# Patient Record
Sex: Male | Born: 1950
Health system: Southern US, Community
[De-identification: ages and names within clinical notes are randomized; demographics above are authoritative.]

## PROBLEM LIST (undated history)

## (undated) DIAGNOSIS — J309 Allergic rhinitis, unspecified: Secondary | ICD-10-CM

## (undated) DIAGNOSIS — I872 Venous insufficiency (chronic) (peripheral): Secondary | ICD-10-CM

## (undated) DIAGNOSIS — K760 Fatty (change of) liver, not elsewhere classified: Secondary | ICD-10-CM

## (undated) DIAGNOSIS — T39395A Adverse effect of other nonsteroidal anti-inflammatory drugs [NSAID], initial encounter: Secondary | ICD-10-CM

## (undated) DIAGNOSIS — H9319 Tinnitus, unspecified ear: Secondary | ICD-10-CM

## (undated) DIAGNOSIS — R569 Unspecified convulsions: Secondary | ICD-10-CM

## (undated) DIAGNOSIS — G8929 Other chronic pain: Secondary | ICD-10-CM

## (undated) DIAGNOSIS — G40909 Epilepsy, unspecified, not intractable, without status epilepticus: Secondary | ICD-10-CM

## (undated) DIAGNOSIS — G894 Chronic pain syndrome: Secondary | ICD-10-CM

## (undated) DIAGNOSIS — K859 Acute pancreatitis without necrosis or infection, unspecified: Secondary | ICD-10-CM

## (undated) DIAGNOSIS — I10 Essential (primary) hypertension: Secondary | ICD-10-CM

## (undated) DIAGNOSIS — J189 Pneumonia, unspecified organism: Secondary | ICD-10-CM

## (undated) DIAGNOSIS — E119 Type 2 diabetes mellitus without complications: Secondary | ICD-10-CM

## (undated) DIAGNOSIS — M5136 Other intervertebral disc degeneration, lumbar region: Secondary | ICD-10-CM

## (undated) DIAGNOSIS — N182 Chronic kidney disease, stage 2 (mild): Secondary | ICD-10-CM

## (undated) DIAGNOSIS — E1142 Type 2 diabetes mellitus with diabetic polyneuropathy: Secondary | ICD-10-CM

## (undated) DIAGNOSIS — F329 Major depressive disorder, single episode, unspecified: Secondary | ICD-10-CM

## (undated) DIAGNOSIS — N209 Urinary calculus, unspecified: Secondary | ICD-10-CM

## (undated) DIAGNOSIS — K5790 Diverticulosis of intestine, part unspecified, without perforation or abscess without bleeding: Secondary | ICD-10-CM

## (undated) DIAGNOSIS — E782 Mixed hyperlipidemia: Secondary | ICD-10-CM

## (undated) DIAGNOSIS — D472 Monoclonal gammopathy: Secondary | ICD-10-CM

## (undated) DIAGNOSIS — F419 Anxiety disorder, unspecified: Secondary | ICD-10-CM

## (undated) DIAGNOSIS — M51369 Other intervertebral disc degeneration, lumbar region without mention of lumbar back pain or lower extremity pain: Secondary | ICD-10-CM

## (undated) DIAGNOSIS — Z8669 Personal history of other diseases of the nervous system and sense organs: Secondary | ICD-10-CM

## (undated) DIAGNOSIS — M1990 Unspecified osteoarthritis, unspecified site: Secondary | ICD-10-CM

## (undated) DIAGNOSIS — M25569 Pain in unspecified knee: Secondary | ICD-10-CM

## (undated) DIAGNOSIS — F29 Unspecified psychosis not due to a substance or known physiological condition: Secondary | ICD-10-CM

## (undated) DIAGNOSIS — F32A Depression, unspecified: Secondary | ICD-10-CM

## (undated) DIAGNOSIS — K219 Gastro-esophageal reflux disease without esophagitis: Secondary | ICD-10-CM

## (undated) DIAGNOSIS — M549 Dorsalgia, unspecified: Secondary | ICD-10-CM

## (undated) DIAGNOSIS — M21371 Foot drop, right foot: Secondary | ICD-10-CM

## (undated) DIAGNOSIS — K2960 Other gastritis without bleeding: Secondary | ICD-10-CM

## (undated) HISTORY — DX: Personal history of other diseases of the nervous system and sense organs: Z86.69

## (undated) HISTORY — DX: Epilepsy, unspecified, not intractable, without status epilepticus: G40.909

## (undated) HISTORY — DX: Chronic kidney disease, stage 2 (mild): N18.2

## (undated) HISTORY — DX: Adverse effect of other nonsteroidal anti-inflammatory drugs (NSAID), initial encounter: T39.395A

## (undated) HISTORY — PX: BACK SURGERY: SHX140

## (undated) HISTORY — DX: Chronic pain syndrome: G89.4

## (undated) HISTORY — DX: Acute pancreatitis without necrosis or infection, unspecified: K85.90

## (undated) HISTORY — DX: Monoclonal gammopathy: D47.2

## (undated) HISTORY — DX: Mixed hyperlipidemia: E78.2

## (undated) HISTORY — DX: Foot drop, right foot: M21.371

## (undated) HISTORY — DX: Major depressive disorder, single episode, unspecified: F32.9

## (undated) HISTORY — PX: LUMBAR LAMINECTOMY: SHX95

## (undated) HISTORY — PX: OTHER SURGICAL HISTORY: SHX169

## (undated) HISTORY — DX: Other intervertebral disc degeneration, lumbar region without mention of lumbar back pain or lower extremity pain: M51.369

## (undated) HISTORY — DX: Other gastritis without bleeding: K29.60

## (undated) HISTORY — DX: Dorsalgia, unspecified: M54.9

## (undated) HISTORY — DX: Anxiety disorder, unspecified: F41.9

## (undated) HISTORY — DX: Fatty (change of) liver, not elsewhere classified: K76.0

## (undated) HISTORY — DX: Other intervertebral disc degeneration, lumbar region: M51.36

## (undated) HISTORY — DX: Venous insufficiency (chronic) (peripheral): I87.2

## (undated) HISTORY — DX: Type 2 diabetes mellitus with diabetic polyneuropathy: E11.42

## (undated) HISTORY — DX: Other chronic pain: G89.29

## (undated) HISTORY — DX: Unspecified osteoarthritis, unspecified site: M19.90

## (undated) HISTORY — DX: Allergic rhinitis, unspecified: J30.9

## (undated) HISTORY — DX: Tinnitus, unspecified ear: H93.19

## (undated) HISTORY — DX: Depression, unspecified: F32.A

## (undated) HISTORY — PX: COLONOSCOPY: SHX174

## (undated) HISTORY — DX: Type 2 diabetes mellitus without complications: E11.9

---
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
## 1998-04-16 ENCOUNTER — Inpatient Hospital Stay (HOSPITAL_COMMUNITY): Admission: RE | Admit: 1998-04-16 | Discharge: 1998-04-16 | Payer: Self-pay

## 2000-03-27 DIAGNOSIS — M21371 Foot drop, right foot: Secondary | ICD-10-CM

## 2000-03-27 DIAGNOSIS — R569 Unspecified convulsions: Secondary | ICD-10-CM

## 2000-03-27 DIAGNOSIS — G8929 Other chronic pain: Secondary | ICD-10-CM

## 2000-03-27 HISTORY — DX: Other chronic pain: G89.29

## 2000-03-27 HISTORY — DX: Unspecified convulsions: R56.9

## 2000-03-27 HISTORY — DX: Foot drop, right foot: M21.371

## 2000-08-17 ENCOUNTER — Emergency Department (HOSPITAL_COMMUNITY): Admission: EM | Admit: 2000-08-17 | Discharge: 2000-08-17 | Payer: Self-pay | Admitting: Internal Medicine

## 2000-08-23 ENCOUNTER — Emergency Department (HOSPITAL_COMMUNITY): Admission: EM | Admit: 2000-08-23 | Discharge: 2000-08-24 | Payer: Self-pay | Admitting: *Deleted

## 2000-09-20 ENCOUNTER — Emergency Department (HOSPITAL_COMMUNITY): Admission: EM | Admit: 2000-09-20 | Discharge: 2000-09-20 | Payer: Self-pay | Admitting: *Deleted

## 2000-10-27 ENCOUNTER — Emergency Department (HOSPITAL_COMMUNITY): Admission: EM | Admit: 2000-10-27 | Discharge: 2000-10-27 | Payer: Self-pay | Admitting: *Deleted

## 2000-10-27 ENCOUNTER — Encounter: Payer: Self-pay | Admitting: *Deleted

## 2000-11-13 ENCOUNTER — Ambulatory Visit (HOSPITAL_COMMUNITY): Admission: RE | Admit: 2000-11-13 | Discharge: 2000-11-13 | Payer: Self-pay

## 2000-11-26 ENCOUNTER — Emergency Department (HOSPITAL_COMMUNITY): Admission: EM | Admit: 2000-11-26 | Discharge: 2000-11-26 | Payer: Self-pay | Admitting: Emergency Medicine

## 2000-11-26 ENCOUNTER — Encounter: Payer: Self-pay | Admitting: *Deleted

## 2001-01-01 ENCOUNTER — Encounter (HOSPITAL_COMMUNITY): Admission: RE | Admit: 2001-01-01 | Discharge: 2001-01-31 | Payer: Self-pay | Admitting: Surgery

## 2001-01-18 ENCOUNTER — Ambulatory Visit (HOSPITAL_COMMUNITY): Admission: RE | Admit: 2001-01-18 | Discharge: 2001-01-18 | Payer: Self-pay | Admitting: Surgery

## 2001-04-05 ENCOUNTER — Ambulatory Visit (HOSPITAL_COMMUNITY): Admission: RE | Admit: 2001-04-05 | Discharge: 2001-04-05 | Payer: Self-pay | Admitting: Surgery

## 2001-04-05 ENCOUNTER — Encounter: Payer: Self-pay | Admitting: Surgery

## 2001-08-22 ENCOUNTER — Emergency Department (HOSPITAL_COMMUNITY): Admission: EM | Admit: 2001-08-22 | Discharge: 2001-08-22 | Payer: Self-pay | Admitting: Emergency Medicine

## 2002-05-06 ENCOUNTER — Emergency Department (HOSPITAL_COMMUNITY): Admission: EM | Admit: 2002-05-06 | Discharge: 2002-05-06 | Payer: Self-pay | Admitting: *Deleted

## 2005-01-04 ENCOUNTER — Emergency Department (HOSPITAL_COMMUNITY): Admission: EM | Admit: 2005-01-04 | Discharge: 2005-01-05 | Payer: Self-pay | Admitting: Emergency Medicine

## 2005-01-18 ENCOUNTER — Emergency Department (HOSPITAL_COMMUNITY): Admission: EM | Admit: 2005-01-18 | Discharge: 2005-01-18 | Payer: Self-pay | Admitting: Emergency Medicine

## 2006-10-28 ENCOUNTER — Emergency Department (HOSPITAL_COMMUNITY): Admission: EM | Admit: 2006-10-28 | Discharge: 2006-10-28 | Payer: Self-pay | Admitting: Emergency Medicine

## 2007-04-26 ENCOUNTER — Ambulatory Visit: Payer: Self-pay | Admitting: Internal Medicine

## 2007-04-26 DIAGNOSIS — G8929 Other chronic pain: Secondary | ICD-10-CM | POA: Insufficient documentation

## 2007-04-26 DIAGNOSIS — R569 Unspecified convulsions: Secondary | ICD-10-CM | POA: Insufficient documentation

## 2007-04-26 DIAGNOSIS — M545 Low back pain, unspecified: Secondary | ICD-10-CM | POA: Insufficient documentation

## 2007-04-26 DIAGNOSIS — I10 Essential (primary) hypertension: Secondary | ICD-10-CM | POA: Insufficient documentation

## 2007-04-26 DIAGNOSIS — F329 Major depressive disorder, single episode, unspecified: Secondary | ICD-10-CM | POA: Insufficient documentation

## 2007-04-29 ENCOUNTER — Encounter (INDEPENDENT_AMBULATORY_CARE_PROVIDER_SITE_OTHER): Payer: Self-pay | Admitting: Internal Medicine

## 2007-05-01 ENCOUNTER — Telehealth (INDEPENDENT_AMBULATORY_CARE_PROVIDER_SITE_OTHER): Payer: Self-pay | Admitting: Internal Medicine

## 2007-05-03 ENCOUNTER — Telehealth (INDEPENDENT_AMBULATORY_CARE_PROVIDER_SITE_OTHER): Payer: Self-pay | Admitting: Internal Medicine

## 2007-05-15 ENCOUNTER — Telehealth (INDEPENDENT_AMBULATORY_CARE_PROVIDER_SITE_OTHER): Payer: Self-pay | Admitting: *Deleted

## 2007-05-23 ENCOUNTER — Encounter (INDEPENDENT_AMBULATORY_CARE_PROVIDER_SITE_OTHER): Payer: Self-pay | Admitting: Internal Medicine

## 2007-05-23 ENCOUNTER — Telehealth (INDEPENDENT_AMBULATORY_CARE_PROVIDER_SITE_OTHER): Payer: Self-pay | Admitting: *Deleted

## 2007-05-29 ENCOUNTER — Encounter (INDEPENDENT_AMBULATORY_CARE_PROVIDER_SITE_OTHER): Payer: Self-pay | Admitting: Internal Medicine

## 2007-06-06 ENCOUNTER — Ambulatory Visit: Payer: Self-pay | Admitting: Internal Medicine

## 2007-06-11 ENCOUNTER — Telehealth (INDEPENDENT_AMBULATORY_CARE_PROVIDER_SITE_OTHER): Payer: Self-pay | Admitting: Internal Medicine

## 2007-06-11 ENCOUNTER — Encounter (INDEPENDENT_AMBULATORY_CARE_PROVIDER_SITE_OTHER): Payer: Self-pay | Admitting: Internal Medicine

## 2007-06-11 ENCOUNTER — Ambulatory Visit: Payer: Self-pay | Admitting: Pain Medicine

## 2007-06-17 ENCOUNTER — Ambulatory Visit: Payer: Self-pay | Admitting: Pain Medicine

## 2007-06-17 ENCOUNTER — Telehealth (INDEPENDENT_AMBULATORY_CARE_PROVIDER_SITE_OTHER): Payer: Self-pay | Admitting: *Deleted

## 2007-06-18 ENCOUNTER — Telehealth (INDEPENDENT_AMBULATORY_CARE_PROVIDER_SITE_OTHER): Payer: Self-pay | Admitting: *Deleted

## 2007-07-08 ENCOUNTER — Ambulatory Visit: Payer: Self-pay | Admitting: Pain Medicine

## 2007-08-21 ENCOUNTER — Ambulatory Visit: Payer: Self-pay | Admitting: Pain Medicine

## 2007-09-13 ENCOUNTER — Ambulatory Visit: Payer: Self-pay | Admitting: Internal Medicine

## 2007-10-31 ENCOUNTER — Ambulatory Visit: Payer: Self-pay | Admitting: Internal Medicine

## 2007-10-31 DIAGNOSIS — H6690 Otitis media, unspecified, unspecified ear: Secondary | ICD-10-CM | POA: Insufficient documentation

## 2007-10-31 DIAGNOSIS — J309 Allergic rhinitis, unspecified: Secondary | ICD-10-CM

## 2007-10-31 HISTORY — DX: Allergic rhinitis, unspecified: J30.9

## 2007-11-14 ENCOUNTER — Telehealth (INDEPENDENT_AMBULATORY_CARE_PROVIDER_SITE_OTHER): Payer: Self-pay | Admitting: *Deleted

## 2007-11-18 ENCOUNTER — Ambulatory Visit: Payer: Self-pay | Admitting: Internal Medicine

## 2007-11-18 DIAGNOSIS — H9319 Tinnitus, unspecified ear: Secondary | ICD-10-CM

## 2007-11-18 HISTORY — DX: Tinnitus, unspecified ear: H93.19

## 2008-04-28 ENCOUNTER — Encounter (INDEPENDENT_AMBULATORY_CARE_PROVIDER_SITE_OTHER): Payer: Self-pay | Admitting: Internal Medicine

## 2008-06-29 ENCOUNTER — Ambulatory Visit: Payer: Self-pay | Admitting: Internal Medicine

## 2008-06-30 ENCOUNTER — Encounter (INDEPENDENT_AMBULATORY_CARE_PROVIDER_SITE_OTHER): Payer: Self-pay | Admitting: Internal Medicine

## 2008-06-30 LAB — CONVERTED CEMR LAB
CO2: 26 meq/L (ref 19–32)
Calcium: 9.6 mg/dL (ref 8.4–10.5)
Cholesterol: 283 mg/dL — ABNORMAL HIGH (ref 0–200)
HDL: 46 mg/dL (ref >39)
Sodium: 140 meq/L (ref 135–145)
Total CHOL/HDL Ratio: 6.2

## 2008-09-25 ENCOUNTER — Encounter (INDEPENDENT_AMBULATORY_CARE_PROVIDER_SITE_OTHER): Payer: Self-pay | Admitting: Internal Medicine

## 2009-06-28 ENCOUNTER — Encounter (HOSPITAL_COMMUNITY): Admission: RE | Admit: 2009-06-28 | Discharge: 2009-07-28 | Payer: Self-pay | Admitting: Geriatric Medicine

## 2009-09-17 ENCOUNTER — Emergency Department (HOSPITAL_COMMUNITY): Admission: EM | Admit: 2009-09-17 | Discharge: 2009-09-17 | Payer: Self-pay | Admitting: Emergency Medicine

## 2011-01-09 LAB — CBC
Hemoglobin: 14.4
MCHC: 34.3
MCV: 96.7
RBC: 4.35

## 2011-01-09 LAB — DIFFERENTIAL
Basophils Relative: 0
Monocytes Absolute: 0.6
Monocytes Relative: 9
Neutro Abs: 3.9

## 2011-01-09 LAB — URINE CULTURE

## 2011-01-09 LAB — BASIC METABOLIC PANEL
CO2: 31
Calcium: 8.9
Chloride: 100
GFR calc Af Amer: >60
Potassium: 4
Sodium: 137

## 2011-01-09 LAB — URINALYSIS, ROUTINE W REFLEX MICROSCOPIC
Bilirubin Urine: NEGATIVE
Glucose, UA: NEGATIVE
Protein, ur: NEGATIVE

## 2011-04-28 DIAGNOSIS — T39395A Adverse effect of other nonsteroidal anti-inflammatory drugs [NSAID], initial encounter: Secondary | ICD-10-CM

## 2011-04-28 DIAGNOSIS — K2960 Other gastritis without bleeding: Secondary | ICD-10-CM

## 2011-04-28 HISTORY — DX: Other gastritis without bleeding: T39.395A

## 2011-04-28 HISTORY — DX: Other gastritis without bleeding: K29.60

## 2011-05-17 ENCOUNTER — Encounter (HOSPITAL_COMMUNITY): Payer: Self-pay

## 2011-05-17 ENCOUNTER — Emergency Department (HOSPITAL_COMMUNITY)
Admission: EM | Admit: 2011-05-17 | Discharge: 2011-05-17 | Disposition: A | Discharge status: Home / Self Care | Payer: Medicare Other | Attending: Emergency Medicine | Admitting: Emergency Medicine

## 2011-05-17 DIAGNOSIS — I10 Essential (primary) hypertension: Secondary | ICD-10-CM | POA: Insufficient documentation

## 2011-05-17 DIAGNOSIS — K2960 Other gastritis without bleeding: Secondary | ICD-10-CM | POA: Insufficient documentation

## 2011-05-17 HISTORY — DX: Essential (primary) hypertension: I10

## 2011-05-17 LAB — CBC
HCT: 44.9 % (ref 39.0–52.0)
Hemoglobin: 16.3 g/dL (ref 13.0–17.0)
MCH: 33.1 pg (ref 26.0–34.0)
MCHC: 36.3 g/dL — ABNORMAL HIGH (ref 30.0–36.0)
MCV: 91.1 fL (ref 78.0–100.0)
RBC: 4.93 MIL/uL (ref 4.22–5.81)

## 2011-05-17 LAB — DIFFERENTIAL
Basophils Relative: 0 % (ref 0–1)
Eosinophils Absolute: 0 10*3/uL (ref 0.0–0.7)
Eosinophils Relative: 0 % (ref 0–5)
Lymphs Abs: 0.8 10*3/uL (ref 0.7–4.0)
Monocytes Absolute: 0.3 10*3/uL (ref 0.1–1.0)
Monocytes Relative: 4 % (ref 3–12)

## 2011-05-17 LAB — COMPREHENSIVE METABOLIC PANEL
Albumin: 4.5 g/dL (ref 3.5–5.2)
Alkaline Phosphatase: 72 U/L (ref 39–117)
BUN: 21 mg/dL (ref 6–23)
Creatinine, Ser: 1.11 mg/dL (ref 0.50–1.35)
GFR calc Af Amer: 83 mL/min — ABNORMAL LOW (ref >90)
Glucose, Bld: 178 mg/dL — ABNORMAL HIGH (ref 70–99)
Total Bilirubin: 0.6 mg/dL (ref 0.3–1.2)
Total Protein: 8.7 g/dL — ABNORMAL HIGH (ref 6.0–8.3)

## 2011-05-17 MED ORDER — ONDANSETRON 4 MG PO TBDP: 4.0000 mg | ORAL_TABLET | Freq: Three times a day (TID) | ORAL | Status: AC | PRN

## 2011-05-17 MED ORDER — ONDANSETRON HCL 4 MG/2ML IJ SOLN
INTRAMUSCULAR | Status: AC
  Administered 2011-05-17: 4 mg via INTRAVENOUS
  Filled 2011-05-17: qty 2

## 2011-05-17 MED ORDER — FENTANYL CITRATE 0.05 MG/ML IJ SOLN
100.0000 ug | Freq: Once | INTRAMUSCULAR | Status: AC
  Administered 2011-05-17: 100 ug via INTRAVENOUS
  Filled 2011-05-17: qty 2

## 2011-05-17 MED ORDER — ONDANSETRON HCL 4 MG/2ML IJ SOLN
4.0000 mg | Freq: Once | INTRAMUSCULAR | Status: AC
  Administered 2011-05-17: 4 mg via INTRAVENOUS

## 2011-05-17 MED ORDER — PANTOPRAZOLE SODIUM 40 MG IV SOLR
40.0000 mg | Freq: Once | INTRAVENOUS | Status: AC
  Administered 2011-05-17: 40 mg via INTRAVENOUS
  Filled 2011-05-17: qty 40

## 2011-05-17 MED ORDER — ONDANSETRON HCL 4 MG/2ML IJ SOLN
4.0000 mg | Freq: Once | INTRAMUSCULAR | Status: AC
  Administered 2011-05-17: 4 mg via INTRAVENOUS
  Filled 2011-05-17: qty 2

## 2011-05-17 MED ORDER — SODIUM CHLORIDE 0.9 % IV BOLUS (SEPSIS)
1000.0000 mL | Freq: Once | INTRAVENOUS | Status: AC
  Administered 2011-05-17: 1000 mL via INTRAVENOUS

## 2011-05-17 MED ORDER — SUCRALFATE 1 G PO TABS: 1.0000 g | ORAL_TABLET | Freq: Three times a day (TID) | ORAL | Status: DC

## 2011-05-17 MED ORDER — SUCRALFATE 1 G PO TABS
1.0000 g | ORAL_TABLET | Freq: Three times a day (TID) | ORAL | Status: DC
  Filled 2011-05-17 (×8): qty 1

## 2011-05-17 MED ORDER — PANTOPRAZOLE SODIUM 20 MG PO TBEC: 40.0000 mg | DELAYED_RELEASE_TABLET | Freq: Every day | ORAL | Status: DC

## 2011-05-17 NOTE — Discharge Instructions (Signed)
Gastritis °Gastritis is an inflammation (the body's way of reacting to injury and/or infection) of the stomach. It is often caused by viral or bacterial (germ) infections. It can also be caused by chemicals (including alcohol) and medications. This illness may be associated with generalized malaise (feeling tired, not well), cramps, and fever. The illness may last 2 to 7 days. If symptoms of gastritis continue, gastroscopy (looking into the stomach with a telescope-like instrument), biopsy (taking tissue samples), and/or blood tests may be necessary to determine the cause. Antibiotics will not affect the illness unless there is a bacterial infection present. One common bacterial cause of gastritis is an organism known as H. Pylori. This can be treated with antibiotics. Other forms of gastritis are caused by too much acid in the stomach. They can be treated with medications such as H2 blockers and antacids. Home treatment is usually all that is needed. Young children will quickly become dehydrated (loss of body fluids) if vomiting and diarrhea are both present. Medications may be given to control nausea. Medications are usually not given for diarrhea unless especially bothersome. Some medications slow the removal of the virus from the gastrointestinal tract. This slows down the healing process. °HOME CARE INSTRUCTIONS °Home care instructions for nausea and vomiting: °· For adults: drink small amounts of fluids often. Drink at least 2 quarts a day. Take sips frequently. Do not drink large amounts of fluid at one time. This may worsen the nausea.  °· Only take over-the-counter or prescription medicines for pain, discomfort, or fever as directed by your caregiver.  °· Drink clear liquids only. Those are anything you can see through such as water, broth, or soft drinks.  °· Once you are keeping clear liquids down, you may start full liquids, soups, juices, and ice cream or sherbet. Slowly add bland (plain, not spicy)  foods to your diet.  °Home care instructions for diarrhea: °· Diarrhea can be caused by bacterial infections or a virus. Your condition should improve with time, rest, fluids, and/or anti-diarrheal medication.  °· Until your diarrhea is under control, you should drink clear liquids often in small amounts. Clear liquids include: water, broth, jell-o water and weak tea.  °Avoid: °· Milk.  °· Fruits.  °· Tobacco.  °· Alcohol.  °· Extremely hot or cold fluids.  °· Too much intake of anything at one time.  °When your diarrhea stops you may add the following foods, which help the stool to become more formed: °· Rice.  °· Bananas.  °· Apples without skin.  °· Dry toast.  °Once these foods are tolerated you may add low-fat yogurt and low-fat cottage cheese. They will help to restore the normal bacterial balance in your bowel. °Wash your hands well to avoid spreading bacteria (germ) or virus. °SEEK IMMEDIATE MEDICAL CARE IF:  °· You are unable to keep fluids down.  °· Vomiting or diarrhea become persistent (constant).  °· Abdominal pain develops, increases, or localizes. (Right sided pain can be appendicitis. Left sided pain in adults can be diverticulitis.)  °· You develop a fever (an oral temperature above 102° F (38.9° C)).  °· Diarrhea becomes excessive or contains blood or mucus.  °· You have excessive weakness, dizziness, fainting or extreme thirst.  °· You are not improving or you are getting worse.  °· You have any other questions or concerns.  °Document Released: 03/07/2001 Document Revised: 11/23/2010 Document Reviewed: 03/13/2005 °ExitCare® Patient Information ©2012 [employer]. °

## 2011-05-17 NOTE — ED Provider Notes (Signed)
History     CSN: [id number]  Arrival date & time 05/17/11  1331   First MD Initiated Contact with Patient 05/17/11 1347      Chief Complaint  Patient presents with  . Nausea  . Emesis  . Diarrhea    (Consider location/radiation/quality/duration/timing/severity/associated sxs/prior treatment) HPI Patient presents with chief complaint of nausea and vomiting associated with epigastric discomfort which started around 6 to 7:00 this morning.  Patient had small amount of diarrhea but most of the complaint is nausea vomiting and epigastric discomfort.  Patient denies vomiting up blood.  Most of material he vomited consisted of bowel.  Patient has no past medical history of surgeries.  Patient denies fever or or chills.  Patient also denies history of ulcers, reflux, gastritis.  Patient denies alcohol intake.  Patient denies chest pain. Past Medical History  Diagnosis Date  . Hypertension     Past Surgical History  Procedure Date  . Back surgery     No family history on file.  History  Substance Use Topics  . Smoking status: Never Smoker   . Smokeless tobacco: Not on file  . Alcohol Use: No      Review of Systems  All other systems reviewed and are negative.    Allergies  Codeine  Home Medications   Current Outpatient Rx  Name Route Sig Dispense Refill  . DULOXETINE HCL 60 MG PO CPEP Oral Take 60 mg by mouth daily.    Marland Kitchen HYDROCHLOROTHIAZIDE 25 MG PO TABS Oral Take 25 mg by mouth daily.    Marland Kitchen LISINOPRIL 5 MG PO TABS Oral Take 5 mg by mouth daily.    Marland Kitchen METOPROLOL TARTRATE 100 MG PO TABS Oral Take 100 mg by mouth daily.    . MORPHINE SULFATE ER 15 MG PO TB12 Oral Take 15 mg by mouth 3 (three) times daily.    . OXYCODONE HCL 5 MG PO TABS Oral Take 5 mg by mouth every 8 (eight) hours as needed. FOR BREAK THROUGH PAIN    . ONDANSETRON 4 MG PO TBDP Oral Take 1 tablet (4 mg total) by mouth every 8 (eight) hours as needed for nausea. 20 tablet 0  . PANTOPRAZOLE SODIUM 20 MG PO  TBEC Oral Take 2 tablets (40 mg total) by mouth daily. 30 tablet 0  . SUCRALFATE 1 G PO TABS Oral Take 1 tablet (1 g total) by mouth 4 (four) times daily -  with meals and at bedtime. 20 tablet 0    BP 155/86  Pulse 100  Temp(Src) 98.3 F (36.8 C) (Oral)  Resp 20  Ht 5\' 11"  (1.803 m)  Wt 253 lb (114.76 kg)  BMI 35.29 kg/m2  SpO2 95%  Physical Exam  Nursing note and vitals reviewed. Constitutional: He is oriented to person, place, and time. He appears well-developed and well-nourished. No distress.  HENT:  Head: Normocephalic and atraumatic.  Eyes: Pupils are equal, round, and reactive to light.  Neck: Normal range of motion.  Cardiovascular: Normal rate and intact distal pulses.   Pulmonary/Chest: No respiratory distress.  Abdominal: Soft. Normal appearance and bowel sounds are normal. He exhibits no distension and no abdominal bruit. There is no tenderness.       Bedside ultrasound did not reveal any cholelithiasis.  Musculoskeletal: Normal range of motion.  Neurological: He is alert and oriented to person, place, and time. No cranial nerve deficit.  Skin: Skin is warm and dry. No rash noted.  Psychiatric: He has a normal mood  and affect. His behavior is normal.    ED Course  Procedures (including critical care time)   Scheduled Meds:    . fentaNYL  100 mcg Intravenous Once  . ondansetron  4 mg Intravenous Once  . ondansetron  4 mg Intravenous Once  . pantoprazole (PROTONIX) IV  40 mg Intravenous Once  . sodium chloride  1,000 mL Intravenous Once  . sucralfate  1 g Oral TID WC & HS     Labs Reviewed  CBC - Abnormal; Notable for the following:    MCHC 36.3 (*)    All other components within normal limits  DIFFERENTIAL - Abnormal; Notable for the following:    Neutrophils Relative 88 (*)    Neutro Abs 8.7 (*)    Lymphocytes Relative 8 (*)    All other components within normal limits  COMPREHENSIVE METABOLIC PANEL - Abnormal; Notable for the following:     Potassium 2.8 (*)    Chloride 94 (*)    Glucose, Bld 178 (*)    Total Protein 8.7 (*)    GFR calc non Af Amer 71 (*)    GFR calc Af Amer 83 (*)    All other components within normal limits  LIPASE, BLOOD   No results found.   1. Gastritis due to nonsteroidal anti-inflammatory drug       MDM         Nelia Shi, MD 05/17/11 [phone]

## 2011-05-17 NOTE — ED Notes (Signed)
Pt reports n/v/d since 0630 this a.m.  Pt reports abd pain to the center of his abdomen.

## 2011-10-17 ENCOUNTER — Encounter: Payer: Self-pay | Admitting: Family Medicine

## 2011-10-17 ENCOUNTER — Ambulatory Visit (INDEPENDENT_AMBULATORY_CARE_PROVIDER_SITE_OTHER): Payer: Medicare Other | Admitting: Family Medicine

## 2011-10-17 VITALS — BP 120/81 | HR 71 | Temp 97.4°F | Ht 71.0 in | Wt 249.0 lb

## 2011-10-17 DIAGNOSIS — F411 Generalized anxiety disorder: Secondary | ICD-10-CM

## 2011-10-17 DIAGNOSIS — M545 Low back pain, unspecified: Secondary | ICD-10-CM

## 2011-10-17 DIAGNOSIS — G8929 Other chronic pain: Secondary | ICD-10-CM

## 2011-10-17 DIAGNOSIS — M1710 Unilateral primary osteoarthritis, unspecified knee: Secondary | ICD-10-CM

## 2011-10-17 DIAGNOSIS — M170 Bilateral primary osteoarthritis of knee: Secondary | ICD-10-CM | POA: Insufficient documentation

## 2011-10-17 DIAGNOSIS — I10 Essential (primary) hypertension: Secondary | ICD-10-CM

## 2011-10-17 DIAGNOSIS — IMO0002 Reserved for concepts with insufficient information to code with codable children: Secondary | ICD-10-CM

## 2011-10-17 MED ORDER — LISINOPRIL 5 MG PO TABS: 5.0000 mg | ORAL_TABLET | Freq: Every day | ORAL | Status: DC

## 2011-10-17 MED ORDER — CLONAZEPAM 1 MG PO TABS: 1.0000 mg | ORAL_TABLET | Freq: Two times a day (BID) | ORAL | Status: DC | PRN
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
## 2011-10-17 NOTE — Assessment & Plan Note (Signed)
This has complicated his pain control, but recent adjustment of MS Contin to 15mg  tid has stabilized this and he usually takes only 1-2 breakthrough pain pills per day.  The plan is for him to get left TKR in the next 4-6 wks with Dr. Eulah Pont.
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
## 2011-10-17 NOTE — Assessment & Plan Note (Addendum)
Lots of extraneous circumstances in the last 1-2 yrs, hopefully going to get better soon. I increased his clonazepam to 1mg  dose, 1 bid prn, #60, RF x 1. Continue cymbalta.

## 2011-10-17 NOTE — Assessment & Plan Note (Signed)
Problem stable.  Continue current medications and diet appropriate for this condition.  We have reviewed our general long term plan for this problem and also reviewed symptoms and signs that should prompt the patient to call or return to the office. Restart ASA 81mg  qd for cardioprotection--take with food.  If dose of celebrex (or other NSAIDs) is increased, then we'll get him off of his ASA at least temporarily.

## 2011-10-30 ENCOUNTER — Ambulatory Visit: Payer: Medicare Other | Admitting: Family Medicine

## 2011-11-08 ENCOUNTER — Ambulatory Visit: Payer: Medicare Other | Admitting: Family Medicine

## 2011-11-14 ENCOUNTER — Telehealth: Payer: Self-pay | Admitting: Family Medicine

## 2011-11-14 DIAGNOSIS — E876 Hypokalemia: Secondary | ICD-10-CM

## 2011-11-14 DIAGNOSIS — R739 Hyperglycemia, unspecified: Secondary | ICD-10-CM

## 2011-11-14 NOTE — Telephone Encounter (Signed)
Pls contact pt and tell him I'm in the process of doing medical clearance for his upcoming knee surgery and I need him to come in and get a couple of blood tests to follow up the abnormalities he had in the ER in February this year--he needs a fasting BMET (lab visit only).  I'll place order.--thx

## 2011-11-15 ENCOUNTER — Telehealth: Payer: Self-pay | Admitting: Family Medicine

## 2011-11-15 NOTE — Telephone Encounter (Signed)
Patient needs referral to Dr Eulah Pont with [facility] for left knee replacement. Please contact patient about pain med contract he needs refills for pain meds.

## 2011-11-15 NOTE — Telephone Encounter (Signed)
Pt will have OV on Friday to discuss pain meds and pain contract.  Pt will need fasting BMET at that time.

## 2011-11-15 NOTE — Telephone Encounter (Signed)
Pt will have drawn at OV on Friday.

## 2011-11-17 ENCOUNTER — Ambulatory Visit (INDEPENDENT_AMBULATORY_CARE_PROVIDER_SITE_OTHER): Payer: Medicare Other | Admitting: Family Medicine

## 2011-11-17 ENCOUNTER — Ambulatory Visit: Payer: Medicare Other | Admitting: Family Medicine

## 2011-11-17 ENCOUNTER — Encounter: Payer: Self-pay | Admitting: *Deleted

## 2011-11-17 ENCOUNTER — Encounter: Payer: Self-pay | Admitting: Family Medicine

## 2011-11-17 VITALS — BP 112/72 | HR 70 | Ht 71.0 in | Wt 247.0 lb

## 2011-11-17 DIAGNOSIS — M170 Bilateral primary osteoarthritis of knee: Secondary | ICD-10-CM

## 2011-11-17 DIAGNOSIS — IMO0002 Reserved for concepts with insufficient information to code with codable children: Secondary | ICD-10-CM

## 2011-11-17 DIAGNOSIS — M1710 Unilateral primary osteoarthritis, unspecified knee: Secondary | ICD-10-CM

## 2011-11-17 DIAGNOSIS — E876 Hypokalemia: Secondary | ICD-10-CM

## 2011-11-17 DIAGNOSIS — I10 Essential (primary) hypertension: Secondary | ICD-10-CM

## 2011-11-17 LAB — CBC WITH DIFFERENTIAL/PLATELET
Basophils Relative: 0.8 % (ref 0.0–3.0)
HCT: 43.3 % (ref 39.0–52.0)
Hemoglobin: 14.4 g/dL (ref 13.0–17.0)
Lymphocytes Relative: 24.8 % (ref 12.0–46.0)
Lymphs Abs: 1.9 10*3/uL (ref 0.7–4.0)
Monocytes Relative: 11.7 % (ref 3.0–12.0)
Neutro Abs: 4.7 10*3/uL (ref 1.4–7.7)
RBC: 4.53 Mil/uL (ref 4.22–5.81)
RDW: 13.2 % (ref 11.5–14.6)

## 2011-11-17 LAB — BASIC METABOLIC PANEL
CO2: 34 mEq/L — ABNORMAL HIGH (ref 19–32)
Calcium: 9.7 mg/dL (ref 8.4–10.5)
GFR: 49.75 mL/min — ABNORMAL LOW (ref >60.00)
Glucose, Bld: 95 mg/dL (ref 70–99)
Potassium: 4.5 mEq/L (ref 3.5–5.1)
Sodium: 138 mEq/L (ref 135–145)

## 2011-11-17 MED ORDER — MORPHINE SULFATE ER 30 MG PO TBCR: EXTENDED_RELEASE_TABLET | ORAL | Status: DC

## 2011-11-17 MED ORDER — MORPHINE SULFATE ER 30 MG PO TBCR: 30.0000 mg | EXTENDED_RELEASE_TABLET | Freq: Two times a day (BID) | ORAL | Status: DC

## 2011-11-17 MED ORDER — HYDROCHLOROTHIAZIDE 25 MG PO TABS: 25.0000 mg | ORAL_TABLET | Freq: Every day | ORAL | Status: DC

## 2011-11-17 MED ORDER — OXYCODONE HCL 5 MG PO TABS: 5.0000 mg | ORAL_TABLET | Freq: Three times a day (TID) | ORAL | Status: DC | PRN

## 2011-11-17 MED ORDER — AMLODIPINE BESYLATE 10 MG PO TABS: 10.0000 mg | ORAL_TABLET | Freq: Every day | ORAL | Status: DC

## 2011-11-17 NOTE — Assessment & Plan Note (Signed)
Problem stable.  Continue current medications and diet appropriate for this condition.  We have reviewed our general long term plan for this problem and also reviewed symptoms and signs that should prompt the patient to call or return to the office. He had an acute GI illness 04/2011 and went to the ED, where a metabolic panel showed hypokalemia.  I'll recheck a metabolic panel today as well as a CBC.
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
## 2011-11-17 NOTE — Assessment & Plan Note (Signed)
Plus chronic pain in L-spine: pain contract signed today. Rx for MS contin 30 mg q8h, #90, no RF given to patient along with #30 of the 5mg  oxycodone for q6h prn breakthrough pain. The initial MS Contin rx I printed today had the wrong sig ("take bid") so I marked through this one and initialled it and printed a new rx with the correct sig. Hopefully he can ween down off of this some after his upcoming TKR.

## 2011-11-20 ENCOUNTER — Encounter: Payer: Self-pay | Admitting: Family Medicine

## 2011-12-01 ENCOUNTER — Telehealth: Payer: Self-pay | Admitting: Family Medicine

## 2011-12-01 NOTE — Telephone Encounter (Signed)
Patient said Richard Levine [name] has not received the medical release for his knee surgery, have we received any paperwork from their office? If not the patient's been talking to Robie Ridge at their office, she can send the paperwork.

## 2011-12-01 NOTE — Telephone Encounter (Signed)
Paperwork faxed, but I will fax again.

## 2011-12-04 ENCOUNTER — Other Ambulatory Visit: Payer: Self-pay | Admitting: *Deleted

## 2011-12-04 NOTE — Telephone Encounter (Signed)
Pt was given RX on 10/17/11 for #60 x 1.  Per Mellody Dance at [facility] pt filled/picked up on 10/17/11 and 11/09/11.  Refill is too early.  Refill denied with Mellody Dance.

## 2011-12-06 ENCOUNTER — Other Ambulatory Visit: Payer: Self-pay | Admitting: Family Medicine

## 2011-12-06 ENCOUNTER — Telehealth: Payer: Self-pay | Admitting: *Deleted

## 2011-12-06 MED ORDER — OXYCODONE HCL 5 MG PO TABS: ORAL_TABLET | ORAL | Status: DC

## 2011-12-06 NOTE — Telephone Encounter (Signed)
Pt is no longer taking Celebrex and wants to know if he should continue Protonix.  Pt is not having any GERD symptoms.  Please advise.  Pt's knee surgery will be 12/27/11.

## 2011-12-06 NOTE — Telephone Encounter (Signed)
He can stop protonix daily and just use it as needed.-thx

## 2011-12-07 ENCOUNTER — Other Ambulatory Visit: Payer: Self-pay | Admitting: *Deleted

## 2011-12-07 MED ORDER — CLONAZEPAM 1 MG PO TABS: 1.0000 mg | ORAL_TABLET | Freq: Two times a day (BID) | ORAL | Status: DC | PRN

## 2011-12-07 NOTE — Telephone Encounter (Signed)
Walk-In note filled out by wife stating that pt needs refill on Clonazepam and pharmacy has requested this RX twice.   Faxed request was received on 12/04/11.  See note. Francee Piccolo, CMA 12/04/2011 10:30 AM Signed  Pt was given RX on 10/17/11 for #60 x 1. Per Mellody Dance at [facility] pt filled/picked up on 10/17/11 and 11/09/11. Refill is too early. Refill denied with Mellody Dance.  2nd faxed request recevied on 12/06/11 and this was also denied as too early.  Form faxed back to pharmacy.  I spoke to wife and she states that pt has been out for 2 days now.  Explained that we have received requests and we have denied these as it is too early.  Explained pt should not need refill until ~9/23 if he is taking as prescribed. Rosalita Chessman states that this is a stressful time and he has been taking more than prescribed.  States dose was increased to 1mg  from 0.5 mg.  Wants to know if maybe he needs to go back to 0.5 mg and take TID.  Please advise.

## 2011-12-07 NOTE — Telephone Encounter (Signed)
OK, I'll authorize clonazepam RF this time but tell him that he must take the med ONLY AS PRESCRIBED.  No further early RFs in the future.  Also, please make sure he has f/u scheduled sometime in November to f/u chronic pain meds use.-thx

## 2011-12-07 NOTE — Telephone Encounter (Signed)
Faxed RX 

## 2011-12-07 NOTE — Telephone Encounter (Signed)
Pt notified.  Also notified Oxycodone RX ready at front desk.  Reminded to bring ID.

## 2011-12-13 ENCOUNTER — Encounter (HOSPITAL_COMMUNITY): Payer: Self-pay | Admitting: Emergency Medicine

## 2011-12-13 ENCOUNTER — Inpatient Hospital Stay (HOSPITAL_COMMUNITY)
Admission: EM | Admit: 2011-12-13 | Discharge: 2011-12-15 | DRG: 440 | Disposition: A | Discharge status: Home / Self Care | Payer: Medicare Other | Attending: Internal Medicine | Admitting: Internal Medicine

## 2011-12-13 DIAGNOSIS — N182 Chronic kidney disease, stage 2 (mild): Secondary | ICD-10-CM | POA: Diagnosis present

## 2011-12-13 DIAGNOSIS — H6120 Impacted cerumen, unspecified ear: Secondary | ICD-10-CM

## 2011-12-13 DIAGNOSIS — I129 Hypertensive chronic kidney disease with stage 1 through stage 4 chronic kidney disease, or unspecified chronic kidney disease: Secondary | ICD-10-CM | POA: Diagnosis present

## 2011-12-13 DIAGNOSIS — M545 Low back pain: Secondary | ICD-10-CM

## 2011-12-13 DIAGNOSIS — H9319 Tinnitus, unspecified ear: Secondary | ICD-10-CM

## 2011-12-13 DIAGNOSIS — R112 Nausea with vomiting, unspecified: Secondary | ICD-10-CM

## 2011-12-13 DIAGNOSIS — I10 Essential (primary) hypertension: Secondary | ICD-10-CM

## 2011-12-13 DIAGNOSIS — E876 Hypokalemia: Secondary | ICD-10-CM | POA: Diagnosis present

## 2011-12-13 DIAGNOSIS — J309 Allergic rhinitis, unspecified: Secondary | ICD-10-CM

## 2011-12-13 DIAGNOSIS — F411 Generalized anxiety disorder: Secondary | ICD-10-CM | POA: Diagnosis present

## 2011-12-13 DIAGNOSIS — M170 Bilateral primary osteoarthritis of knee: Secondary | ICD-10-CM

## 2011-12-13 DIAGNOSIS — R635 Abnormal weight gain: Secondary | ICD-10-CM

## 2011-12-13 DIAGNOSIS — Z8249 Family history of ischemic heart disease and other diseases of the circulatory system: Secondary | ICD-10-CM

## 2011-12-13 DIAGNOSIS — G8929 Other chronic pain: Secondary | ICD-10-CM

## 2011-12-13 DIAGNOSIS — M129 Arthropathy, unspecified: Secondary | ICD-10-CM

## 2011-12-13 DIAGNOSIS — R7309 Other abnormal glucose: Secondary | ICD-10-CM | POA: Diagnosis present

## 2011-12-13 DIAGNOSIS — K859 Acute pancreatitis without necrosis or infection, unspecified: Principal | ICD-10-CM | POA: Diagnosis present

## 2011-12-13 DIAGNOSIS — F329 Major depressive disorder, single episode, unspecified: Secondary | ICD-10-CM

## 2011-12-13 DIAGNOSIS — E119 Type 2 diabetes mellitus without complications: Secondary | ICD-10-CM

## 2011-12-13 DIAGNOSIS — M1710 Unilateral primary osteoarthritis, unspecified knee: Secondary | ICD-10-CM | POA: Diagnosis present

## 2011-12-13 DIAGNOSIS — R739 Hyperglycemia, unspecified: Secondary | ICD-10-CM

## 2011-12-13 DIAGNOSIS — F3289 Other specified depressive episodes: Secondary | ICD-10-CM | POA: Diagnosis present

## 2011-12-13 DIAGNOSIS — Z87898 Personal history of other specified conditions: Secondary | ICD-10-CM

## 2011-12-13 HISTORY — DX: Unspecified convulsions: R56.9

## 2011-12-13 HISTORY — DX: Type 2 diabetes mellitus without complications: E11.9

## 2011-12-13 LAB — CBC WITH DIFFERENTIAL/PLATELET
Basophils Absolute: 0 10*3/uL (ref 0.0–0.1)
Eosinophils Absolute: 0 10*3/uL (ref 0.0–0.7)
Eosinophils Relative: 0 % (ref 0–5)
Lymphocytes Relative: 10 % — ABNORMAL LOW (ref 12–46)
Lymphs Abs: 1.1 10*3/uL (ref 0.7–4.0)
MCH: 31.2 pg (ref 26.0–34.0)
MCV: 90 fL (ref 78.0–100.0)
Neutrophils Relative %: 84 % — ABNORMAL HIGH (ref 43–77)
Platelets: 286 10*3/uL (ref 150–400)
RBC: 5.09 MIL/uL (ref 4.22–5.81)
RDW: 12.7 % (ref 11.5–15.5)
WBC: 11 10*3/uL — ABNORMAL HIGH (ref 4.0–10.5)

## 2011-12-13 LAB — COMPREHENSIVE METABOLIC PANEL
ALT: 19 U/L (ref 0–53)
AST: 15 U/L (ref 0–37)
Albumin: 4.5 g/dL (ref 3.5–5.2)
Alkaline Phosphatase: 82 U/L (ref 39–117)
Calcium: 10.2 mg/dL (ref 8.4–10.5)
Potassium: 2.7 mEq/L — CL (ref 3.5–5.1)
Sodium: 139 mEq/L (ref 135–145)
Total Protein: 8.7 g/dL — ABNORMAL HIGH (ref 6.0–8.3)

## 2011-12-13 MED ORDER — PANTOPRAZOLE SODIUM 40 MG IV SOLR
40.0000 mg | Freq: Once | INTRAVENOUS | Status: AC
  Administered 2011-12-13: 40 mg via INTRAVENOUS
  Filled 2011-12-13: qty 40

## 2011-12-13 MED ORDER — METOPROLOL SUCCINATE ER 50 MG PO TB24
100.0000 mg | ORAL_TABLET | Freq: Every day | ORAL | Status: DC
  Administered 2011-12-14 – 2011-12-15 (×2): 100 mg via ORAL
  Filled 2011-12-13 (×2): qty 2

## 2011-12-13 MED ORDER — PANTOPRAZOLE SODIUM 40 MG IV SOLR
40.0000 mg | Freq: Two times a day (BID) | INTRAVENOUS | Status: DC
  Administered 2011-12-13 – 2011-12-14 (×2): 40 mg via INTRAVENOUS
  Filled 2011-12-13 (×2): qty 40

## 2011-12-13 MED ORDER — POTASSIUM CHLORIDE 10 MEQ/100ML IV SOLN
10.0000 meq | INTRAVENOUS | Status: AC
  Administered 2011-12-13 (×4): 10 meq via INTRAVENOUS
  Filled 2011-12-13: qty 400

## 2011-12-13 MED ORDER — SODIUM CHLORIDE 0.9 % IV SOLN: INTRAVENOUS | Status: DC

## 2011-12-13 MED ORDER — ONDANSETRON HCL 4 MG/2ML IJ SOLN: 4.0000 mg | Freq: Four times a day (QID) | INTRAMUSCULAR | Status: DC | PRN

## 2011-12-13 MED ORDER — POTASSIUM CHLORIDE IN NACL 40-0.9 MEQ/L-% IV SOLN
INTRAVENOUS | Status: DC
  Administered 2011-12-13: 150 mL/h via INTRAVENOUS
  Administered 2011-12-14: 10:00:00 via INTRAVENOUS
  Filled 2011-12-13 (×9): qty 1000

## 2011-12-13 MED ORDER — SODIUM CHLORIDE 0.9 % IV SOLN
1000.0000 mL | Freq: Once | INTRAVENOUS | Status: AC
  Administered 2011-12-13: 1000 mL via INTRAVENOUS

## 2011-12-13 MED ORDER — ENOXAPARIN SODIUM 40 MG/0.4ML ~~LOC~~ SOLN
40.0000 mg | SUBCUTANEOUS | Status: DC
  Administered 2011-12-13 – 2011-12-14 (×2): 40 mg via SUBCUTANEOUS
  Filled 2011-12-13 (×2): qty 0.4

## 2011-12-13 MED ORDER — ONDANSETRON HCL 4 MG PO TABS: 4.0000 mg | ORAL_TABLET | Freq: Four times a day (QID) | ORAL | Status: DC | PRN

## 2011-12-13 MED ORDER — SODIUM CHLORIDE 0.9 % IV BOLUS (SEPSIS)
1000.0000 mL | Freq: Once | INTRAVENOUS | Status: DC
  Administered 2011-12-13: 1000 mL via INTRAVENOUS

## 2011-12-13 MED ORDER — ONDANSETRON HCL 4 MG/2ML IJ SOLN: 4.0000 mg | Freq: Three times a day (TID) | INTRAMUSCULAR | Status: DC | PRN

## 2011-12-13 MED ORDER — HYDROMORPHONE HCL PF 1 MG/ML IJ SOLN
1.0000 mg | INTRAMUSCULAR | Status: DC | PRN
  Administered 2011-12-13 – 2011-12-15 (×13): 1 mg via INTRAVENOUS
  Filled 2011-12-13 (×13): qty 1

## 2011-12-13 MED ORDER — HYDROMORPHONE HCL PF 1 MG/ML IJ SOLN
1.0000 mg | Freq: Once | INTRAMUSCULAR | Status: AC
  Administered 2011-12-13: 1 mg via INTRAVENOUS
  Filled 2011-12-13: qty 1

## 2011-12-13 MED ORDER — POTASSIUM CHLORIDE 10 MEQ/100ML IV SOLN
10.0000 meq | Freq: Once | INTRAVENOUS | Status: AC
  Administered 2011-12-13: 10 meq via INTRAVENOUS
  Filled 2011-12-13: qty 100

## 2011-12-13 MED ORDER — CLONAZEPAM 0.5 MG PO TABS
1.0000 mg | ORAL_TABLET | Freq: Two times a day (BID) | ORAL | Status: DC | PRN
  Administered 2011-12-13 – 2011-12-15 (×3): 1 mg via ORAL
  Filled 2011-12-13 (×2): qty 1
  Filled 2011-12-13: qty 2
  Filled 2011-12-13 (×2): qty 1

## 2011-12-13 MED ORDER — POTASSIUM CHLORIDE CRYS ER 20 MEQ PO TBCR
40.0000 meq | EXTENDED_RELEASE_TABLET | Freq: Once | ORAL | Status: AC
  Administered 2011-12-13: 40 meq via ORAL
  Filled 2011-12-13: qty 2

## 2011-12-13 MED ORDER — ONDANSETRON HCL 4 MG/2ML IJ SOLN
4.0000 mg | Freq: Once | INTRAMUSCULAR | Status: AC
  Administered 2011-12-13: 4 mg via INTRAVENOUS
  Filled 2011-12-13: qty 2

## 2011-12-13 NOTE — ED Notes (Signed)
Patient with c/o nausea and vomiting that started last night. Intermittent low grade fever. Patient c/o chronic bilateral knee pain with increased pain yesterday. Appears flushed.
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
## 2011-12-13 NOTE — ED Notes (Signed)
Pt c/o NVD since last night and chronic knee pain. Pt states he is scheduled for knee sx on October 2nd with Dr. Eulah Pont but the pain is becoming unbearable. Pt is taking pain meds at home but they are no longer effective. Pt states NVD began last night.
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
## 2011-12-14 ENCOUNTER — Inpatient Hospital Stay (HOSPITAL_COMMUNITY): Payer: Medicare Other

## 2011-12-14 ENCOUNTER — Encounter: Payer: Self-pay | Admitting: Family Medicine

## 2011-12-14 DIAGNOSIS — M1710 Unilateral primary osteoarthritis, unspecified knee: Secondary | ICD-10-CM

## 2011-12-14 DIAGNOSIS — IMO0002 Reserved for concepts with insufficient information to code with codable children: Secondary | ICD-10-CM

## 2011-12-14 DIAGNOSIS — I10 Essential (primary) hypertension: Secondary | ICD-10-CM

## 2011-12-14 LAB — COMPREHENSIVE METABOLIC PANEL
ALT: 13 U/L (ref 0–53)
AST: 14 U/L (ref 0–37)
Albumin: 3.3 g/dL — ABNORMAL LOW (ref 3.5–5.2)
Alkaline Phosphatase: 61 U/L (ref 39–117)
Calcium: 9 mg/dL (ref 8.4–10.5)
Potassium: 3.8 mEq/L (ref 3.5–5.1)
Sodium: 140 mEq/L (ref 135–145)
Total Protein: 6.8 g/dL (ref 6.0–8.3)

## 2011-12-14 LAB — HEMOGLOBIN A1C: Mean Plasma Glucose: 128 mg/dL — ABNORMAL HIGH (ref <117)

## 2011-12-14 LAB — LIPASE, BLOOD: Lipase: 187 U/L — ABNORMAL HIGH (ref 11–59)

## 2011-12-14 LAB — CBC
HCT: 40.3 % (ref 39.0–52.0)
Hemoglobin: 14 g/dL (ref 13.0–17.0)
MCH: 32.1 pg (ref 26.0–34.0)
MCHC: 34.7 g/dL (ref 30.0–36.0)
MCV: 92.4 fL (ref 78.0–100.0)

## 2011-12-14 LAB — TSH: TSH: 1.104 u[IU]/mL (ref 0.350–4.500)

## 2011-12-14 MED ORDER — SODIUM CHLORIDE 0.9 % IJ SOLN
INTRAMUSCULAR | Status: AC
  Administered 2011-12-14: 10 mL
  Filled 2011-12-14: qty 3

## 2011-12-14 NOTE — Progress Notes (Signed)
UR Chart Review Completed  

## 2011-12-14 NOTE — Progress Notes (Signed)
Chart reviewed.  Subjective: N/V resolved.  No abdominal pain. Knee pain much better controlled on IV dilaudid  Objective: Vital signs in last 24 hours: Filed Vitals:   12/13/11 1616 12/13/11 1710 12/13/11 2143 12/14/11 0515  BP: 133/83 136/80 130/80 107/68  Pulse: 93 98 97 75  Temp:   98.2 F (36.8 C) 98.2 F (36.8 C)  TempSrc:  Oral Oral Oral  Resp: 18 16 18 18   Height:  5\' 10"  (1.778 m)    Weight:  104.4 kg (230 lb 2.6 oz)    SpO2: 100% 97% 98% 93%   Weight change:   Intake/Output Summary (Last 24 hours) at 12/14/11 1112 Last data filed at 12/14/11 0800  Gross per 24 hour  Intake   2015 ml  Output      0 ml  Net   2015 ml   General: Alert, oriented, comfortable Lungs clear to auscultation bilaterally without wheeze rhonchi or rales Cardiovascular regular rate rhythm without murmurs gallops rubs Abdomen obese soft nontender nondistended Extremities no clubbing cyanosis or edema  Lab Results: Basic Metabolic Panel:  Lab 12/14/11 6962 12/13/11 1400  NA 140 139  K 3.8 2.7*  CL 102 95*  CO2 28 24  GLUCOSE 104* 176*  BUN 22 29*  CREATININE 1.01 1.38*  CALCIUM 9.0 10.2  MG -- 1.7  PHOS -- --   Liver Function Tests:  Lab 12/14/11 0525 12/13/11 1400  AST 14 15  ALT 13 19  ALKPHOS 61 82  BILITOT 0.9 0.8  PROT 6.8 8.7*  ALBUMIN 3.3* 4.5    Lab 12/14/11 0525 12/13/11 1400  LIPASE 187* 426*  AMYLASE -- --   No results found for this basename: AMMONIA:2 in the last 168 hours CBC:  Lab 12/14/11 0525 12/13/11 1400  WBC 14.0* 11.0*  NEUTROABS -- 9.2*  HGB 14.0 15.9  HCT 40.3 45.8  MCV 92.4 90.0  PLT 228 286   Cardiac Enzymes: No results found for this basename: CKTOTAL:3,CKMB:3,CKMBINDEX:3,TROPONINI:3 in the last 168 hours BNP: No results found for this basename: PROBNP:3 in the last 168 hours D-Dimer: No results found for this basename: DDIMER:2 in the last 168 hours CBG: No results found for this basename: GLUCAP:6 in the last 168  hours Hemoglobin A1C:  Lab 12/13/11 1910  HGBA1C 6.1*   Fasting Lipid Panel:  Lab 12/13/11 1911  CHOL --  HDL --  LDLCALC --  TRIG 87  CHOLHDL --  LDLDIRECT --   Thyroid Function Tests:  Lab 12/13/11 1910  TSH 1.104  T4TOTAL --  FREET4 --  T3FREE --  THYROIDAB --   Coagulation: No results found for this basename: LABPROT:4,INR:4 in the last 168 hours Anemia Panel: No results found for this basename: VITAMINB12,FOLATE,FERRITIN,TIBC,IRON,RETICCTPCT in the last 168 hours Urine Drug Screen: Drugs of Abuse  No results found for this basename: labopia, cocainscrnur, labbenz, amphetmu, thcu, labbarb    Alcohol Level: No results found for this basename: ETH:2 in the last 168 hours Urinalysis: No results found for this basename: COLORURINE:2,APPERANCEUR:2,LABSPEC:2,PHURINE:2,GLUCOSEU:2,HGBUR:2,BILIRUBINUR:2,KETONESUR:2,PROTEINUR:2,UROBILINOGEN:2,NITRITE:2,LEUKOCYTESUR:2 in the last 168 hours  Micro Results: No results found for this or any previous visit (from the past 240 hour(s)). Studies/Results: No results found.  Scheduled Meds:   . sodium chloride  1,000 mL Intravenous Once  . enoxaparin (LOVENOX) injection  40 mg Subcutaneous Q24H  . HYDROmorphone  1 mg Intravenous Once  . metoprolol  100 mg Oral Daily  . ondansetron  4 mg Intravenous Once  . pantoprazole  40 mg Intravenous Once  .  pantoprazole (PROTONIX) IV  40 mg Intravenous Q12H  . potassium chloride  10 mEq Intravenous Once  . potassium chloride  10 mEq Intravenous Q1 Hr x 4  . potassium chloride SA  40 mEq Oral Once  . sodium chloride      . DISCONTD: sodium chloride   Intravenous STAT  . DISCONTD: sodium chloride  1,000 mL Intravenous Once   Continuous Infusions:   . 0.9 % NaCl with KCl 40 mEq / L 150 mL/hr at 12/14/11 1023   PRN Meds:.clonazePAM, HYDROmorphone (DILAUDID) injection, ondansetron (ZOFRAN) IV, ondansetron, DISCONTD: ondansetron (ZOFRAN) IV Assessment/Plan: Principal Problem:  *Nausea  & vomiting Active Problems:  ANXIETY  DEPRESSION  HYPERTENSION  Osteoarthritis of both knees  Hypokalemia  Acute pancreatitis  CKD (chronic kidney disease), stage II  Hyperglycemia  Labs and symptoms improving. Decrease IV fluids. Try a solid diet. Followup on ultrasound results. Recheck labs in the morning.   LOS: 1 day   Aliyanna Wassmer L 12/14/2011, 11:12 AM

## 2011-12-14 NOTE — Care Management Note (Unsigned)
    Page 1 of 1   12/14/2011     4:09:02 PM   CARE MANAGEMENT NOTE 12/14/2011  Patient:  RIGGINS, CISEK   Account Number:  [id number]  Date Initiated:  12/14/2011  Documentation initiated by:  Rosemary Holms  Subjective/Objective Assessment:   Pt admitted from home with acute pancreatitis. States he lives with his wife who is a Engineer, civil (consulting). To have knee surgery on 10/2 and HH has been set up.     Action/Plan:   DC home for readmit for knee surgery   Anticipated DC Date:  12/15/2011   Anticipated DC Plan:  HOME/SELF CARE         Choice offered to / List presented to:             Status of service:  In process, will continue to follow Medicare Important Message given?   (If response is "NO", the following Medicare IM given date fields will be blank) Date Medicare IM given:   Date Additional Medicare IM given:    Discharge Disposition:    Per UR Regulation:    If discussed at Long Length of Stay Meetings, dates discussed:    Comments:  12/14/11 1600 Zaylen Susman Leanord Hawking RN BSN CM

## 2011-12-15 ENCOUNTER — Telehealth: Payer: Self-pay | Admitting: Family Medicine

## 2011-12-15 ENCOUNTER — Encounter (HOSPITAL_COMMUNITY): Payer: Self-pay | Admitting: Gastroenterology

## 2011-12-15 ENCOUNTER — Inpatient Hospital Stay (HOSPITAL_COMMUNITY): Payer: Medicare Other

## 2011-12-15 DIAGNOSIS — K859 Acute pancreatitis without necrosis or infection, unspecified: Secondary | ICD-10-CM

## 2011-12-15 LAB — CBC
HCT: 40.7 % (ref 39.0–52.0)
Hemoglobin: 13.9 g/dL (ref 13.0–17.0)
MCH: 31.8 pg (ref 26.0–34.0)
MCV: 93.1 fL (ref 78.0–100.0)
RBC: 4.37 MIL/uL (ref 4.22–5.81)

## 2011-12-15 LAB — BASIC METABOLIC PANEL
BUN: 19 mg/dL (ref 6–23)
CO2: 31 mEq/L (ref 19–32)
Calcium: 9.2 mg/dL (ref 8.4–10.5)
Glucose, Bld: 110 mg/dL — ABNORMAL HIGH (ref 70–99)
Sodium: 137 mEq/L (ref 135–145)

## 2011-12-15 MED ORDER — HYDROMORPHONE HCL 2 MG PO TABS: 2.0000 mg | ORAL_TABLET | ORAL | Status: DC | PRN

## 2011-12-15 MED ORDER — IOHEXOL 300 MG/ML  SOLN
100.0000 mL | Freq: Once | INTRAMUSCULAR | Status: AC | PRN
  Administered 2011-12-15: 100 mL via INTRAVENOUS

## 2011-12-15 MED ORDER — PROMETHAZINE HCL 12.5 MG PO TABS: 12.5000 mg | ORAL_TABLET | Freq: Four times a day (QID) | ORAL | Status: DC | PRN

## 2011-12-15 NOTE — Telephone Encounter (Signed)
Caller: Chase Caller; Phone: [phone]; Reason for Call: Wife calling to let Dr.  Milinda Cave know patient has been admitted to [facility] for pancreatitis.  If questions, please call her back.  Thanks
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
## 2011-12-15 NOTE — Consult Note (Signed)
REVIEWED.  CT NAIAP. D/C TO HOME. LOW FAT DIET FOR 2 WEEKS. EUS IN 4-6 WEEKS. OPV W/ DR. [name] IN 3-4 WEEKS.

## 2011-12-15 NOTE — Discharge Summary (Signed)
Physician Discharge Summary  Patient ID: Richard Levine MRN: [id number] DOB/AGE: Oct 04, 1950 61 y.o.  Admit date: 12/13/2011 Discharge date: 12/15/2011  Discharge Diagnoses:  Principal Problem:  *Acute pancreatitis Active Problems:  ANXIETY  DEPRESSION  HYPERTENSION  Osteoarthritis of both knees  Hypokalemia  CKD (chronic kidney disease), stage II  Hyperglycemia     Medication List     As of 12/15/2011  4:47 PM    STOP taking these medications         oxyCODONE 5 MG immediate release tablet   Commonly known as: Oxy IR/ROXICODONE      promethazine 25 MG suppository   Commonly known as: PHENERGAN      TAKE these medications         amLODipine 10 MG tablet   Commonly known as: NORVASC   Take 1 tablet (10 mg total) by mouth daily.      CENTRUM SILVER ADULT 50+ PO   Take 1 tablet by mouth daily.      clonazePAM 1 MG tablet   Commonly known as: KLONOPIN   Take 1 mg by mouth 2 (two) times daily as needed. For anxiety      hydrochlorothiazide 25 MG tablet   Commonly known as: HYDRODIURIL   Take 1 tablet (25 mg total) by mouth daily.      HYDROmorphone 2 MG tablet   Commonly known as: DILAUDID   Take 1-2 tablets (2-4 mg total) by mouth every 4 (four) hours as needed for pain.      lisinopril 5 MG tablet   Commonly known as: PRINIVIL,ZESTRIL   Take 1 tablet (5 mg total) by mouth daily.      metoprolol 200 MG 24 hr tablet   Commonly known as: TOPROL-XL   Take 0.5 tablets by mouth Daily.      morphine 30 MG 12 hr tablet   Commonly known as: MS CONTIN   Take 30 mg by mouth every 8 (eight) hours.      morphine 30 MG 24 hr capsule   Commonly known as: AVINZA   Take 30 mg by mouth 3 (three) times daily.      promethazine 12.5 MG tablet   Commonly known as: PHENERGAN   Take 1 tablet (12.5 mg total) by mouth every 6 (six) hours as needed for nausea.            Discharge Orders    Future Appointments: Provider: Department: Dept Phone: Center:   12/18/2011 9:15  AM Jeoffrey Massed, MD [facility] [phone] None   12/21/2011 10:00 AM [facility] [phone] None     Future Orders Please Complete By Expires   Discharge instructions      Comments:   Low fat diet   Activity as tolerated - No restrictions         Follow-up Information    Follow up with Eula Listen, MD. In 3 weeks.   Contact information:   [address] [place] [address] [phone]          Disposition: 01-Home or Self Care  Discharged Condition: stable  Consults: Treatment Team:  [place], MD  Labs:   Results for orders placed during the hospital encounter of 12/13/11 (from the past 48 hour(s))  TSH     Status: Normal   Collection Time   12/13/11  7:10 PM      Component Value Range Comment   TSH 1.104  0.350 - 4.500 uIU/mL   HEMOGLOBIN A1C     Status: Abnormal   Collection Time   12/13/11  7:10 PM      Component Value Range Comment   Hemoglobin A1C 6.1 (*) <5.7 %    Mean Plasma Glucose 128 (*) <117 mg/dL   TRIGLYCERIDES     Status: Normal   Collection Time   12/13/11  7:11 PM      Component Value Range Comment   Triglycerides 87  <150 mg/dL   COMPREHENSIVE METABOLIC PANEL     Status: Abnormal   Collection Time   12/14/11  5:25 AM      Component Value Range Comment   Sodium 140  135 - 145 mEq/L    Potassium 3.8  3.5 - 5.1 mEq/L DELTA CHECK NOTED   Chloride 102  96 - 112 mEq/L    CO2 28  19 - 32 mEq/L    Glucose, Bld 104 (*) 70 - 99 mg/dL    BUN 22  6 - 23 mg/dL    Creatinine, Ser 1.19  0.50 - 1.35 mg/dL    Calcium 9.0  8.4 - 14.7 mg/dL    Total Protein 6.8  6.0 - 8.3 g/dL    Albumin 3.3 (*) 3.5 - 5.2 g/dL    AST 14  0 - 37 U/L    ALT 13  0 - 53 U/L    Alkaline Phosphatase 61  39 - 117 U/L    Total Bilirubin 0.9  0.3 - 1.2 mg/dL    GFR calc non Af Amer 79 (*) >90 mL/min    GFR calc Af Amer >90  >90 mL/min   CBC     Status: Abnormal   Collection Time   12/14/11  5:25 AM       Component Value Range Comment   WBC 14.0 (*) 4.0 - 10.5 K/uL    RBC 4.36  4.22 - 5.81 MIL/uL    Hemoglobin 14.0  13.0 - 17.0 g/dL    HCT 82.9  56.2 - 13.0 %    MCV 92.4  78.0 - 100.0 fL    MCH 32.1  26.0 - 34.0 pg    MCHC 34.7  30.0 - 36.0 g/dL    RDW 86.5  78.4 - 69.6 %    Platelets 228  150 - 400 K/uL   LIPASE, BLOOD     Status: Abnormal   Collection Time   12/14/11  5:25 AM      Component Value Range Comment   Lipase 187 (*) 11 - 59 U/L   LIPASE, BLOOD     Status: Abnormal   Collection Time   12/15/11  5:28 AM      Component Value Range Comment   Lipase 178 (*) 11 - 59 U/L   CBC     Status: Normal   Collection Time   12/15/11  5:28 AM      Component Value Range Comment   WBC 9.4  4.0 - 10.5 K/uL    RBC 4.37  4.22 - 5.81 MIL/uL    Hemoglobin 13.9  13.0 - 17.0 g/dL    HCT 29.5  28.4 - 13.2 %    MCV 93.1  78.0 - 100.0 fL    MCH 31.8  26.0 - 34.0 pg    MCHC 34.2  30.0 - 36.0 g/dL    RDW 44.0  10.2 - 72.5 %    Platelets 208  150 - 400 K/uL   BASIC METABOLIC  PANEL     Status: Abnormal   Collection Time   12/15/11  5:28 AM      Component Value Range Comment   Sodium 137  135 - 145 mEq/L    Potassium 4.5  3.5 - 5.1 mEq/L    Chloride 99  96 - 112 mEq/L    CO2 31  19 - 32 mEq/L    Glucose, Bld 110 (*) 70 - 99 mg/dL    BUN 19  6 - 23 mg/dL    Creatinine, Ser 1.61  0.50 - 1.35 mg/dL    Calcium 9.2  8.4 - 09.6 mg/dL    GFR calc non Af Amer 80 (*) >90 mL/min    GFR calc Af Amer >90  >90 mL/min     Diagnostics:  US Abdomen Complete  12/14/2011  *RADIOLOGY REPORT*  Clinical Data:  Acute pancreatitis  COMPLETE ABDOMINAL ULTRASOUND  Comparison:  CT scan 10/28/2006  Findings:  Gallbladder:  No gallstones, gallbladder wall thickening, or pericholecystic fluid. No sonographic Murphy's sign  Common bile duct:  Measures 2.7 mm in diameter within normal limits.  Liver:  No focal lesion identified. Diffuse mild increased echogenicity of the liver suspicious for mild hepatic fatty  infiltration.  No intrahepatic biliary ductal dilatation.  IVC:  Appears normal.  Pancreas: Not visualized due to abundant bowel gas  Spleen:  Measures 5.4 cm in length.  Normal echogenicity.  Right Kidney:  Measures 12.3 cm in length.  No mass, hydronephrosis or diagnostic renal calculus  Left Kidney:  Measures 13.4 cm in length.  No mass, hydronephrosis or diagnostic renal calculus  Abdominal aorta:  No aneurysm identified. Measures up to 2.4 cm in diameter.  IMPRESSION: 1.  No gallstones are noted within gallbladder.  Normal CBD. 2.  Question mild hepatic fatty infiltration. 3.  The pancreas is not visualized due to abundant bowel gas. 4.  No hydronephrosis or diagnostic renal calculus.   Original Report Authenticated By: Natasha Mead, M.D.    Ct Abdomen Pelvis W Contrast  12/15/2011  *RADIOLOGY REPORT*  Clinical Data:   nausea, vomiting, abdominal pain, chronic renal insufficiency  CT ABDOMEN AND PELVIS WITH CONTRAST  Technique:  Multidetector CT imaging of the abdomen and pelvis was performed following the standard protocol during bolus administration of intravenous contrast.  Contrast: OMNIPAQUE IOHEXOL 300 MG/ML  SOLN  Comparison: 10/28/2006  Findings: Visualized lung bases clear.  Unremarkable liver, gallbladder, spleen, adrenal glands, pancreas.  11 mm fluid attenuation lesion in the lower pole left kidney.  Sub centimeter low attenuation lesion in the lower pole right kidney.  No hydronephrosis.  Patchy aortoiliac arterial plaque without aneurysm.  Fixation hardware in the lower lumbar spine.  Stomach is physiologically distended.  The small bowel is decompressed. Normal appendix.  There are scattered descending and sigmoid diverticula without adjacent inflammatory/edematous change. Urinary bladder physiologically distended.  Mild prostatic enlargement with central coarse calcifications.  No ascites.  No free air.  No pelvic, retroperitoneal, or mesenteric adenopathy. Portal vein is patent.   IMPRESSION:  1. No acute abnormality. 2.  Descending and sigmoid diverticulosis without CT evidence of diverticulitis or abscess. 3.  Small bilateral renal cysts. 4.  Mild aortoiliac arterial plaque.   Original Report Authenticated By: Osa Craver, M.D.     Procedures: none  Full Code   Hospital Course:  See H&P for complete admission details.  The patient is a 61 year old white maile who presents with crampy abdominal pain, nausea and vomiting.  He was found to have a lipase of 426 and a potassium of 2.7.  He denies heavy alcohol use.  He had a soft abdomen with minimal epigastric tenderness.    He was admitted to the hospitalist service.  Bowel rest, antiemetics and pain meds.  US showed no stones or ductal dilitation.  GI was consulted.  CT abdomen showed nothing concerning.  Patient's vomiting and pain had resolved by the time of discharge.  He was tolerating a regular diet.  He will have an outpatient endoscopic [place] to look for microlithiasis, mass.  Total time on the day of discharge greater than 30 minutes.  Discharge Exam:  Blood pressure 124/77, pulse 84, temperature 98.4 F (36.9 C), temperature source Oral, resp. rate 17, height 5\' 10"  (1.778 m), weight 104.4 kg (230 lb 2.6 oz), SpO2 96.00%.  Unchanged from 12/14/11  Signed: Crista Curb L 12/15/2011, 4:47 PM

## 2011-12-15 NOTE — Telephone Encounter (Signed)
FYI

## 2011-12-15 NOTE — Progress Notes (Signed)
Pt given discharge instructions and new prescriptions. Pt verbalized understanding of instructions and prescriptions. Pt was also given information about Type II Diabetes. Orson Ape D 12/15/2011

## 2011-12-15 NOTE — Progress Notes (Signed)
Subjective: Had some dyspepsia yesterday after eating a solid lunch. Resolved with sips of Coca-Cola. No pain today. No nausea today. Again, denies heavy alcohol use. Wife is requesting GI consult.  Objective: Vital signs in last 24 hours: Filed Vitals:   12/14/11 0515 12/14/11 1352 12/14/11 2115 12/15/11 0549  BP: 107/68 117/68 123/81 114/80  Pulse: 75 85 89 72  Temp: 98.2 F (36.8 C) 97.8 F (36.6 C) 99.2 F (37.3 C) 97.2 F (36.2 C)  TempSrc: Oral Oral    Resp: 18 18 16 16   Height:      Weight:      SpO2: 93% 97% 96% 97%   Weight change:   Intake/Output Summary (Last 24 hours) at 12/15/11 0908 Last data filed at 12/15/11 0435  Gross per 24 hour  Intake 1769.33 ml  Output      0 ml  Net 1769.33 ml   General: Alert, oriented, comfortable Lungs clear to auscultation bilaterally without wheeze rhonchi or rales Cardiovascular regular rate rhythm without murmurs gallops rubs Abdomen obese soft nontender nondistended Extremities no clubbing cyanosis or edema  Lab Results: Basic Metabolic Panel:  Lab 12/15/11 5409 12/14/11 0525 12/13/11 1400  NA 137 140 --  K 4.5 3.8 --  CL 99 102 --  CO2 31 28 --  GLUCOSE 110* 104* --  BUN 19 22 --  CREATININE 1.00 1.01 --  CALCIUM 9.2 9.0 --  MG -- -- 1.7  PHOS -- -- --   Liver Function Tests:  Lab 12/14/11 0525 12/13/11 1400  AST 14 15  ALT 13 19  ALKPHOS 61 82  BILITOT 0.9 0.8  PROT 6.8 8.7*  ALBUMIN 3.3* 4.5    Lab 12/15/11 0528 12/14/11 0525  LIPASE 178* 187*  AMYLASE -- --   No results found for this basename: AMMONIA:2 in the last 168 hours CBC:  Lab 12/15/11 0528 12/14/11 0525 12/13/11 1400  WBC 9.4 14.0* --  NEUTROABS -- -- 9.2*  HGB 13.9 14.0 --  HCT 40.7 40.3 --  MCV 93.1 92.4 --  PLT 208 228 --   Cardiac Enzymes: No results found for this basename: CKTOTAL:3,CKMB:3,CKMBINDEX:3,TROPONINI:3 in the last 168 hours BNP: No results found for this basename: PROBNP:3 in the last 168  hours D-Dimer: No results found for this basename: DDIMER:2 in the last 168 hours CBG: No results found for this basename: GLUCAP:6 in the last 168 hours Hemoglobin A1C:  Lab 12/13/11 1910  HGBA1C 6.1*   Fasting Lipid Panel:  Lab 12/13/11 1911  CHOL --  HDL --  LDLCALC --  TRIG 87  CHOLHDL --  LDLDIRECT --   Thyroid Function Tests:  Lab 12/13/11 1910  TSH 1.104  T4TOTAL --  FREET4 --  T3FREE --  THYROIDAB --   Coagulation: No results found for this basename: LABPROT:4,INR:4 in the last 168 hours Anemia Panel: No results found for this basename: VITAMINB12,FOLATE,FERRITIN,TIBC,IRON,RETICCTPCT in the last 168 hours Urine Drug Screen: Drugs of Abuse  No results found for this basename: labopia,  cocainscrnur,  labbenz,  amphetmu,  thcu,  labbarb    Alcohol Level: No results found for this basename: ETH:2 in the last 168 hours Urinalysis: No results found for this basename: COLORURINE:2,APPERANCEUR:2,LABSPEC:2,PHURINE:2,GLUCOSEU:2,HGBUR:2,BILIRUBINUR:2,KETONESUR:2,PROTEINUR:2,UROBILINOGEN:2,NITRITE:2,LEUKOCYTESUR:2 in the last 168 hours  Micro Results: No results found for this or any previous visit (from the past 240 hour(s)). Studies/Results: US Abdomen Complete  12/14/2011  *RADIOLOGY REPORT*  Clinical Data:  Acute pancreatitis  COMPLETE ABDOMINAL ULTRASOUND  Comparison:  CT scan 10/28/2006  Findings:  Gallbladder:  No gallstones, gallbladder wall thickening, or pericholecystic fluid. No sonographic Murphy's sign  Common bile duct:  Measures 2.7 mm in diameter within normal limits.  Liver:  No focal lesion identified. Diffuse mild increased echogenicity of the liver suspicious for mild hepatic fatty infiltration.  No intrahepatic biliary ductal dilatation.  IVC:  Appears normal.  Pancreas: Not visualized due to abundant bowel gas  Spleen:  Measures 5.4 cm in length.  Normal echogenicity.  Right Kidney:  Measures 12.3 cm in length.  No mass, hydronephrosis or diagnostic  renal calculus  Left Kidney:  Measures 13.4 cm in length.  No mass, hydronephrosis or diagnostic renal calculus  Abdominal aorta:  No aneurysm identified. Measures up to 2.4 cm in diameter.  IMPRESSION: 1.  No gallstones are noted within gallbladder.  Normal CBD. 2.  Question mild hepatic fatty infiltration. 3.  The pancreas is not visualized due to abundant bowel gas. 4.  No hydronephrosis or diagnostic renal calculus.   Original Report Authenticated By: Natasha Mead, M.D.     Scheduled Meds:    . enoxaparin (LOVENOX) injection  40 mg Subcutaneous Q24H  . metoprolol  100 mg Oral Daily  . sodium chloride      . DISCONTD: pantoprazole (PROTONIX) IV  40 mg Intravenous Q12H   Continuous Infusions:    . 0.9 % NaCl with KCl 40 mEq / L 20 mL/hr (12/14/11 1152)   PRN Meds:.clonazePAM, HYDROmorphone (DILAUDID) injection, ondansetron (ZOFRAN) IV, ondansetron Assessment/Plan: Principal Problem:  *Nausea & vomiting Active Problems:  ANXIETY  DEPRESSION  HYPERTENSION  Osteoarthritis of both knees  Hypokalemia  Acute pancreatitis  CKD (chronic kidney disease), stage II  Hyperglycemia  Patient symptoms have essentially resolved. Lipase still slightly elevated, but trend has improved. It is reasonable to do an inpatient GI evaluation, as etiology is not entirely clear. Could be medication related, but need to rule out other causes. Will likely need outpatient endoscopic ultrasound. Have discussed with GI who will consult. If no further workup as an inpatient, can likely go home today.   LOS: 2 days   Charniece Venturino L 12/15/2011, 9:08 AM

## 2011-12-18 ENCOUNTER — Encounter (HOSPITAL_COMMUNITY): Payer: Self-pay | Admitting: Pharmacy Technician

## 2011-12-18 ENCOUNTER — Encounter: Payer: Self-pay | Admitting: Family Medicine

## 2011-12-18 ENCOUNTER — Ambulatory Visit (INDEPENDENT_AMBULATORY_CARE_PROVIDER_SITE_OTHER): Payer: Medicare Other | Admitting: Family Medicine

## 2011-12-18 VITALS — BP 130/88 | HR 99 | Ht 71.0 in | Wt 242.0 lb

## 2011-12-18 DIAGNOSIS — M545 Low back pain, unspecified: Secondary | ICD-10-CM

## 2011-12-18 DIAGNOSIS — G8929 Other chronic pain: Secondary | ICD-10-CM

## 2011-12-18 DIAGNOSIS — K8500 Idiopathic acute pancreatitis without necrosis or infection: Secondary | ICD-10-CM

## 2011-12-18 DIAGNOSIS — R7309 Other abnormal glucose: Secondary | ICD-10-CM

## 2011-12-18 DIAGNOSIS — K859 Acute pancreatitis without necrosis or infection, unspecified: Secondary | ICD-10-CM

## 2011-12-18 DIAGNOSIS — R739 Hyperglycemia, unspecified: Secondary | ICD-10-CM

## 2011-12-18 MED ORDER — MORPHINE SULFATE ER BEADS 30 MG PO CP24: 30.0000 mg | ORAL_CAPSULE | Freq: Three times a day (TID) | ORAL | Status: DC

## 2011-12-18 MED ORDER — OXYCODONE HCL 5 MG PO TABS: ORAL_TABLET | ORAL | Status: DC

## 2011-12-18 MED ORDER — MORPHINE SULFATE ER BEADS 30 MG PO CP24
30.0000 mg | ORAL_CAPSULE | Freq: Three times a day (TID) | ORAL | Status: DC
Start: 2011-12-18 — End: 2011-12-19
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
## 2011-12-19 ENCOUNTER — Telehealth: Payer: Self-pay | Admitting: *Deleted

## 2011-12-19 ENCOUNTER — Telehealth: Payer: Self-pay

## 2011-12-19 MED ORDER — MORPHINE SULFATE ER 30 MG PO TBCR: 30.0000 mg | EXTENDED_RELEASE_TABLET | Freq: Three times a day (TID) | ORAL | Status: DC

## 2011-12-19 NOTE — Telephone Encounter (Signed)
Pt called today to say that [facility] did take back Avinza/MS Contin.  Confirmed with Christy at [facility] that Avinza was returned.  Advised to remove from profile. Verified with Dr. Milinda Cave via phone that he has taken incorrect med off list.  Correct med is MS CONTIN 30 mg 12 hour tablet, 1 q8hour, #90 x 0.   RX printed for Dr. Abner Greenspan to sign in Dr. Milinda Cave absence.

## 2011-12-19 NOTE — Assessment & Plan Note (Signed)
Idiopathic.   Lipase was trending down prior to hospital d/c and he is no longer symptomatic.

## 2011-12-19 NOTE — Assessment & Plan Note (Addendum)
He reports a glucose of around 400 on admission to hospital for acute pancreatitis. I don't see this in the records.  The glucoses I do see in the record are all <140, most of them are near 100. His HbA1c was 6.1 % in hosp, so he is at risk for DM.  I gave him a glucometer today and 10 sample strips: nurse taught him how to use the glucometer and he was instructed to check 5 fasting and 5 two hour PP glucoses.

## 2011-12-19 NOTE — Assessment & Plan Note (Addendum)
Also with debilitating pain secondary to severe DJD knees.  He is set for TKR soon. Pain meds were changed in hospital, but I don't want him treating his pain with just prn dilaudid. Therefore, will get him back on the MS contin 30mg  tid and 5mg  oxycodone for breakthrough pain. Of note, the first rx's for morphine and oxycodone that I did today never printed due to printer malfunction. After printer was fixed, I did new rx's and these did print.  I say this to document that he did NOT get TWO rx's for each of his pain meds today.  Pain contract is in the chart.

## 2011-12-19 NOTE — Telephone Encounter (Signed)
Zonia Kief PA for Dr. Eulah Pont called- this pt is scheduled for a total knee replacement next week. They are aware that he was recently hospitalized for pancreatitis. Pt had ov with PCP(Dr. McGowen) yesterday and was cleared to have surgery. Dr. Eulah Pont is requesting clearance from a GI standpoint. They will need a letter written and faxed to [phone] or if you would rather speak with Dr. Eulah Pont or Zonia Kief PA they will be available after 2pm today at [phone]  Notes from hospital on RMR desk.  Please advise.

## 2011-12-19 NOTE — Telephone Encounter (Signed)
Pt notified RX ready.  Wife will pick up.

## 2011-12-20 NOTE — Telephone Encounter (Signed)
Letter from [facility] faxed to [phone]. Darl Pikes please make sure pt has ov with RMR in 3-4 weeks.

## 2011-12-20 NOTE — Telephone Encounter (Signed)
I do not see where he is a current patient of mine

## 2011-12-20 NOTE — Telephone Encounter (Signed)
Routing to Dr. Fields.  

## 2011-12-20 NOTE — Telephone Encounter (Signed)
FAX LETTER TO MR. OWEN AND DR. Eulah Pont. PT'S EPISODE OF PANCREATITIS WILL NOT INTERFERE WITH HIS ABILITY TO TOLERATE/HAVE A KNEE REPLACEMENT. HE SHOULD FOLLOW A LOW FAT DIET UNTIL SEEN BY DR. [name].

## 2011-12-20 NOTE — Telephone Encounter (Signed)
LSL saw pt while he was in the hospital. He requested to be a RMR pt.

## 2011-12-21 ENCOUNTER — Encounter (HOSPITAL_COMMUNITY): Admission: RE | Admit: 2011-12-21 | Payer: Medicare Other | Source: Ambulatory Visit

## 2011-12-22 ENCOUNTER — Encounter (HOSPITAL_COMMUNITY): Payer: Self-pay

## 2011-12-22 ENCOUNTER — Encounter (HOSPITAL_COMMUNITY)
Admission: RE | Admit: 2011-12-22 | Discharge: 2011-12-22 | Disposition: A | Discharge status: Home / Self Care | Payer: Medicare Other | Source: Ambulatory Visit | Attending: Surgery | Admitting: Surgery

## 2011-12-22 ENCOUNTER — Encounter (HOSPITAL_COMMUNITY)
Admission: RE | Admit: 2011-12-22 | Discharge: 2011-12-22 | Disposition: A | Discharge status: Home / Self Care | Payer: Medicare Other | Source: Ambulatory Visit | Attending: Orthopedic Surgery | Admitting: Orthopedic Surgery

## 2011-12-22 ENCOUNTER — Encounter: Payer: Self-pay | Admitting: Internal Medicine

## 2011-12-22 HISTORY — DX: Urinary calculus, unspecified: N20.9

## 2011-12-22 HISTORY — DX: Gastro-esophageal reflux disease without esophagitis: K21.9

## 2011-12-22 LAB — COMPREHENSIVE METABOLIC PANEL
ALT: 18 U/L (ref 0–53)
AST: 19 U/L (ref 0–37)
Albumin: 3.8 g/dL (ref 3.5–5.2)
Alkaline Phosphatase: 67 U/L (ref 39–117)
Chloride: 92 mEq/L — ABNORMAL LOW (ref 96–112)
Potassium: 3.8 mEq/L (ref 3.5–5.1)
Sodium: 134 mEq/L — ABNORMAL LOW (ref 135–145)
Total Bilirubin: 0.5 mg/dL (ref 0.3–1.2)
Total Protein: 7.6 g/dL (ref 6.0–8.3)

## 2011-12-22 LAB — TYPE AND SCREEN: Antibody Screen: NEGATIVE

## 2011-12-22 LAB — CBC
HCT: 40.5 % (ref 39.0–52.0)
Platelets: 259 10*3/uL (ref 150–400)
RDW: 12.3 % (ref 11.5–15.5)
WBC: 8.6 10*3/uL (ref 4.0–10.5)

## 2011-12-22 LAB — URINALYSIS, ROUTINE W REFLEX MICROSCOPIC
Bilirubin Urine: NEGATIVE
Glucose, UA: NEGATIVE mg/dL
Hgb urine dipstick: NEGATIVE
Ketones, ur: NEGATIVE mg/dL
Protein, ur: NEGATIVE mg/dL

## 2011-12-22 LAB — APTT: aPTT: 27 seconds (ref 24–37)

## 2011-12-22 LAB — SURGICAL PCR SCREEN: MRSA, PCR: NEGATIVE

## 2011-12-22 NOTE — Telephone Encounter (Signed)
Pt is aware of OV on 10/18 @ 1030 with LSL and appt card was mailed

## 2011-12-22 NOTE — Pre-Procedure Instructions (Addendum)
20 Richard Levine  12/22/2011   Your procedure is scheduled on:  12/27/11  Report to [facility] at 630 AM.  Call this number if you have problems the morning of surgery: [phone]   Remember:   Do not eat food or drink:After Midnight.    Take these medicines the morning of surgery with A SIP OF WATER: amlodipine, clonazepam, metoprolol,,pain med, protonix, promethazine  STOP multi vit, nasaids, aspirin, now   Do not wear jewelry,   Do not wear lotions, powders, or perfumes.   Do not shave 48 hours prior to surgery. Men may shave face and neck.  Do not bring valuables to the hospital.  Contacts, dentures or bridgework may not be worn into surgery.  Leave suitcase in the car. After surgery it may be brought to your room.  For patients admitted to the hospital, checkout time is 11:00 AM the day of discharge.   Patients discharged the day of surgery will not be allowed to drive home.  Name and phone number of your driver: suzanne wife [phone]  Special Instructions: Incentive Spirometry - Practice and bring it with you on the day of surgery. Shower using CHG 2 nights before surgery and the night before surgery.  If you shower the day of surgery use CHG.  Use special wash - you have one bottle of CHG for all showers.  You should use approximately 1/3 of the bottle for each shower.   Please read over the following fact sheets that you were given: Pain Booklet, Coughing and Deep Breathing, Blood Transfusion Information, Total Joint Packet, MRSA Information and Surgical Site Infection Prevention

## 2011-12-22 NOTE — Telephone Encounter (Signed)
Noted-PM 

## 2011-12-26 MED ORDER — CEFAZOLIN SODIUM-DEXTROSE 2-3 GM-% IV SOLR
2.0000 g | INTRAVENOUS | Status: AC
  Administered 2011-12-27: 2 g via INTRAVENOUS
  Filled 2011-12-26: qty 50

## 2011-12-26 NOTE — H&P (Signed)
  [facility] [address]   [address] [place], [place] [address] [phone] A Division of [facility]  Loreta Ave, M.D.   Robert A. Thurston Hole, M.D.   Burnell Blanks, M.D.   Eulas Post, M.D.   Lunette Stands, M.D Buford Dresser, M.D.  Charlsie Quest, M.D.   Estell Harpin, M.D.   Melina Fiddler, M.D. Genene Churn. Barry Dienes, PA-C            Kirstin A. Shepperson, PA-C Josh Muske[place], PA-C [place], [place]   RE: [place], [place]                                [phone]      DOB: October 18, 1950 PROGRESS NOTE: 12-19-11 Chief complaint: Left knee pain.  History of present illness: 61 year-old white male with a history of end stage DJD, left knee, and chronic pain.  Returns.  States that knee symptoms are unchanged from previous visit.  He is wanting to proceed with total knee replacement as scheduled.  Patient recently had an issue with acute pancreatitis.  Symptoms currently resolved and gastroenterologist, Dr. Kendell Bane in [place], recently did another evaluation and patient states that he was told that he could proceed with surgery.  We have not received official clearance yet.  No complaints of abdominal pain, nausea or vomiting.  We received pre-op medical clearance.   Current medications: Klonopin, Lisinopril, Toprol, Zofran, Phenergan, Carafate, Norvasc, Cymbalta, HCTZ, MS Contin, Oxycodone, Celebrex and Protonix. Allergies: Codeine (GI upset).   Past medical/surgical history: Hypertension, hyperlipidemia, anxiety/depression, lumbar degenerative disc disease with a history of back surgery, seizure disorder, migraines, history of NSAID induced gastritis, chronic renal insufficiency, chronic back pain, right foot drop, lumbar laminectomy, colonoscopy and pancreatitis. Review of systems: Currently denies lightheadedness, dizziness, fevers or chills, chest pain, shortness of breath, GI or GU issues.  Family history:  Healthy. Social history: Does not smoke or drink.  Patient is married and currently retired.     EXAMINATION: Height: 5?10.  Weight: 242 pounds.  Blood pressure: 115/77.  Pulse: 77. Respirations: 30.  Temperature: 98.2.  Pleasant white male, alert and oriented x 3 and in no acute distress.  Head is normocephalic, a traumatic.  PERRLA, EOMI.  Cervical spine unremarkable.  Lungs: CTA bilaterally.  No wheezes.  Heart: RRR.  S1 and S2.  No murmurs.  Abdomen: Round and non-distended.  NBS x 4.  Soft and non-tender.  Gait is antalgic.  Left knee: Decreased range of motion.  Positive crepitus.  Positive effusion.  Joint line tender.  Ligaments stable.  Calf non-tender.  Neurovascularly intact.  Skin warm and dry.        IMPRESSION: End stage DJD, left knee, and chronic pain.  Failed conservative treatment.   PLAN: We will proceed with left total knee replacement as scheduled.  Surgical procedure, along with potential rehab/recovery time discussed.  All questions answered.  We will need to get final clearance from Dr. Kendell Bane, gastroenterologist in [place].  Patient also has a history of end stage DJD, right knee, and was put on the schedule to have right total knee replacement around six weeks post-op, depending upon how he rehabs his left knee.  All questions answered.    Loreta Ave, M.D.   Electronically verified by Loreta Ave, M.D. DFM(JMO):jjh D 12-20-11 T 12-22-11

## 2011-12-27 ENCOUNTER — Inpatient Hospital Stay (HOSPITAL_COMMUNITY): Payer: Medicare Other | Admitting: Anesthesiology

## 2011-12-27 ENCOUNTER — Encounter (HOSPITAL_COMMUNITY): Payer: Self-pay | Admitting: Anesthesiology

## 2011-12-27 ENCOUNTER — Encounter (HOSPITAL_COMMUNITY)
Admission: RE | Disposition: A | Discharge status: Home Health | Payer: Self-pay | Source: Ambulatory Visit | Attending: Orthopedic Surgery

## 2011-12-27 ENCOUNTER — Telehealth: Payer: Self-pay | Admitting: Family Medicine

## 2011-12-27 ENCOUNTER — Encounter (HOSPITAL_COMMUNITY): Payer: Self-pay | Admitting: *Deleted

## 2011-12-27 ENCOUNTER — Inpatient Hospital Stay (HOSPITAL_COMMUNITY)
Admission: RE | Admit: 2011-12-27 | Discharge: 2011-12-29 | DRG: 470 | Disposition: A | Discharge status: Home Health | Payer: Medicare Other | Source: Ambulatory Visit | Attending: Orthopedic Surgery | Admitting: Orthopedic Surgery

## 2011-12-27 DIAGNOSIS — I129 Hypertensive chronic kidney disease with stage 1 through stage 4 chronic kidney disease, or unspecified chronic kidney disease: Secondary | ICD-10-CM | POA: Diagnosis present

## 2011-12-27 DIAGNOSIS — K219 Gastro-esophageal reflux disease without esophagitis: Secondary | ICD-10-CM | POA: Diagnosis present

## 2011-12-27 DIAGNOSIS — Z01818 Encounter for other preprocedural examination: Secondary | ICD-10-CM

## 2011-12-27 DIAGNOSIS — F329 Major depressive disorder, single episode, unspecified: Secondary | ICD-10-CM | POA: Diagnosis present

## 2011-12-27 DIAGNOSIS — F411 Generalized anxiety disorder: Secondary | ICD-10-CM | POA: Diagnosis present

## 2011-12-27 DIAGNOSIS — Z01811 Encounter for preprocedural respiratory examination: Secondary | ICD-10-CM

## 2011-12-27 DIAGNOSIS — M216X9 Other acquired deformities of unspecified foot: Secondary | ICD-10-CM | POA: Diagnosis present

## 2011-12-27 DIAGNOSIS — Z01812 Encounter for preprocedural laboratory examination: Secondary | ICD-10-CM

## 2011-12-27 DIAGNOSIS — N189 Chronic kidney disease, unspecified: Secondary | ICD-10-CM | POA: Diagnosis present

## 2011-12-27 DIAGNOSIS — Z96659 Presence of unspecified artificial knee joint: Secondary | ICD-10-CM

## 2011-12-27 DIAGNOSIS — E785 Hyperlipidemia, unspecified: Secondary | ICD-10-CM | POA: Diagnosis present

## 2011-12-27 DIAGNOSIS — F3289 Other specified depressive episodes: Secondary | ICD-10-CM | POA: Diagnosis present

## 2011-12-27 DIAGNOSIS — G40909 Epilepsy, unspecified, not intractable, without status epilepticus: Secondary | ICD-10-CM | POA: Diagnosis present

## 2011-12-27 DIAGNOSIS — M1710 Unilateral primary osteoarthritis, unspecified knee: Principal | ICD-10-CM | POA: Diagnosis present

## 2011-12-27 DIAGNOSIS — Z79899 Other long term (current) drug therapy: Secondary | ICD-10-CM

## 2011-12-27 HISTORY — PX: TOTAL KNEE ARTHROPLASTY: SHX125

## 2011-12-27 SURGERY — ARTHROPLASTY, KNEE, TOTAL
Anesthesia: General | Site: Knee | Laterality: Left | Wound class: Clean

## 2011-12-27 MED ORDER — POTASSIUM CHLORIDE IN NACL 20-0.9 MEQ/L-% IV SOLN
INTRAVENOUS | Status: DC
  Filled 2011-12-27 (×6): qty 1000

## 2011-12-27 MED ORDER — METHOCARBAMOL 100 MG/ML IJ SOLN
500.0000 mg | Freq: Four times a day (QID) | INTRAVENOUS | Status: DC | PRN
  Administered 2011-12-27: 500 mg via INTRAVENOUS
  Filled 2011-12-27: qty 5

## 2011-12-27 MED ORDER — DIPHENHYDRAMINE HCL 50 MG/ML IJ SOLN: 12.5000 mg | Freq: Four times a day (QID) | INTRAMUSCULAR | Status: DC | PRN

## 2011-12-27 MED ORDER — WARFARIN - PHARMACIST DOSING INPATIENT
Freq: Every day | Status: DC
  Administered 2011-12-27: 18:00:00

## 2011-12-27 MED ORDER — MORPHINE SULFATE 4 MG/ML IJ SOLN
INTRAMUSCULAR | Status: DC | PRN
  Administered 2011-12-27: 4 mg

## 2011-12-27 MED ORDER — NALOXONE HCL 0.4 MG/ML IJ SOLN
0.4000 mg | INTRAMUSCULAR | Status: DC | PRN
Start: 2011-12-27 — End: 2011-12-27

## 2011-12-27 MED ORDER — ENOXAPARIN SODIUM 30 MG/0.3ML ~~LOC~~ SOLN
30.0000 mg | Freq: Two times a day (BID) | SUBCUTANEOUS | Status: DC
  Administered 2011-12-28 – 2011-12-29 (×3): 30 mg via SUBCUTANEOUS
  Filled 2011-12-27 (×5): qty 0.3

## 2011-12-27 MED ORDER — ACETAMINOPHEN 650 MG RE SUPP: 650.0000 mg | Freq: Four times a day (QID) | RECTAL | Status: DC | PRN

## 2011-12-27 MED ORDER — PHENOL 1.4 % MT LIQD: 1.0000 | OROMUCOSAL | Status: DC | PRN

## 2011-12-27 MED ORDER — WARFARIN VIDEO: 1.0000 | Freq: Once | Status: DC

## 2011-12-27 MED ORDER — LIDOCAINE HCL (CARDIAC) 20 MG/ML IV SOLN
INTRAVENOUS | Status: DC | PRN
  Administered 2011-12-27: 50 mg via INTRAVENOUS

## 2011-12-27 MED ORDER — ONDANSETRON HCL 4 MG/2ML IJ SOLN: 4.0000 mg | Freq: Four times a day (QID) | INTRAMUSCULAR | Status: DC | PRN

## 2011-12-27 MED ORDER — NEOSTIGMINE METHYLSULFATE 1 MG/ML IJ SOLN
INTRAMUSCULAR | Status: DC | PRN
  Administered 2011-12-27: 5 mg via INTRAVENOUS

## 2011-12-27 MED ORDER — ACETAMINOPHEN 325 MG PO TABS: 650.0000 mg | ORAL_TABLET | Freq: Four times a day (QID) | ORAL | Status: DC | PRN

## 2011-12-27 MED ORDER — MAGNESIUM HYDROXIDE 400 MG/5ML PO SUSP
30.0000 mL | Freq: Every day | ORAL | Status: DC | PRN
  Administered 2011-12-29: 30 mL via ORAL
  Filled 2011-12-27: qty 30

## 2011-12-27 MED ORDER — CEFAZOLIN SODIUM 1-5 GM-% IV SOLN
1.0000 g | Freq: Three times a day (TID) | INTRAVENOUS | Status: AC
  Administered 2011-12-27: 1 g via INTRAVENOUS
  Filled 2011-12-27 (×2): qty 50

## 2011-12-27 MED ORDER — METHOCARBAMOL 500 MG PO TABS
500.0000 mg | ORAL_TABLET | Freq: Four times a day (QID) | ORAL | Status: DC | PRN
  Administered 2011-12-27 – 2011-12-29 (×6): 500 mg via ORAL
  Filled 2011-12-27 (×7): qty 1

## 2011-12-27 MED ORDER — ONDANSETRON HCL 4 MG PO TABS: 4.0000 mg | ORAL_TABLET | Freq: Four times a day (QID) | ORAL | Status: DC | PRN

## 2011-12-27 MED ORDER — DIPHENHYDRAMINE HCL 12.5 MG/5ML PO ELIX: 12.5000 mg | ORAL_SOLUTION | Freq: Four times a day (QID) | ORAL | Status: DC | PRN

## 2011-12-27 MED ORDER — METOCLOPRAMIDE HCL 5 MG/ML IJ SOLN: 5.0000 mg | Freq: Three times a day (TID) | INTRAMUSCULAR | Status: DC | PRN

## 2011-12-27 MED ORDER — MORPHINE SULFATE 4 MG/ML IJ SOLN
INTRAMUSCULAR | Status: AC
  Filled 2011-12-27: qty 1

## 2011-12-27 MED ORDER — NALOXONE HCL 0.4 MG/ML IJ SOLN: 0.4000 mg | INTRAMUSCULAR | Status: DC | PRN

## 2011-12-27 MED ORDER — LACTATED RINGERS IV SOLN
INTRAVENOUS | Status: DC | PRN
  Administered 2011-12-27 (×2): via INTRAVENOUS

## 2011-12-27 MED ORDER — ROCURONIUM BROMIDE 100 MG/10ML IV SOLN
INTRAVENOUS | Status: DC | PRN
  Administered 2011-12-27: 50 mg via INTRAVENOUS

## 2011-12-27 MED ORDER — COUMADIN BOOK
1.0000 | Freq: Once | Status: AC
  Administered 2011-12-27: 1
  Filled 2011-12-27: qty 1

## 2011-12-27 MED ORDER — HYDROMORPHONE 0.3 MG/ML IV SOLN
INTRAVENOUS | Status: DC
  Administered 2011-12-27: 0.6 mg via INTRAVENOUS
  Administered 2011-12-27: 16:00:00 via INTRAVENOUS
  Administered 2011-12-28: 1.2 mg via INTRAVENOUS
  Administered 2011-12-28: 0.9 mg via INTRAVENOUS
  Filled 2011-12-27: qty 25

## 2011-12-27 MED ORDER — HYDROMORPHONE HCL PF 1 MG/ML IJ SOLN: 0.2500 mg | INTRAMUSCULAR | Status: DC | PRN

## 2011-12-27 MED ORDER — BUPIVACAINE HCL (PF) 0.25 % IJ SOLN
INTRAMUSCULAR | Status: DC | PRN
  Administered 2011-12-27: 30 mL

## 2011-12-27 MED ORDER — SODIUM CHLORIDE 0.9 % IJ SOLN: 9.0000 mL | INTRAMUSCULAR | Status: DC | PRN

## 2011-12-27 MED ORDER — FENTANYL CITRATE 0.05 MG/ML IJ SOLN
INTRAMUSCULAR | Status: DC | PRN
  Administered 2011-12-27: 50 ug via INTRAVENOUS
  Administered 2011-12-27 (×2): 100 ug via INTRAVENOUS
  Administered 2011-12-27 (×2): 50 ug via INTRAVENOUS

## 2011-12-27 MED ORDER — MIDAZOLAM HCL 5 MG/5ML IJ SOLN
INTRAMUSCULAR | Status: DC | PRN
  Administered 2011-12-27 (×2): 1 mg via INTRAVENOUS

## 2011-12-27 MED ORDER — HYDROCHLOROTHIAZIDE 25 MG PO TABS
25.0000 mg | ORAL_TABLET | Freq: Every day | ORAL | Status: DC
  Administered 2011-12-28 – 2011-12-29 (×2): 25 mg via ORAL
  Filled 2011-12-27 (×2): qty 1

## 2011-12-27 MED ORDER — GLYCOPYRROLATE 0.2 MG/ML IJ SOLN
INTRAMUSCULAR | Status: DC | PRN
  Administered 2011-12-27: .8 mg via INTRAVENOUS

## 2011-12-27 MED ORDER — DOCUSATE SODIUM 100 MG PO CAPS
100.0000 mg | ORAL_CAPSULE | Freq: Two times a day (BID) | ORAL | Status: DC
  Administered 2011-12-27 – 2011-12-29 (×5): 100 mg via ORAL
  Filled 2011-12-27 (×6): qty 1

## 2011-12-27 MED ORDER — ACETAMINOPHEN 10 MG/ML IV SOLN
INTRAVENOUS | Status: AC
  Filled 2011-12-27: qty 100

## 2011-12-27 MED ORDER — PANTOPRAZOLE SODIUM 40 MG PO TBEC
40.0000 mg | DELAYED_RELEASE_TABLET | Freq: Two times a day (BID) | ORAL | Status: DC
  Administered 2011-12-27 – 2011-12-29 (×4): 40 mg via ORAL
  Filled 2011-12-27 (×4): qty 1

## 2011-12-27 MED ORDER — MORPHINE SULFATE (PF) 1 MG/ML IV SOLN
INTRAVENOUS | Status: DC
  Administered 2011-12-27: 12:00:00 via INTRAVENOUS

## 2011-12-27 MED ORDER — BUPIVACAINE HCL (PF) 0.25 % IJ SOLN
INTRAMUSCULAR | Status: AC
  Filled 2011-12-27: qty 30

## 2011-12-27 MED ORDER — DEXTROSE 5 % IV SOLN
INTRAVENOUS | Status: DC | PRN
  Administered 2011-12-27: 09:00:00 via INTRAVENOUS

## 2011-12-27 MED ORDER — METOCLOPRAMIDE HCL 10 MG PO TABS
5.0000 mg | ORAL_TABLET | Freq: Three times a day (TID) | ORAL | Status: DC | PRN
  Administered 2011-12-29: 10 mg via ORAL
  Filled 2011-12-27: qty 1

## 2011-12-27 MED ORDER — WARFARIN SODIUM 7.5 MG PO TABS
7.5000 mg | ORAL_TABLET | Freq: Once | ORAL | Status: AC
  Administered 2011-12-27: 7.5 mg via ORAL
  Filled 2011-12-27: qty 1

## 2011-12-27 MED ORDER — MORPHINE SULFATE (PF) 1 MG/ML IV SOLN
INTRAVENOUS | Status: AC
  Filled 2011-12-27: qty 25

## 2011-12-27 MED ORDER — MENTHOL 3 MG MT LOZG: 1.0000 | LOZENGE | OROMUCOSAL | Status: DC | PRN

## 2011-12-27 MED ORDER — PROPOFOL 10 MG/ML IV BOLUS
INTRAVENOUS | Status: DC | PRN
  Administered 2011-12-27: 20 mg via INTRAVENOUS
  Administered 2011-12-27: 150 mg via INTRAVENOUS
  Administered 2011-12-27: 30 mg via INTRAVENOUS

## 2011-12-27 MED ORDER — ONDANSETRON HCL 4 MG/2ML IJ SOLN
INTRAMUSCULAR | Status: DC | PRN
  Administered 2011-12-27: 4 mg via INTRAVENOUS

## 2011-12-27 MED ORDER — SODIUM CHLORIDE 0.9 % IR SOLN
Status: DC | PRN
  Administered 2011-12-27: 1000 mL
  Administered 2011-12-27: 3000 mL

## 2011-12-27 MED ORDER — BUPIVACAINE-EPINEPHRINE PF 0.5-1:200000 % IJ SOLN
INTRAMUSCULAR | Status: DC | PRN
  Administered 2011-12-27: 30 mL

## 2011-12-27 MED ORDER — METOPROLOL SUCCINATE ER 100 MG PO TB24
100.0000 mg | ORAL_TABLET | Freq: Every day | ORAL | Status: DC
  Administered 2011-12-28 – 2011-12-29 (×2): 100 mg via ORAL
  Filled 2011-12-27 (×2): qty 1

## 2011-12-27 MED ORDER — AMLODIPINE BESYLATE 10 MG PO TABS
10.0000 mg | ORAL_TABLET | Freq: Every day | ORAL | Status: DC
  Filled 2011-12-27: qty 1

## 2011-12-27 MED ORDER — CLONAZEPAM 1 MG PO TABS
1.0000 mg | ORAL_TABLET | Freq: Two times a day (BID) | ORAL | Status: DC | PRN
Start: 2011-12-27 — End: 2011-12-29
  Administered 2011-12-27 – 2011-12-29 (×5): 1 mg via ORAL
  Filled 2011-12-27 (×5): qty 1

## 2011-12-27 SURGICAL SUPPLY — 61 items
BANDAGE ESMARK 6X9 LF (GAUZE/BANDAGES/DRESSINGS) ×1 IMPLANT
BLADE SAG 18X100X1.27 (BLADE) ×4 IMPLANT
BNDG CMPR 9X6 STRL LF SNTH (GAUZE/BANDAGES/DRESSINGS) ×1
BNDG ESMARK 6X9 LF (GAUZE/BANDAGES/DRESSINGS) ×2
BOOTCOVER CLEANROOM LRG (PROTECTIVE WEAR) ×3 IMPLANT
BOWL SMART MIX CTS (DISPOSABLE) ×2 IMPLANT
CEMENT BONE SIMPLEX SPEEDSET (Cement) ×4 IMPLANT
CLOTH BEACON ORANGE TIMEOUT ST (SAFETY) ×2 IMPLANT
COVER BACK TABLE 24X17X13 BIG (DRAPES) ×1 IMPLANT
COVER SURGICAL LIGHT HANDLE (MISCELLANEOUS) ×2 IMPLANT
CUFF TOURNIQUET SINGLE 34IN LL (TOURNIQUET CUFF) ×2 IMPLANT
DRAPE EXTREMITY T 121X128X90 (DRAPE) ×2 IMPLANT
DRAPE PROXIMA HALF (DRAPES) ×2 IMPLANT
DRAPE U-SHAPE 47X51 STRL (DRAPES) ×2 IMPLANT
DRSG PAD ABDOMINAL 8X10 ST (GAUZE/BANDAGES/DRESSINGS) ×2 IMPLANT
DURAPREP 26ML APPLICATOR (WOUND CARE) ×2 IMPLANT
ELECT CAUTERY BLADE 6.4 (BLADE) ×2 IMPLANT
ELECT REM PT RETURN 9FT ADLT (ELECTROSURGICAL) ×2
ELECTRODE REM PT RTRN 9FT ADLT (ELECTROSURGICAL) ×1 IMPLANT
EVACUATOR 1/8 PVC DRAIN (DRAIN) ×2 IMPLANT
FACESHIELD LNG OPTICON STERILE (SAFETY) ×4 IMPLANT
GAUZE XEROFORM 5X9 LF (GAUZE/BANDAGES/DRESSINGS) ×1 IMPLANT
GLOVE BIO SURGEON STRL SZ8.5 (GLOVE) ×1 IMPLANT
GLOVE BIOGEL PI IND STRL 8 (GLOVE) ×1 IMPLANT
GLOVE BIOGEL PI INDICATOR 8 (GLOVE) ×2
GLOVE ECLIPSE 8.5 STRL (GLOVE) ×1 IMPLANT
GLOVE ORTHO TXT STRL SZ7.5 (GLOVE) ×2 IMPLANT
GLOVE SURG SS PI 8.0 STRL IVOR (GLOVE) ×1 IMPLANT
GOWN PREVENTION PLUS XLARGE (GOWN DISPOSABLE) ×2 IMPLANT
GOWN PREVENTION PLUS XXLARGE (GOWN DISPOSABLE) ×1 IMPLANT
GOWN STRL NON-REIN LRG LVL3 (GOWN DISPOSABLE) ×3 IMPLANT
GOWN STRL REIN 2XL XLG LVL4 (GOWN DISPOSABLE) ×2 IMPLANT
GOWN STRL REIN XL XLG (GOWN DISPOSABLE) ×1 IMPLANT
HANDPIECE INTERPULSE COAX TIP (DISPOSABLE) ×2
IMMOBILIZER KNEE 22 UNIV (SOFTGOODS) ×1 IMPLANT
IMMOBILIZER KNEE 24 THIGH 36 (MISCELLANEOUS) IMPLANT
IMMOBILIZER KNEE 24 UNIV (MISCELLANEOUS)
KIT BASIN OR (CUSTOM PROCEDURE TRAY) ×2 IMPLANT
KIT ROOM TURNOVER OR (KITS) ×2 IMPLANT
MANIFOLD NEPTUNE II (INSTRUMENTS) ×2 IMPLANT
NS IRRIG 1000ML POUR BTL (IV SOLUTION) ×2 IMPLANT
PACK TOTAL JOINT (CUSTOM PROCEDURE TRAY) ×2 IMPLANT
PAD ARMBOARD 7.5X6 YLW CONV (MISCELLANEOUS) ×4 IMPLANT
PAD CAST 4YDX4 CTTN HI CHSV (CAST SUPPLIES) ×1 IMPLANT
PADDING CAST COTTON 4X4 STRL (CAST SUPPLIES)
PADDING CAST COTTON 6X4 STRL (CAST SUPPLIES) ×1 IMPLANT
RUBBERBAND STERILE (MISCELLANEOUS) ×2 IMPLANT
SET HNDPC FAN SPRY TIP SCT (DISPOSABLE) ×1 IMPLANT
SPONGE GAUZE 4X4 12PLY (GAUZE/BANDAGES/DRESSINGS) ×1 IMPLANT
STAPLER VISISTAT 35W (STAPLE) ×2 IMPLANT
SUCTION FRAZIER TIP 10 FR DISP (SUCTIONS) ×2 IMPLANT
SUT VIC AB 1 CTX 36 (SUTURE) ×4
SUT VIC AB 1 CTX36XBRD ANBCTR (SUTURE) ×2 IMPLANT
SUT VIC AB 2-0 CT1 27 (SUTURE) ×4
SUT VIC AB 2-0 CT1 TAPERPNT 27 (SUTURE) ×2 IMPLANT
SYR 30ML LL (SYRINGE) ×1 IMPLANT
SYR 30ML SLIP (SYRINGE) ×2 IMPLANT
TOWEL OR 17X24 6PK STRL BLUE (TOWEL DISPOSABLE) ×2 IMPLANT
TOWEL OR 17X26 10 PK STRL BLUE (TOWEL DISPOSABLE) ×2 IMPLANT
TRAY FOLEY CATH 14FR (SET/KITS/TRAYS/PACK) ×2 IMPLANT
WATER STERILE IRR 1000ML POUR (IV SOLUTION) ×5 IMPLANT

## 2011-12-27 NOTE — Anesthesia Postprocedure Evaluation (Signed)
Anesthesia Post Note  Patient: Richard Levine  Procedure(s) Performed: Procedure(s) (LRB): TOTAL KNEE ARTHROPLASTY (Left)  Anesthesia type: General  Patient location: PACU  Post pain: Pain level controlled and Adequate analgesia  Post assessment: Post-op Vital signs reviewed, Patient's Cardiovascular Status Stable, Respiratory Function Stable, Patent Airway and Pain level controlled  Last Vitals:  Filed Vitals:   12/27/11 1145  BP: 111/75  Pulse: 83  Temp:   Resp: 7    Post vital signs: Reviewed and stable  Level of consciousness: awake, alert  and oriented  Complications: No apparent anesthesia complications

## 2011-12-27 NOTE — Interval H&P Note (Signed)
History and Physical Interval Note:  12/27/2011 8:21 AM  [name]  has presented today for surgery, with the diagnosis of DJD LEFT KNEE  The various methods of treatment have been discussed with the patient and family. After consideration of risks, benefits and other options for treatment, the patient has consented to  Procedure(s) (LRB) with comments: TOTAL KNEE ARTHROPLASTY (Left) as a surgical intervention .  The patient's history has been reviewed, patient examined, no change in status, stable for surgery.  I have reviewed the patient's chart and labs.  Questions were answered to the patient's satisfaction.     Toyia Jelinek F

## 2011-12-27 NOTE — Progress Notes (Signed)
UR COMPLETED  

## 2011-12-27 NOTE — Brief Op Note (Signed)
12/27/2011  12:25 PM  PATIENT:  Richard Levine  61 y.o. male  PRE-OPERATIVE DIAGNOSIS:  DJD LEFT KNEE  POST-OPERATIVE DIAGNOSIS:  DJD LEFT KNEE  PROCEDURE:  Procedure(s) (LRB) with comments: TOTAL KNEE ARTHROPLASTY (Left) - left total knee arthroplasty  SURGEON:  Surgeon(s) and Role:    * Loreta Ave, MD - Primary  PHYSICIAN ASSISTANT: Zonia Kief M   ANESTHESIA:   regional and general  EBL:  Total I/O In: 1850 [I.V.:1850] Out: 450 [Urine:400; Blood:50]  BLOOD ADMINISTERED:none    SPECIMEN:  No Specimen  DISPOSITION OF SPECIMEN:  N/A  COUNTS:  YES  TOURNIQUET:   Total Tourniquet Time Documented: Thigh (Left) - 85 minutes  PATIENT DISPOSITION:  PACU - hemodynamically stable.

## 2011-12-27 NOTE — Anesthesia Procedure Notes (Addendum)
Procedure Name: Intubation Date/Time: 12/27/2011 8:41 AM Performed by: Darcey Nora B Pre-anesthesia Checklist: Patient identified, Emergency Drugs available, Suction available and Patient being monitored Patient Re-evaluated:Patient Re-evaluated prior to inductionOxygen Delivery Method: Circle system utilized Preoxygenation: Pre-oxygenation with 100% oxygen Intubation Type: IV induction Ventilation: Oral airway inserted - appropriate to patient size and Mask ventilation without difficulty Laryngoscope Size: Mac and 4 Grade View: Grade II Tube type: Oral Tube size: 7.5 mm Number of attempts: 1 Airway Equipment and Method: Stylet Placement Confirmation: ETT inserted through vocal cords under direct vision,  breath sounds checked- equal and bilateral and positive ETCO2 Secured at: 21 (cm at teeth) cm Tube secured with: Tape Dental Injury: Teeth and Oropharynx as per pre-operative assessment    Anesthesia Regional Block:  Femoral nerve block  Pre-Anesthetic Checklist: ,, timeout performed, Correct Patient, Correct Site, Correct Laterality, Correct Procedure,, site marked, risks and benefits discussed, Surgical consent,  Pre-op evaluation,  At surgeon's request and post-op pain management  Laterality: Left  Prep: chloraprep       Needles:  Injection technique: Single-shot  Needle Type: Echogenic Stimulator Needle     Needle Length: 9cm  Needle Gauge: 21    Additional Needles:  Procedures: nerve stimulator Femoral nerve block  Nerve Stimulator or Paresthesia:  Response: Quadriceps muscle contraction, 0.45 mA,   Additional Responses:   Narrative:  Start time: 12/27/2011 7:58 AM End time: 12/27/2011 8:16 AM Injection made incrementally with aspirations every 5 mL.  Performed by: Personally  Anesthesiologist: Dr Chaney Malling  Additional Notes: Functioning IV was confirmed and monitors were applied.  A 90mm 21ga Arrow echogenic stimulator needle was used. Sterile prep and  drape,hand hygiene and sterile gloves were used.  Negative aspiration and negative test dose prior to incremental administration of local anesthetic. The patient tolerated the procedure well.    Femoral nerve block

## 2011-12-27 NOTE — Progress Notes (Signed)
Orthopedic Tech Progress Note Patient Details:  Richard Levine November 07, 1950 [id number]  CPM Left Knee CPM Left Knee: On Left Knee Flexion (Degrees): 60  Left Knee Extension (Degrees): 0  Additional Comments: trapeze bar   Cammer, Mickie Bail 12/27/2011, 12:50 PM

## 2011-12-27 NOTE — Progress Notes (Signed)
Hemovac clamped

## 2011-12-27 NOTE — Telephone Encounter (Signed)
Patient is coming home 12/29/11 from total knee replacement. Dr Eulah Pont has prescribed Coumadin for the next 2 weeks. [facility] wants to know if Dr Milinda Cave will be okay with following the patient's PT INR?

## 2011-12-27 NOTE — Preoperative (Signed)
Beta Blockers   Reason not to administer Beta Blockers:Toprol 0530 today 
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
## 2011-12-27 NOTE — Transfer of Care (Signed)
Immediate Anesthesia Transfer of Care Note  Patient: Richard Levine  Procedure(s) Performed: Procedure(s) (LRB) with comments: TOTAL KNEE ARTHROPLASTY (Left) - left total knee arthroplasty  Patient Location: PACU  Anesthesia Type: General  Level of Consciousness: awake and sedated  Airway & Oxygen Therapy: Patient Spontanous Breathing and Patient connected to face mask oxygen  Post-op Assessment: Report given to PACU RN, Post -op Vital signs reviewed and stable and Patient moving all extremities  Post vital signs: Reviewed and stable  Complications: No apparent anesthesia complications

## 2011-12-27 NOTE — Progress Notes (Signed)
Pt has not used PCA in PACU. No Hx to clear.

## 2011-12-27 NOTE — Anesthesia Preprocedure Evaluation (Addendum)
Anesthesia Evaluation  Patient identified by MRN, date of birth, ID band Patient awake    Reviewed: Allergy & Precautions, H&P , NPO status , Patient's Chart, lab work & pertinent test results, reviewed documented beta blocker date and time   Airway Mallampati: II TM Distance: >3 FB Neck ROM: full    Dental  (+) Teeth Intact, Caps and Dental Advisory Given   Pulmonary          Cardiovascular hypertension, Pt. on home beta blockers and Pt. on medications     Neuro/Psych Seizures -, Well Controlled,  Anxiety Depression    GI/Hepatic GERD-  Controlled,  Endo/Other  obese  Renal/GU      Musculoskeletal   Abdominal   Peds  Hematology   Anesthesia Other Findings Caps front top X 2;  Goatee, mustache  Reproductive/Obstetrics                         Anesthesia Physical Anesthesia Plan  ASA: III  Anesthesia Plan: General and Regional   Post-op Pain Management: MAC Combined w/ Regional for Post-op pain   Induction: Intravenous  Airway Management Planned: LMA  Additional Equipment:   Intra-op Plan:   Post-operative Plan:   Informed Consent: I have reviewed the patients History and Physical, chart, labs and discussed the procedure including the risks, benefits and alternatives for the proposed anesthesia with the patient or authorized representative who has indicated his/her understanding and acceptance.     Plan Discussed with: CRNA and Surgeon  Anesthesia Plan Comments:         Anesthesia Quick Evaluation

## 2011-12-28 ENCOUNTER — Inpatient Hospital Stay (HOSPITAL_COMMUNITY): Payer: Medicare Other

## 2011-12-28 ENCOUNTER — Other Ambulatory Visit: Payer: Self-pay | Admitting: Family Medicine

## 2011-12-28 ENCOUNTER — Encounter (HOSPITAL_COMMUNITY): Payer: Self-pay | Admitting: Orthopedic Surgery

## 2011-12-28 DIAGNOSIS — R7401 Elevation of levels of liver transaminase levels: Secondary | ICD-10-CM

## 2011-12-28 LAB — CBC
HCT: 35.7 % — ABNORMAL LOW (ref 39.0–52.0)
Hemoglobin: 12.5 g/dL — ABNORMAL LOW (ref 13.0–17.0)
WBC: 10.3 10*3/uL (ref 4.0–10.5)

## 2011-12-28 LAB — BASIC METABOLIC PANEL
BUN: 10 mg/dL (ref 6–23)
Chloride: 89 mEq/L — ABNORMAL LOW (ref 96–112)
Glucose, Bld: 128 mg/dL — ABNORMAL HIGH (ref 70–99)
Potassium: 3.6 mEq/L (ref 3.5–5.1)

## 2011-12-28 LAB — PROTIME-INR: INR: 1.31 (ref 0.00–1.49)

## 2011-12-28 MED ORDER — OXYCODONE HCL 5 MG PO TABS
5.0000 mg | ORAL_TABLET | ORAL | Status: DC | PRN
  Administered 2011-12-28 – 2011-12-29 (×5): 5 mg via ORAL
  Filled 2011-12-28 (×5): qty 1

## 2011-12-28 MED ORDER — MORPHINE SULFATE ER 15 MG PO TBCR
30.0000 mg | EXTENDED_RELEASE_TABLET | Freq: Two times a day (BID) | ORAL | Status: DC
  Administered 2011-12-28: 30 mg via ORAL
  Filled 2011-12-28: qty 2

## 2011-12-28 MED ORDER — MORPHINE SULFATE ER 15 MG PO TBCR
30.0000 mg | EXTENDED_RELEASE_TABLET | Freq: Three times a day (TID) | ORAL | Status: DC
  Administered 2011-12-28 – 2011-12-29 (×3): 30 mg via ORAL
  Filled 2011-12-28 (×3): qty 2

## 2011-12-28 MED ORDER — OXYCODONE HCL 5 MG PO TABS
ORAL_TABLET | ORAL | Status: AC
  Administered 2011-12-28: 09:00:00
  Filled 2011-12-28: qty 1

## 2011-12-28 MED ORDER — LISINOPRIL 5 MG PO TABS
5.0000 mg | ORAL_TABLET | Freq: Every day | ORAL | Status: DC
  Administered 2011-12-28 – 2011-12-29 (×2): 5 mg via ORAL
  Filled 2011-12-28 (×2): qty 1

## 2011-12-28 MED ORDER — WARFARIN SODIUM 7.5 MG PO TABS
7.5000 mg | ORAL_TABLET | Freq: Once | ORAL | Status: AC
  Administered 2011-12-28: 7.5 mg via ORAL
  Filled 2011-12-28: qty 1

## 2011-12-28 NOTE — Progress Notes (Signed)
Referral received for SNF. Chart reviewed and CSW has spoken with RNCM who indicates that patient is for DC to home with Home Health and DME.  CSW to sign off. Please re-consult if CSW needs arise.  Toi Stelly T. Onalee Steinbach, LCSWA  [phone]  

## 2011-12-28 NOTE — Progress Notes (Signed)
I agree with the following treatment note after reviewing documentation.   [name], [name]   OTR/L Pager: [phone] Office: [phone] .   

## 2011-12-28 NOTE — Progress Notes (Signed)
Orthopedic Tech Progress Note Patient Details:  Richard Levine 01-05-1951 [id number] Upon verification of Ortho Tech orders it was discovered that patient has already been issued a Knee Immobilizer. No action needed. Patient ID: Richard Levine, male   DOB: [birth year], 61 y.o.   MRN: [id number]   [name] 12/28/2011, 12:44 PM

## 2011-12-28 NOTE — Progress Notes (Signed)
Occupational Therapy Evaluation Patient Details Name: Richard Levine MRN: [id number] DOB: 11/21/1950 Today's Date: 12/28/2011 Time: 7829-5621 OT Time Calculation (min): 24 min  OT Assessment / Plan / Recommendation Clinical Impression  Pt. 61 yo male s/p left TKA. Pt. demonstrates supervision to complete ADL's at sink level and for transfers. Pt. able to dress LB with min (A) due to knee immobilizer and woundvac. Pt. would benefit from OT acutely. Plan to address tub transfer tomorrow.     OT Assessment  Patient needs continued OT Services    Follow Up Recommendations  Home health OT    Barriers to Discharge      Equipment Recommendations  None recommended by OT    Recommendations for Other Services    Frequency  Min 2X/week    Precautions / Restrictions Precautions Precautions: Knee Required Braces or Orthoses: Knee Immobilizer - Left Knee Immobilizer - Left: On at all times Restrictions Weight Bearing Restrictions: Yes LLE Weight Bearing: Weight bearing as tolerated   Pertinent Vitals/Pain No number given, Pt. Just stated his knee hurt.     ADL  Grooming: Wash/dry hands;Supervision/safety Where Assessed - Grooming: Unsupported standing Upper Body Bathing: Simulated;Supervision/safety Where Assessed - Upper Body Bathing: Supported sitting Lower Body Bathing: Simulated;Minimal assistance Where Assessed - Lower Body Bathing: Supported sit to stand Upper Body Dressing: Performed;Supervision/safety Where Assessed - Upper Body Dressing: Supported sitting Lower Body Dressing: Performed;Minimal assistance Where Assessed - Lower Body Dressing: Supported sit to Pharmacist, hospital: Research scientist (life sciences) Method: Sit to Barista: Raised toilet seat with arms (or 3-in-1 over toilet) Toileting - Clothing Manipulation and Hygiene: Performed;Independent Where Assessed - Toileting Clothing Manipulation and Hygiene: Standing Equipment  Used: Knee Immobilizer;Rolling walker Transfers/Ambulation Related to ADLs: Pt. supervision for transfer on and off of toilet. Ambulated to bedroom door and bathroom. with supervision for safety.  ADL Comments: Pt. completed LB dressing with min (A) due to knee immobilizer and wound vac. pt. able to transfer sit<>stand and stand<> sit onto toilet with supervision. All grooming tasks were completed standing at sink level. Will address tub transfer tomorrow.     OT Diagnosis: Generalized weakness;Acute pain  OT Problem List:   OT Treatment Interventions: Self-care/ADL training;Therapeutic exercise;DME and/or AE instruction;Patient/family education   OT Goals Acute Rehab OT Goals OT Goal Formulation: With patient Time For Goal Achievement: 01/11/12 Potential to Achieve Goals: Good ADL Goals Pt Will Perform Tub/Shower Transfer: Tub transfer;with min assist;Transfer tub bench ADL Goal: Tub/Shower Transfer - Progress: Goal set today  Visit Information  Last OT Received On: 12/28/11 Assistance Needed: +1 PT/OT Co-Evaluation/Treatment: Yes    Subjective Data  Subjective: My machine at home is better than this one they have here.  Patient Stated Goal: To go home    Prior Functioning     Home Living Lives With: Spouse Available Help at Discharge: Family;Available 24 hours/day Type of Home: House Home Access: Stairs to enter [employer] of Steps: 2 Entrance Stairs-Rails: None Home Layout: Two level;Able to live on main level with bedroom/bathroom Alternate Level Stairs-Number of Steps: 12 Alternate Level Stairs-Rails: Right Bathroom Shower/Tub: Tub/shower unit;Door [employer] Toilet: Standard Bathroom Accessibility: Yes How Accessible: Accessible via walker Home Adaptive Equipment: Walker - rolling Prior Function Level of Independence: Independent Able to Take Stairs?: Yes Driving: Yes Vocation: Retired Musician: No difficulties Dominant Hand:  Right         Vision/Perception     Cognition  Overall Cognitive Status: Appears within functional limits for tasks assessed/performed  Arousal/Alertness: Awake/alert Orientation Level: Appears intact for tasks assessed Behavior During Session: [facility] for tasks performed    Extremity/Trunk Assessment Right Upper Extremity Assessment RUE ROM/Strength/Tone: Within functional levels Left Upper Extremity Assessment LUE ROM/Strength/Tone: Within functional levels     Mobility Bed Mobility Bed Mobility: Sit to Supine;Scooting to HOB Sit to Supine: 4: Min assist Scooting to [facility]: With trapeze;4: Min guard Details for Bed Mobility Assistance: (A) with supporting LLE due to pain.  Transfers Transfers: Sit to Stand;Stand to Sit Sit to Stand: 5: Supervision;With upper extremity assist;From chair/3-in-1 Stand to Sit: 5: Supervision;With armrests;To chair/3-in-1;To bed;With upper extremity assist Details for Transfer Assistance: Verbal cues to bring walker all the way over toilet when standing to urinate. Cues to step all the way back until he felt the toilet against his legs before sitting down.                     End of Session OT - End of Session Equipment Utilized During Treatment: Left knee immobilizer Activity Tolerance: Patient tolerated treatment well Patient left: in bed;with call bell/phone within reach;with family/visitor present;Other (comment) (with PT in room ) Nurse Communication: Mobility status;Weight bearing status  GO     [name] 12/28/2011, 2:42 PM

## 2011-12-28 NOTE — Consult Note (Signed)
Pharmacy Consult-Anticoagulation  Pharmacy Consult for Coumadin:  61 y.o.male admitted for DJD LEFT KNEE s/p L-TKA who is on Coumadin for VTE prophylaxis    Current Labs: Hematology  [facility] 12/28/11 0610  HGB 12.5*  HCT 35.7*  PLT 194  LABPROT 16.0*  INR 1.31  CREATININE 0.97   Lab Results  Component Value Date   INR 1.31 12/28/2011   INR 1.02 12/22/2011   Estimated Creatinine Clearance: 103.5 ml/min (by C-G formula based on Cr of 0.97).  Assessment:  INR is 1.31 following a single dose of Coumadin.  No bleeding complications observed.  Goal/Plan:  INR goal is 2-3.  Coumadin 7.5 mg po today.  Daily INR's, CBC.  Janis Sol, [name] Headland, Pharm.D. 12/28/2011  2:51 PM

## 2011-12-28 NOTE — Care Management Note (Signed)
    Page 1 of 1   12/28/2011     2:02:46 PM   CARE MANAGEMENT NOTE 12/28/2011  Patient:  Richard Levine, Richard Levine   Account Number:  [id number]  Date Initiated:  12/28/2011  Documentation initiated by:  Anette Guarneri  Subjective/Objective Assessment:   POD#1 s/p left TKA  MD office has prearranged  [facility] services with [facility]  DME at home     Action/Plan:   home with [facility] services   Anticipated DC Date:  12/30/2011   Anticipated DC Plan:  HOME W HOME HEALTH SERVICES      DC Planning Services  CM consult      Choice offered to / List presented to:             [facility] agency  [facility]   Status of service:  Completed, signed off Medicare Important Message given?   (If response is "NO", the following Medicare IM given date fields will be blank) Date Medicare IM given:   Date Additional Medicare IM given:    Discharge Disposition:    Per UR Regulation:  Reviewed for med. necessity/level of care/duration of stay  If discussed at Long Length of Stay Meetings, dates discussed:    Comments:  12/28/11  11:30 Anette Guarneri RN/CM Spoke with patient regarding d/c needs. Per [name], MD office arranged [facility] services with [facility] and per patient he has RW/3n1 and CPM at home. Contacted [facility] and confirmed that patient has been accepted by  them for HHPT/RN to begin after discharge.

## 2011-12-28 NOTE — Progress Notes (Signed)
Subjective: C/o knee pain.  Not greatly controlled with dilaudid high dose pca last night.  Hx of chronic pain management with ms contin and oxy ir given by pain clinic.     Objective: Vital signs in last 24 hours: Temp:  [97.8 F (36.6 C)-98.6 F (37 C)] 98.6 F (37 C) (10/03 0624) Pulse Rate:  [79-102] 100  (10/03 0624) Resp:  [7-19] 18  (10/03 0624) BP: (91-145)/(54-89) 145/89 mmHg (10/03 0624) SpO2:  [75 %-100 %] 95 % (10/03 0624) Weight:  [113.2 kg (249 lb 9 oz)] 113.2 kg (249 lb 9 oz) (10/02 1432)  Intake/Output from previous day: 10/02 0701 - 10/03 0700 In: 3290 [P.O.:360; I.V.:2930] Out: 1900 [Urine:1500; Drains:350; Blood:50] Intake/Output this shift:    No results found for this basename: HGB:5 in the last 72 hours No results found for this basename: WBC:2,RBC:2,HCT:2,PLT:2 in the last 72 hours No results found for this basename: NA:2,K:2,CL:2,CO2:2,BUN:2,CREATININE:2,GLUCOSE:2,CALCIUM:2 in the last 72 hours No results found for this basename: LABPT:2,INR:2 in the last 72 hours  Exam:  Dressing c/d/i.  Calf nt nvi.  Patient appears comfortable and sitting in chair.    Assessment/Plan: Had long discussion with patient and wife who is present in regards to pain management.  Will d/c dilaudid pca and restart ms contin and oxy ir.  May need to make small adjustments to oxy ir and robaxin if needed  Advised that we may need the pain clinic to manage him after d/c home.  Start PT.     Zonia Kief M 12/28/2011, 8:42 AM

## 2011-12-28 NOTE — Progress Notes (Signed)
Physical Therapy Treatment Patient Details Name: JOS CYGAN MRN: [id number] DOB: 01-23-1951 Today's Date: 12/28/2011 Time: 7829-5621 PT Time Calculation (min): 39 min  PT Assessment / Plan / Recommendation Comments on Treatment Session  Pt still limited by pain, declined exercises this session, although agreed to end session in CPM for increased ROM. Continue per plan tomorrow    Follow Up Recommendations  Home health PT;Supervision for mobility/OOB    Barriers to Discharge        Equipment Recommendations  None recommended by OT;None recommended by PT    Recommendations for Other Services    Frequency 7X/week   Plan Discharge plan remains appropriate;Frequency remains appropriate    Precautions / Restrictions Precautions Precautions: Knee Required Braces or Orthoses: Knee Immobilizer - Left Knee Immobilizer - Left: On at all times Restrictions Weight Bearing Restrictions: Yes LLE Weight Bearing: Weight bearing as tolerated   Pertinent Vitals/Pain Pt with 8/10 pain. RN aware.    Mobility  Bed Mobility Bed Mobility: Sit to Supine;Scooting to HOB Sit to Supine: 4: Min assist Scooting to [facility]: With trapeze;4: Min guard Details for Bed Mobility Assistance: (A) with supporting LLE due to pain.  Transfers Transfers: Sit to Stand;Stand to Sit Sit to Stand: 5: Supervision;With upper extremity assist;From chair/3-in-1 Stand to Sit: 5: Supervision;With armrests;To chair/3-in-1;To bed;With upper extremity assist Details for Transfer Assistance: Verbal cues to bring walker all the way over toilet when standing to urinate. Cues to step all the way back until he felt the toilet against his legs before sitting down.  Ambulation/Gait Ambulation/Gait Assistance: 4: Min guard Ambulation Distance (Feet): 50 Feet Assistive device: Rolling walker Ambulation/Gait Assistance Details: VC for gait sequencing and safe distance to RW. Pt holding breath during painful movements, encouraged to  complete deep breaths. Distance limited by pain Gait Pattern: Step-to pattern;Decreased step length - right;Decreased stance time - left;Decreased hip/knee flexion - left Gait velocity: decreased gait speed    Exercises     PT Diagnosis:    PT Problem List:   PT Treatment Interventions:     PT Goals Acute Rehab PT Goals PT Goal: Supine/Side to Sit - Progress: Progressing toward goal PT Goal: Sit to Supine/Side - Progress: Progressing toward goal PT Goal: Sit to Stand - Progress: Progressing toward goal PT Goal: Stand to Sit - Progress: Progressing toward goal PT Transfer Goal: Bed to Chair/Chair to Bed - Progress: Progressing toward goal PT Goal: Ambulate - Progress: Progressing toward goal  Visit Information  Last PT Received On: 12/28/11 Assistance Needed: +1    Subjective Data      Cognition  Overall Cognitive Status: Appears within functional limits for tasks assessed/performed Arousal/Alertness: Awake/alert Orientation Level: Appears intact for tasks assessed Behavior During Session: [facility] for tasks performed    Balance     End of Session PT - End of Session Equipment Utilized During Treatment: Gait belt;Left knee immobilizer Activity Tolerance: Patient limited by pain Patient left: in bed;in CPM;with call bell/phone within reach;with family/visitor present (CPM 0-55) Nurse Communication: Mobility status   GP     Milana Kidney 12/28/2011, 3:06 PM

## 2011-12-28 NOTE — Telephone Encounter (Signed)
Discussed this with [name] of [facility] today in lobby of the office. I agreed to monitor/manage Richard Levine coumadin when he comes home from his TKR. The protocol she has from [facility] [place] [facility] states that this coumadin dosing and INR monitoring will be for a 4 week period starting from the date of his surgery.-PM

## 2011-12-28 NOTE — Progress Notes (Signed)
Pt was assisted OOB last night to chair with minimum assistance. Ambulated approximately 5 feet to recliner with walker and standby assist, sat in chair for 2 hours. Tolerated well. Assisted back to bed but he was unable to get comfortable in bed. Helped to turn on both sides, repositioned several times but very uncomfortable and unable to sleep. He stated it wasn't so much pain in his knee, but just feeling "uncomfortable" r/t his chronic back pain and also from having to keep L knee straight. Eventually assisted back up to chair, immobilizer on, and is currently resting comfortably while reclined back and legs elevated. Will continue to monitor.

## 2011-12-28 NOTE — Evaluation (Signed)
Physical Therapy Evaluation Patient Details Name: Richard Levine MRN: [id number] DOB: 1950-05-28 Today's Date: 12/28/2011 Time: 4696-2952 PT Time Calculation (min): 29 min  PT Assessment / Plan / Recommendation Clinical Impression  Pt s/p left TKA. Pt with good mobility, limited mostly secondary to pain. Pt will benefit from skilled PT in the acute care setting in order to maximize functional mobility, strength and safety prior to d/c home with wife.     PT Assessment  Patient needs continued PT services    Follow Up Recommendations  Home health PT;Supervision for mobility/OOB    Barriers to Discharge        Equipment Recommendations  None recommended by PT    Recommendations for Other Services     Frequency 7X/week    Precautions / Restrictions Precautions Precautions: Knee Required Braces or Orthoses: Knee Immobilizer - Left Knee Immobilizer - Left: On at all times Restrictions Weight Bearing Restrictions: Yes LLE Weight Bearing: Weight bearing as tolerated   Pertinent Vitals/Pain Pain 6/10. PCA encouraged.       Mobility  Bed Mobility Bed Mobility: Supine to Sit;Sitting - Scoot to Edge of Bed Supine to Sit: 4: Min assist;With rails;HOB elevated Sitting - Scoot to Edge of Bed: 5: Supervision Details for Bed Mobility Assistance: VC for proper sequencing. Min assist with LLE for support.  Transfers Transfers: Sit to Stand;Stand to Sit Sit to Stand: 4: Min assist;With upper extremity assist;From bed Stand to Sit: 4: Min assist;With upper extremity assist;To chair/3-in-1 Details for Transfer Assistance: VC for proper hand placement and safety to/from RW. Pt with uncontrolled descent into chair, assist with LLE upon standing and sitting to decrease pain.  Ambulation/Gait Ambulation/Gait Assistance: 4: Min assist Ambulation Distance (Feet): 30 Feet Assistive device: Rolling walker Ambulation/Gait Assistance Details: VC for proper gait sequencing and safety with RW. Pt  with good step length, cueing for increased breathing pattern during ambulation Gait Pattern: Step-to pattern;Decreased step length - right;Decreased stance time - left;Decreased hip/knee flexion - left Gait velocity: decreased gait speed Stairs: No    Shoulder Instructions     Exercises Total Joint Exercises Ankle Circles/Pumps: AROM;Strengthening;Both;10 reps;Supine Quad Sets: AROM;Strengthening;Left;10 reps;Supine   PT Diagnosis: Difficulty walking;Acute pain  PT Problem List: Decreased strength;Decreased range of motion;Decreased activity tolerance;Decreased mobility;Decreased knowledge of use of DME;Decreased safety awareness;Decreased knowledge of precautions;Pain PT Treatment Interventions: DME instruction;Gait training;Stair training;Functional mobility training;Therapeutic activities;Therapeutic exercise;Patient/family education   PT Goals Acute Rehab PT Goals PT Goal Formulation: With patient Time For Goal Achievement: 01/04/12 Potential to Achieve Goals: Good Pt will go Supine/Side to Sit: with modified independence PT Goal: Supine/Side to Sit - Progress: Goal set today Pt will go Sit to Supine/Side: with modified independence PT Goal: Sit to Supine/Side - Progress: Goal set today Pt will go Sit to Stand: with modified independence PT Goal: Sit to Stand - Progress: Goal set today Pt will go Stand to Sit: with modified independence PT Goal: Stand to Sit - Progress: Goal set today Pt will Transfer Bed to Chair/Chair to Bed: with supervision PT Transfer Goal: Bed to Chair/Chair to Bed - Progress: Goal set today Pt will Ambulate: >150 feet;with supervision;with least restrictive assistive device PT Goal: Ambulate - Progress: Goal set today Pt will Go Up / Down Stairs: 1-2 stairs;with min assist;with rolling walker PT Goal: Up/Down Stairs - Progress: Goal set today Pt will Perform Home Exercise Program: Independently PT Goal: Perform Home Exercise Program - Progress: Goal set  today  Visit Information  Last PT Received On:  12/28/11 Assistance Needed: +1    Subjective Data      Prior Functioning  Home Living Lives With: Spouse Available Help at Discharge: Family;Available 24 hours/day Type of Home: House Home Access: Stairs to enter [employer] of Steps: 2 Entrance Stairs-Rails: None Home Layout: Two level;Able to live on main level with bedroom/bathroom Alternate Level Stairs-Number of Steps: 12 Alternate Level Stairs-Rails: Right Bathroom Shower/Tub: Tub/shower unit;Door [employer] Toilet: Standard Bathroom Accessibility: Yes How Accessible: Accessible via walker Home Adaptive Equipment: Walker - rolling Prior Function Level of Independence: Independent Able to Take Stairs?: Yes Driving: Yes Vocation: Retired Musician: No difficulties Dominant Hand: Right    Cognition  Overall Cognitive Status: Appears within functional limits for tasks assessed/performed Arousal/Alertness: Awake/alert Orientation Level: Appears intact for tasks assessed Behavior During Session: [facility] for tasks performed    Extremity/Trunk Assessment Right Lower Extremity Assessment RLE ROM/Strength/Tone: Within functional levels RLE Sensation: WFL - Light Touch Left Lower Extremity Assessment LLE ROM/Strength/Tone: Deficits;Unable to fully assess;Due to pain LLE ROM/Strength/Tone Deficits: Pt able to complete SLR with KI. Strong quad set. Hip and Ankle WFL LLE Sensation: WFL - Light Touch   Balance    End of Session PT - End of Session Equipment Utilized During Treatment: Gait belt;Left knee immobilizer Activity Tolerance: Patient limited by pain Patient left: in chair;with call bell/phone within reach;with family/visitor present Nurse Communication: Mobility status CPM Left Knee CPM Left Knee: Off  GP     Milana Kidney 12/28/2011, 9:48 AM  12/28/2011 Milana Kidney DPT PAGER: [phone] OFFICE: [id number]

## 2011-12-29 LAB — CBC
MCH: 30.9 pg (ref 26.0–34.0)
MCHC: 33.7 g/dL (ref 30.0–36.0)
Platelets: 188 10*3/uL (ref 150–400)

## 2011-12-29 LAB — BASIC METABOLIC PANEL
Calcium: 9 mg/dL (ref 8.4–10.5)
GFR calc non Af Amer: 80 mL/min — ABNORMAL LOW (ref >90)
Glucose, Bld: 132 mg/dL — ABNORMAL HIGH (ref 70–99)
Sodium: 130 mEq/L — ABNORMAL LOW (ref 135–145)

## 2011-12-29 LAB — PROTIME-INR: Prothrombin Time: 18.3 seconds — ABNORMAL HIGH (ref 11.6–15.2)

## 2011-12-29 MED ORDER — METHOCARBAMOL 500 MG PO TABS: 500.0000 mg | ORAL_TABLET | Freq: Four times a day (QID) | ORAL | Status: DC | PRN

## 2011-12-29 MED ORDER — OXYCODONE HCL 5 MG PO TABS: 5.0000 mg | ORAL_TABLET | ORAL | Status: DC | PRN

## 2011-12-29 MED ORDER — SODIUM CHLORIDE 0.9 % IV BOLUS (SEPSIS): 500.0000 mL | Freq: Once | INTRAVENOUS | Status: DC

## 2011-12-29 MED ORDER — ENOXAPARIN SODIUM 30 MG/0.3ML ~~LOC~~ SOLN: 30.0000 mg | Freq: Two times a day (BID) | SUBCUTANEOUS | Status: DC

## 2011-12-29 NOTE — Op Note (Signed)
NAME:  Richard Levine, FATIMA NO.:  [id number]  MEDICAL RECORD NO.:  [id number]  LOCATION:  5N28C                        FACILITY:  [facility]  PHYSICIAN:  Loreta Ave, M.D. DATE OF BIRTH:  01-30-1951  DATE OF PROCEDURE:  12/27/2011 DATE OF DISCHARGE:                              OPERATIVE REPORT   PREOPERATIVE DIAGNOSIS:  Left knee end-stage degenerative arthritis, varus alignment, mild flexion contracture.  POSTOPERATIVE DIAGNOSIS:  Left knee end-stage degenerative arthritis, varus alignment, mild flexion contracture.  PROCEDURE:  Modified minimally invasive left total knee replacement with Stryker Triathlon prosthesis.  Soft tissue balancing.  Cemented pegged posterior stabilized #4 femoral component.  Cemented #5 tibial component, 9-mm polyethylene insert.  Cemented resurfacing 35-mm patellar component.  SURGEON:  Loreta Ave, M.D.  ASSISTANT:  Genene Churn. Barry Dienes, [place], present throughout the entire case, necessary for timely completion of procedure.  ANESTHESIA:  General.  BLOOD LOSS:  Minimal.  SPECIMENS:  None.  CULTURES:  None.  COMPLICATIONS:  None.  DRESSING:  Soft compressive with knee immobilizer.  DRAINS:  Hemovac x1.  TOURNIQUET TIME:  1 hour.  PROCEDURE:  The patient was brought to the operating room and placed on the operating table in supine position.  After adequate anesthesia had been obtained, knee examined.  About a 5-degree flexion contracture, more than 5 of varus, flexion of 100.  Tourniquet applied, prepped and draped in usual sterile fashion.  Exsanguinated with elevation of Esmarch.  Tourniquet was inflated to 350 mmHg.  Straight incision above the patella down to tibial tubercle.  Hemostasis with cautery.  Medial arthrotomy, vastus splitting.  Medial capsule release.  Knee exposed. Grade 4 changes throughout.  Remnants of menisci, loose body, cruciate ligaments removed.  Intramedullary guide, distal femur.  A 5 degrees  of valgus, 10-mm resection.  Using epicondylar axis, the femur was sized, cut, and fitted for a posterior stabilized pegged #4 component. Proximal tibial resection below the defect medially.  Brought down and little bit of sclerotic bone, medial side.  There was also a significant geode cyst in the subchondral area at the very margin of the tibial plateau.  Did not really compromise stability and ambulatory status, that was cleared out.  Patella exposed.  Posterior 10-mm was removed. Drilled, sized, and fitted for a 35-mm patella.  Trials were put in place throughout.  A #4 on the femur, 5 on the tibia, 9 insert, 35 patella.  With this construct, very nice biomechanical axis.  Good patellofemoral tracking.  Nicely balanced in flexion and extension with good stability.  Tibia was marked for rotation, hand reamed.  All trials were removed.  Copious irrigation with pulse irrigating device.  Cement prepared, placed on all components.  I also cemented down into the small cyst in the tibia.  Once the components were seated, polyethylene attached, knee reduced.  Patella held with a clamp.  After the cement hardened, again I was pleased with alignment, stability, and motion. Hemovac was placed and brought out through a separate stab wound. Arthrotomy was closed with #1 Vicryl, skin and subcutaneous tissue with Vicryl and staples.  Sterile compressive dressing applied.  Tourniquet was deflated and removed.  Knee  immobilizer applied.  Anesthesia reversed.  Brought to the recovery room.  Tolerated the surgery well. No complications.     Loreta Ave, M.D.     DFM/MEDQ  D:  12/28/2011  T:  12/29/2011  Job:  [id number]

## 2011-12-29 NOTE — Progress Notes (Signed)
I agree with the following treatment note after reviewing documentation.   [name], Kylar Speelman Brynn   OTR/L Pager: [phone] Office: [phone] .   

## 2011-12-29 NOTE — Progress Notes (Signed)
Occupational Therapy Discharge Patient Details Name: Richard Levine MRN: [id number] DOB: 1951/03/10 Today's Date: 12/29/2011 Time: 1209-1223 OT Time Calculation (min): 14 min  Patient discharged from OT services secondary to Pt. and wife comfortable with tub transfers and pt. able to complete UB and LB dressing with supervision. Once home pt. will have wife to (A) with ADL needs.   Please see latest therapy progress note for current level of functioning and progress toward goals.    Progress and discharge plan discussed with patient and/or caregiver: Patient/Caregiver agrees with plan  GO     [name] 12/29/2011, 12:59 PM

## 2011-12-29 NOTE — Progress Notes (Signed)
Physical Therapy Treatment Patient Details Name: CREE KUNERT MRN: [id number] DOB: [birth year] Today's Date: 12/29/2011 Time: 1478-2956 PT Time Calculation (min): 39 min  PT Assessment / Plan / Recommendation Comments on Treatment Session  Pt progressing well this session, pain tolerable. Pt able to compelte increased ambulation and stairs. Continue per plan prior to d/c safely with wife.     Follow Up Recommendations  Home health PT;Supervision for mobility/OOB    Barriers to Discharge        Equipment Recommendations  None recommended by OT;None recommended by PT    Recommendations for Other Services    Frequency 7X/week   Plan Discharge plan remains appropriate;Frequency remains appropriate    Precautions / Restrictions Precautions Precautions: Knee Required Braces or Orthoses: Knee Immobilizer - Left Knee Immobilizer - Left: On at all times Restrictions Weight Bearing Restrictions: Yes LLE Weight Bearing: Weight bearing as tolerated   Pertinent Vitals/Pain Pt with minimal pain complaints.     Mobility  Bed Mobility Bed Mobility: Supine to Sit;Sitting - Scoot to Edge of Bed Supine to Sit: 5: Supervision;HOB flat Sitting - Scoot to Edge of Bed: 5: Supervision Details for Bed Mobility Assistance: Pt able to perform bed mobility with no handrails and bed flat with KI on. No assistance given, although cues for proper sequencing and ease Transfers Transfers: Sit to Stand;Stand to Sit Sit to Stand: 5: Supervision;With upper extremity assist;From bed Stand to Sit: 5: Supervision;With upper extremity assist;To chair/3-in-1 Details for Transfer Assistance: Pt still with increased difficulty to achieve anterior translation into standing, although he is able to complete without assitance. Cueing for hand placement and sequencing.  Ambulation/Gait Ambulation/Gait Assistance: 5: Supervision Ambulation Distance (Feet): 200 Feet Assistive device: Rolling walker Ambulation/Gait  Assistance Details: Improvements in fluidity of gait this session, decreased pain complaints. Pt still requires cueing for safety with RW. Gait Pattern: Step-to pattern;Decreased step length - right;Decreased stance time - left;Decreased hip/knee flexion - left Gait velocity: decreased gait speed Stairs: Yes Stairs Assistance: 5: Supervision Stairs Assistance Details (indicate cue type and reason): Patient and wife educated on safe technique for stairs as well as wife educated and demonstrated assistance with RW on stairs.  Stair Management Technique: No rails;Backwards;With walker;Step to pattern Number of Stairs: 2     Exercises Total Joint Exercises Heel Slides: AAROM;Strengthening;Left;10 reps;Seated Long Arc Quad: AROM;Strengthening;Left;10 reps;Seated   PT Diagnosis:    PT Problem List:   PT Treatment Interventions:     PT Goals Acute Rehab PT Goals PT Goal: Supine/Side to Sit - Progress: Progressing toward goal PT Goal: Sit to Supine/Side - Progress: Progressing toward goal PT Goal: Sit to Stand - Progress: Progressing toward goal PT Goal: Stand to Sit - Progress: Progressing toward goal PT Transfer Goal: Bed to Chair/Chair to Bed - Progress: Met PT Goal: Ambulate - Progress: Met PT Goal: Up/Down Stairs - Progress: Met  Visit Information  Last PT Received On: 12/29/11 Assistance Needed: +1    Subjective Data      Cognition  Overall Cognitive Status: Appears within functional limits for tasks assessed/performed Arousal/Alertness: Awake/alert Orientation Level: Appears intact for tasks assessed Behavior During Session: [facility] for tasks performed    Balance     End of Session PT - End of Session Equipment Utilized During Treatment: Gait belt;Left knee immobilizer Activity Tolerance: Patient limited by pain Patient left: in chair;with call bell/phone within reach;with family/visitor present Nurse Communication: Mobility status CPM Left Knee Additional Comments: trapeze  bar   GP  Milana Kidney 12/29/2011, 10:33 AM

## 2011-12-29 NOTE — Progress Notes (Signed)
Subjective: Doing well.  Pain controlled.  Did great with PT and wants to go home.     Objective: Vital signs in last 24 hours: Temp:  [98.2 F (36.8 C)-99.9 F (37.7 C)] 98.7 F (37.1 C) (10/04 0609) Pulse Rate:  [83-106] 83  (10/04 0609) Resp:  [16-18] 16  (10/04 0609) BP: (111-127)/(72-78) 111/74 mmHg (10/04 0609) SpO2:  [95 %-97 %] 97 % (10/04 0609)  Intake/Output from previous day: 10/03 0701 - 10/04 0700 In: 390 [P.O.:390] Out: 800 [Urine:600; Drains:200] Intake/Output this shift:     Basename 12/29/11 0630 12/28/11 0610  HGB 11.5* 12.5*    Basename 12/29/11 0630 12/28/11 0610  WBC 12.1* 10.3  RBC 3.72* 3.94*  HCT 34.1* 35.7*  PLT 188 194    Basename 12/29/11 0630 12/28/11 0610  NA 130* 130*  K 4.2 3.6  CL 90* 89*  CO2 34* 33*  BUN 11 10  CREATININE 1.00 0.97  GLUCOSE 132* 128*  CALCIUM 9.0 9.1    Basename 12/29/11 0630 12/28/11 0610  LABPT -- --  INR 1.57* 1.31    Exam:  Wound looks good.  Staples intact.  No signs of infection.  Calf nt, nvi.  Drain removed.  Assessment/Plan: D/c home today.  Will stop coumadin and only use lovenox injections for postop dvt prophylaxis.  F/u 2 weeks postop.    Richard Levine M 12/29/2011, 12:58 PM

## 2011-12-29 NOTE — Progress Notes (Signed)
I agree with the following treatment note after reviewing documentation.   [name], Deniyah Dillavou Brynn   OTR/L Pager: [phone] Office: [phone] .   

## 2011-12-29 NOTE — Progress Notes (Signed)
Occupational Therapy Treatment Patient Details Name: Richard Levine MRN: [id number] DOB: August 26, 1950 Today's Date: 12/29/2011 Time: 1209-1223 OT Time Calculation (min): 14 min  OT Assessment / Plan / Recommendation Comments on Treatment Session Pt. and wife educated on tub transfer and pt able to complete transfer with supervision and verbal cues for sequencing and safest hand placement.     Follow Up Recommendations  Home health OT    Barriers to Discharge       Equipment Recommendations  None recommended by OT    Recommendations for Other Services    Frequency Min 2X/week   Plan All goals met and education completed, patient discharged from OT services    Precautions / Restrictions Precautions Precautions: Knee Required Braces or Orthoses: Knee Immobilizer - Left Knee Immobilizer - Left: On at all times Restrictions Weight Bearing Restrictions: Yes LLE Weight Bearing: Weight bearing as tolerated   Pertinent Vitals/Pain None reported    ADL  Tub/Shower Transfer: Performed;Supervision/safety Tub/Shower Transfer Method: Anterior-posterior Tub/Shower Transfer Equipment: Counsellor Used: Gait belt;Rolling walker Transfers/Ambulation Related to ADLs: Pt. supervision for transfers with verbal cues for safest hand placement during tub transfer ADL Comments: Pt. and spouse educated on tub transfer. Pt. completed tub transfer using tub bench with supervision for safety. Pt. tends to use RW to hold onto when transfering out of the tub, verbal cueing to push up from the chair.      OT Diagnosis:    OT Problem List:   OT Treatment Interventions:     OT Goals Acute Rehab OT Goals OT Goal Formulation: With patient Time For Goal Achievement: 01/11/12 Potential to Achieve Goals: Good ADL Goals Pt Will Perform Tub/Shower Transfer: Tub transfer;with min assist;Transfer tub bench ADL Goal: Tub/Shower Transfer - Progress: Met  Visit Information  Last OT Received On:  12/29/11 Assistance Needed: +1    Subjective Data      Prior Functioning       Cognition  Overall Cognitive Status: Appears within functional limits for tasks assessed/performed Arousal/Alertness: Awake/alert Orientation Level: Appears intact for tasks assessed Behavior During Session: [facility] for tasks performed    Mobility   Bed Mobility Bed Mobility: Supine to Sit;Sitting - Scoot to Edge of Bed Supine to Sit: 5: Supervision;HOB flat Sitting - Scoot to Edge of Bed: 5: Supervision Details for Bed Mobility Assistance: Pt able to perform bed mobility with no handrails and bed flat with KI on. No assistance given, although cues for proper sequencing and ease Transfers Transfers: Sit to Stand;Stand to Sit Sit to Stand: 5: Supervision;With upper extremity assist;With armrests;From chair/3-in-1 Stand to Sit: 5: Supervision;With upper extremity assist;To chair/3-in-1 Details for Transfer Assistance: Pt still with increased difficulty to achieve anterior translation into standing, although he is able to complete without assitance. Cueing for hand placement and sequencing.        Exercises  Total Joint Exercises Heel Slides: AAROM;Strengthening;Left;10 reps;Seated Long Arc Quad: AROM;Strengthening;Left;10 reps;Seated        End of Session OT - End of Session Equipment Utilized During Treatment: Gait belt Activity Tolerance: Patient tolerated treatment well Patient left: in chair;with call bell/phone within reach;with family/visitor present Nurse Communication: Mobility status  GO     [name] 12/29/2011, 12:58 PM

## 2011-12-30 LAB — PROTIME-INR
INR: 1.9 — AB (ref 0.9–1.1)
Protime: 18.5 seconds

## 2011-12-30 MED ORDER — MORPHINE SULFATE 2 MG/ML IJ SOLN
INTRAMUSCULAR | Status: AC
  Filled 2011-12-30: qty 1

## 2012-01-01 ENCOUNTER — Telehealth: Payer: Self-pay | Admitting: *Deleted

## 2012-01-01 MED ORDER — WARFARIN SODIUM 1 MG PO TABS: 1.0000 mg | ORAL_TABLET | ORAL | Status: DC

## 2012-01-01 NOTE — Telephone Encounter (Signed)
OK. Hold coumadin x 1 day.  Take 1 mg coumadin on tues, then check PT/INR on Wednesday . If he doesn't have 1mg  coumadin pills then please call in warfarin (or coumadin brand name, whichever he already has been on) 1mg  tabs, #15, no RF.-thx

## 2012-01-01 NOTE — Telephone Encounter (Signed)
Wife notified.  She states they are at Dr. Greig Right office now as pt is having pain in his calf.  1 mg tabs sent to pharm.  They will recheck PT/INR on Wednesday.

## 2012-01-01 NOTE — Telephone Encounter (Signed)
VM from Rosalita Chessman, she has been checking patients INR through [facility] Saturday and today.   Saturday PT=18.5, INR= 1.9  Monday PT=19.1, INR= 1.9 Pt had 5mg  coumadin on Saturday and Sunday. Please advise coumadin dose and next check.  Thanks.

## 2012-01-03 ENCOUNTER — Telehealth: Payer: Self-pay | Admitting: *Deleted

## 2012-01-03 LAB — PROTIME-INR: Protime: 15.1 seconds

## 2012-01-03 NOTE — Telephone Encounter (Signed)
Wife notified by Dr. Milinda Cave.

## 2012-01-03 NOTE — Telephone Encounter (Signed)
Take 2 mg coumadin per day and recheck PT/INR in 5d.-thx

## 2012-01-03 NOTE — Telephone Encounter (Signed)
Home Health PT/INR today. PT= 15.1 INR=1.5 Please advise dosing instructions.

## 2012-01-05 ENCOUNTER — Telehealth: Payer: Self-pay

## 2012-01-05 NOTE — Telephone Encounter (Signed)
Pt went for knee surgery last Wed. Oxy 5 mg was wrote for 1-2 every 4-6 hrs prn. Dr Verdis Frederickson RX is for Oxy 5 mg 1-2 q 6hrs prn. Pt is almost out of Oxy? Pts wife would like to know if pt needs to go back to ortho for refill or family doctor? Please advise or refill? Pts wife Rosalita Chessman # [phone])

## 2012-01-05 NOTE — Telephone Encounter (Signed)
Usually goes back to surgeon this early, if they have trouble they should call back quickly

## 2012-01-05 NOTE — Telephone Encounter (Signed)
pts wife informed and stated he was just "honoring" the contract.

## 2012-01-08 ENCOUNTER — Telehealth: Payer: Self-pay | Admitting: Family Medicine

## 2012-01-08 NOTE — Telephone Encounter (Signed)
Left a message for Rosalita Chessman to return my call

## 2012-01-08 NOTE — Telephone Encounter (Signed)
pts wife informed to stay on 5 mg today and 2.5 tomorrow. Pts wife informed that we will have Dr Milinda Cave review this per Dr Abner Greenspan

## 2012-01-08 NOTE — Telephone Encounter (Signed)
So have them continue on 5 mg today and then back down to 2.5 mg daily for now and then we will route this to his PMD to see if they want to make further adjustments.

## 2012-01-08 NOTE — Telephone Encounter (Signed)
Please advise how much coumadin pt should continue to take? If its 2.5 mg pt has enough coumadin, if he needs to take less than 2.5 mg call some into pharmacy.

## 2012-01-09 NOTE — Telephone Encounter (Signed)
Pls call in more 5mg  tabs for him. Continue 2.5mg  once daily and recheck PT/INR in 3d (this Thursday).--thx

## 2012-01-09 NOTE — Telephone Encounter (Signed)
Richard Levine notified.  She is agreeable.  She has enough 5 mg tabs.  She will need 1 mg tabs if he needs 2mg  dosing.

## 2012-01-09 NOTE — Discharge Summary (Signed)
  ABBREVIATED DISCHARGE SUMMARY      DATE OF HOSPITALIZATION:  27 Dec 2011  REASON FOR HOSPITALIZATION:   61 yo wm with history end stage djd left knee and chronic pain.  Failed conservative treatment.      SIGNIFICANT FINDINGS:  DJD  OPERATION:  Left total knee replacement  FINAL DIAGNOSIS:  same  SECONDARY DIAGNOSIS: none  CONSULTANTS:  none  DISCHARGE CONDITION:  STABLE  DISCHARGED TO:  HOME

## 2012-01-12 ENCOUNTER — Telehealth: Payer: Self-pay | Admitting: Gastroenterology

## 2012-01-12 ENCOUNTER — Other Ambulatory Visit: Payer: Self-pay | Admitting: *Deleted

## 2012-01-12 ENCOUNTER — Ambulatory Visit: Payer: Medicare Other | Admitting: Gastroenterology

## 2012-01-12 MED ORDER — MORPHINE SULFATE ER 30 MG PO TBCR: 30.0000 mg | EXTENDED_RELEASE_TABLET | Freq: Three times a day (TID) | ORAL | Status: DC

## 2012-01-12 NOTE — Telephone Encounter (Signed)
Pt request refill on MS Contin.  Pt is 7 days early.  Pt had filled at pharmacy on 12/20/11 and was inpatient 3 days.  States he has 1-2 pills left.  Pt and wife states he was taking extra when he had pancreatitis because he was vomiting and not getting med into system.   Dr. Eulah Pont is filling Oxycodone 5 mg IR.  He would also like for patient to begin decreasing to 2 MS Contin 30 mg tabs daily instead of 3.  Pt is unsure if he can do this while going through PT.    Pt also wants to know if while he is going through recovery and PT if he could take 2-3 Clonazepam daily instead of max of 2.    Coumadin was d/c'd by Dr. Eulah Pont today.  Pain contract reviewed with patient and wife in office and copy given that we need at least 2 days notice to fill prescriptions.

## 2012-01-12 NOTE — Telephone Encounter (Signed)
Referral has been sent to [facility] Abrahams with Dr. [name] and she will contact the patient with date and time

## 2012-01-12 NOTE — Telephone Encounter (Signed)
Pt and wife notified of below.  They are agreeable.  They will call back to schedule 3 week appt when he knows his PT schedule.

## 2012-01-12 NOTE — Telephone Encounter (Signed)
Pt was a no show

## 2012-01-12 NOTE — Telephone Encounter (Signed)
This is an early RF--rx printed.  He apparently threw many of these up when he had pancreatitis recently. We reminded him of the pain contract we have and he was warned today about asking for early RFs---needs to call if requiring more pain pills than what is rx'd, needs to request RF 48h before running out, etc.  Pt needs to make appt to see me in office to discuss pain mgmt in 3 wks. Will not attempt ween of MS contin at this time since his current dosing is barely adequate and he's requiring 8 breakthrough oxycodone 5mg  per day, plus he has PT coming up that will be more intense (for his recent TKR). He was given a rx for oxycodone 5mg  1 week ago by his orthopedist, so I did not rx any of this med for him today.

## 2012-01-12 NOTE — Telephone Encounter (Signed)
Patient was supposed to get scheduled for EUS for idiopathic pancreatitis. Was hospitalized mid-September with first episode. He had knee replacement on 12/27/11.   See if we can get him arranged for EUS with Dr. [name] towards the first of November.   Need to schedule OV with RMR only within next 4 weeks.

## 2012-01-12 NOTE — Telephone Encounter (Signed)
Routed to Hillis Range for scheduling.

## 2012-01-16 ENCOUNTER — Telehealth: Payer: Self-pay

## 2012-01-16 DIAGNOSIS — K861 Other chronic pancreatitis: Secondary | ICD-10-CM

## 2012-01-16 NOTE — Telephone Encounter (Signed)
Message copied by [name] on Tue Jan 16, 2012 10:34 AM ------      Message from: [name]      Created: Tue Jan 16, 2012 10:31 AM      Regarding: RE: EUS Referra;       He needs upper EUS, radial, linear.  For recent pancreatitis. He is on chronic coumadin.  HIs referring MD will have to discuss stoppping that with the patient for 5 days prior to the EUS.  Also he is on chronic narcoticvs.  Needs to be done with MAC at [facility]. Lastly, who is the referring MD (I cannot tell if he is followed by [facility] or [facility], do not see who requested the EUS)            Dan            ----- Message -----         From: [name], CMA         Sent: 01/12/2012  12:47 PM           To: [name], MD      Subject: [name]: EUS Referra;                                                     ----- Message -----         From: [name]         Sent: 01/12/2012  11:46 AM           To: [name], CMA      Subject: EUS Referra;                                             Patient needs EUS with Dr. [name] for Idopathic Pancreatitis       Thanks, [name]

## 2012-01-17 NOTE — Telephone Encounter (Signed)
Richard Levine it is Dr. [name] Referring and Dr. Nicoletta Ba manages his coumadin

## 2012-01-17 NOTE — Telephone Encounter (Signed)
Dr [name] I believe is the referring I will call and find out who manages coumadin.

## 2012-01-18 ENCOUNTER — Other Ambulatory Visit: Payer: Self-pay

## 2012-01-18 DIAGNOSIS — K861 Other chronic pancreatitis: Secondary | ICD-10-CM

## 2012-01-18 NOTE — Telephone Encounter (Signed)
Pt has been instructed and meds reviewed pt will call with any questions or concerns after receiving the instructions in the mail

## 2012-01-25 ENCOUNTER — Ambulatory Visit: Payer: Medicare Other | Admitting: Family Medicine

## 2012-01-26 ENCOUNTER — Encounter: Payer: Self-pay | Admitting: Family Medicine

## 2012-01-26 ENCOUNTER — Ambulatory Visit (INDEPENDENT_AMBULATORY_CARE_PROVIDER_SITE_OTHER): Payer: Medicare Other | Admitting: Family Medicine

## 2012-01-26 ENCOUNTER — Telehealth: Payer: Self-pay

## 2012-01-26 VITALS — BP 114/80 | HR 90 | Ht 71.0 in | Wt 236.0 lb

## 2012-01-26 DIAGNOSIS — I10 Essential (primary) hypertension: Secondary | ICD-10-CM

## 2012-01-26 DIAGNOSIS — M170 Bilateral primary osteoarthritis of knee: Secondary | ICD-10-CM

## 2012-01-26 DIAGNOSIS — IMO0002 Reserved for concepts with insufficient information to code with codable children: Secondary | ICD-10-CM

## 2012-01-26 DIAGNOSIS — E871 Hypo-osmolality and hyponatremia: Secondary | ICD-10-CM

## 2012-01-26 DIAGNOSIS — M1710 Unilateral primary osteoarthritis, unspecified knee: Secondary | ICD-10-CM

## 2012-01-26 LAB — BASIC METABOLIC PANEL
Calcium: 9.5 mg/dL (ref 8.4–10.5)
Chloride: 95 mEq/L — ABNORMAL LOW (ref 96–112)
Creatinine, Ser: 1 mg/dL (ref 0.4–1.5)
GFR: 82.16 mL/min (ref >60.00)

## 2012-01-26 MED ORDER — CLONAZEPAM 1 MG PO TABS: 1.0000 mg | ORAL_TABLET | Freq: Two times a day (BID) | ORAL | Status: DC | PRN

## 2012-01-26 MED ORDER — OXYCODONE HCL 10 MG PO TABS: ORAL_TABLET | ORAL | Status: DC

## 2012-01-26 MED ORDER — HYDROCHLOROTHIAZIDE 25 MG PO TABS: 25.0000 mg | ORAL_TABLET | Freq: Every day | ORAL | Status: DC

## 2012-01-26 MED ORDER — FLUTICASONE PROPIONATE 0.05 % EX CREA: TOPICAL_CREAM | Freq: Two times a day (BID) | CUTANEOUS | Status: DC

## 2012-01-26 NOTE — Telephone Encounter (Signed)
Call placed to Dr Milinda Cave [phone] to inquire about coumadin hold. Per Francee Piccolo CMA pt is no longer on coumadin and she will fax this in writing.

## 2012-01-26 NOTE — Assessment & Plan Note (Signed)
Chronic pain syndrome requiring chronic narcotic pain meds to remain functional. Doing well 1 mo s/p left TKR. He will be getting right TKR in the near future. Will continue MS contin and for a brief period I recommended he try oxycodone 10mg  tabs for breakthrough pain instead of the percocet 10/325 tabs.  If headaches resolve with stopping the pill with acetaminophen in it then I'll print new rx for the remainder of his usual 1 mo supply of oxycodone 10mg .  If HA's don't resolve, then get back on the percocet 10/325.

## 2012-01-26 NOTE — Assessment & Plan Note (Signed)
Problem stable.  Continue current medications and diet appropriate for this condition.  We have reviewed our general long term plan for this problem and also reviewed symptoms and signs that should prompt the patient to call or return to the office. Check lytes/cr.  Restart Aspirin 81mg  qd.
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
## 2012-01-26 NOTE — Telephone Encounter (Signed)
Message copied by [name] on Fri Jan 26, 2012  8:25 AM ------      Message from: [name]      Created: Thu Jan 25, 2012  8:45 AM                   ----- Message -----         From: [name], CMA         Sent: 01/25/2012           To: [name], CMA            Waiting on anti coag response from Dr Milinda Cave

## 2012-01-29 ENCOUNTER — Other Ambulatory Visit (HOSPITAL_COMMUNITY): Payer: Medicare Other

## 2012-01-31 ENCOUNTER — Other Ambulatory Visit: Payer: Self-pay | Admitting: Family Medicine

## 2012-01-31 ENCOUNTER — Telehealth: Payer: Self-pay

## 2012-01-31 ENCOUNTER — Telehealth: Payer: Self-pay | Admitting: Family Medicine

## 2012-01-31 MED ORDER — OXYCODONE HCL 10 MG PO TABS: ORAL_TABLET | ORAL | Status: DC

## 2012-01-31 NOTE — Telephone Encounter (Signed)
rx for #70 of the oxycodone 10mg  tabs printed.

## 2012-01-31 NOTE — Telephone Encounter (Signed)
Left message for pt to return call he called [facility] and told them he wanted to cx his procedure until Jan,  I need to confirm this with the pt

## 2012-01-31 NOTE — Telephone Encounter (Signed)
Calling to see if the pt is cx his procedure for 02/08/12

## 2012-01-31 NOTE — Telephone Encounter (Signed)
Per 11/1 note, pt will need written for remainder of refill.

## 2012-01-31 NOTE — Telephone Encounter (Signed)
Patient is calling back to give an update on the pain medication he was given last week, his headaches have stopped with the new pain medication

## 2012-02-01 ENCOUNTER — Telehealth: Payer: Self-pay | Admitting: Gastroenterology

## 2012-02-01 NOTE — Telephone Encounter (Signed)
Pt notified RX ready.  He will come pick up.

## 2012-02-01 NOTE — Telephone Encounter (Signed)
See alternate phone note  

## 2012-02-01 NOTE — Telephone Encounter (Signed)
Pt would like cx the EUS and wait until the first of the year.  He has had knee surgery and is scheduled for a second surgery.  [facility] endo notified and cx.  The pt will call to reschedule

## 2012-02-01 NOTE — Telephone Encounter (Signed)
OK, please let referring MD know.  Thanks

## 2012-02-01 NOTE — Telephone Encounter (Signed)
Pt referring aware

## 2012-02-07 ENCOUNTER — Inpatient Hospital Stay: Admit: 2012-02-07 | Payer: Self-pay | Admitting: Orthopedic Surgery

## 2012-02-07 SURGERY — ARTHROPLASTY, KNEE, TOTAL
Anesthesia: General | Laterality: Right

## 2012-02-08 ENCOUNTER — Encounter (HOSPITAL_COMMUNITY): Admission: RE | Payer: Self-pay | Source: Ambulatory Visit

## 2012-02-08 ENCOUNTER — Ambulatory Visit (HOSPITAL_COMMUNITY): Admission: RE | Admit: 2012-02-08 | Payer: Medicare Other | Source: Ambulatory Visit | Admitting: Gastroenterology

## 2012-02-08 SURGERY — UPPER ENDOSCOPIC ULTRASOUND (EUS) LINEAR
Anesthesia: Monitor Anesthesia Care

## 2012-02-12 ENCOUNTER — Other Ambulatory Visit: Payer: Self-pay | Admitting: Family Medicine

## 2012-02-12 MED ORDER — MORPHINE SULFATE ER 30 MG PO TBCR
30.0000 mg | EXTENDED_RELEASE_TABLET | Freq: Three times a day (TID) | ORAL | Status: DC
Start: 2012-02-12 — End: 2012-03-12

## 2012-02-12 NOTE — Telephone Encounter (Signed)
Refill request for MS CONTIN Last filled- 01/12/12 Last seen- 01/26/12 Follow up - 1 MONTH  RX printed to be signed by MD.

## 2012-02-13 NOTE — Telephone Encounter (Signed)
Pt notified RX ready to be picked up 

## 2012-02-20 ENCOUNTER — Other Ambulatory Visit: Payer: Self-pay | Admitting: *Deleted

## 2012-02-20 NOTE — Telephone Encounter (Signed)
Refill request for OXYCODONE Last filled- 01/26/12, restarted with #20, pt received remaining #70 on 01/31/12 Last seen-01/26/12 Follow up - none scheduled Pt should run out over weekend.

## 2012-02-21 ENCOUNTER — Encounter: Payer: Self-pay | Admitting: Internal Medicine

## 2012-02-21 MED ORDER — OXYCODONE HCL 10 MG PO TABS: ORAL_TABLET | ORAL | Status: DC

## 2012-02-21 NOTE — Telephone Encounter (Signed)
Rx printed. Needs next office f/u in 1-2 months for pain med recheck/follow up.-thx

## 2012-02-21 NOTE — Telephone Encounter (Signed)
Message left for pt to return call

## 2012-02-21 NOTE — Telephone Encounter (Signed)
Notified ready to pick up 

## 2012-02-21 NOTE — Telephone Encounter (Signed)
Pt is aware of OV on 04/09/12 at 4 with RMR and appt card was mailed

## 2012-03-12 ENCOUNTER — Other Ambulatory Visit: Payer: Self-pay | Admitting: *Deleted

## 2012-03-12 MED ORDER — MORPHINE SULFATE ER 30 MG PO TBCR: 30.0000 mg | EXTENDED_RELEASE_TABLET | Freq: Three times a day (TID) | ORAL | Status: DC

## 2012-03-12 NOTE — Telephone Encounter (Signed)
Refill request for MS CONTIN Last filled-02/12/12 Last seen- 01/26/12 Follow up - advised in 11/27 refill note he needs appt in 1-2 months. Please advise refills.

## 2012-03-13 NOTE — Telephone Encounter (Signed)
Pt notified RX at front desk.

## 2012-03-14 ENCOUNTER — Other Ambulatory Visit: Payer: Self-pay | Admitting: Family Medicine

## 2012-03-28 ENCOUNTER — Ambulatory Visit (INDEPENDENT_AMBULATORY_CARE_PROVIDER_SITE_OTHER): Payer: Medicare Other | Admitting: Family Medicine

## 2012-03-28 ENCOUNTER — Encounter: Payer: Self-pay | Admitting: Family Medicine

## 2012-03-28 VITALS — BP 107/78 | HR 76 | Temp 98.0°F | Ht 71.0 in | Wt 237.0 lb

## 2012-03-28 DIAGNOSIS — K5909 Other constipation: Secondary | ICD-10-CM

## 2012-03-28 DIAGNOSIS — IMO0002 Reserved for concepts with insufficient information to code with codable children: Secondary | ICD-10-CM

## 2012-03-28 DIAGNOSIS — K5900 Constipation, unspecified: Secondary | ICD-10-CM

## 2012-03-28 DIAGNOSIS — M1710 Unilateral primary osteoarthritis, unspecified knee: Secondary | ICD-10-CM

## 2012-03-28 DIAGNOSIS — M170 Bilateral primary osteoarthritis of knee: Secondary | ICD-10-CM

## 2012-03-28 DIAGNOSIS — E8881 Metabolic syndrome: Secondary | ICD-10-CM

## 2012-03-28 DIAGNOSIS — R42 Dizziness and giddiness: Secondary | ICD-10-CM

## 2012-03-28 MED ORDER — MORPHINE SULFATE ER 30 MG PO TBCR: 30.0000 mg | EXTENDED_RELEASE_TABLET | Freq: Three times a day (TID) | ORAL | Status: DC

## 2012-03-28 MED ORDER — OXYCODONE HCL 10 MG PO TABS: ORAL_TABLET | ORAL | Status: DC
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
## 2012-03-28 NOTE — Patient Instructions (Signed)
Stop your amlodipine (norvasc).  Continue to monitor bp once daily. Continue senakot-S 2 tabs every night. Add metamucil every night (OTC, any form) --full adult dosing every night. After 1 week of nightly metamucil, add one capful of miralax once every morning.

## 2012-03-30 ENCOUNTER — Encounter: Payer: Self-pay | Admitting: Family Medicine

## 2012-03-30 DIAGNOSIS — R42 Dizziness and giddiness: Secondary | ICD-10-CM | POA: Insufficient documentation

## 2012-03-30 DIAGNOSIS — K5909 Other constipation: Secondary | ICD-10-CM | POA: Insufficient documentation

## 2012-03-30 NOTE — Assessment & Plan Note (Signed)
He is 3 mo s/p left TKR and doing ok.  Still requiring chronic narcotic pain meds for adequate relief of remaining pain in left knee plus the ongoing severe pain in the right knee.  He will be meeting with his orthopedist soon to discuss the plan for the TKR on the right. I did RF both of his pain meds today.  He was due for his oxycodone, but the morphine rx is early (but he is not out of this med) so it has an appropriate fill on/after date on it.

## 2012-03-30 NOTE — Assessment & Plan Note (Signed)
Currently his HTN is a bit overmedicated. We'll d/c norvasc and continue HCTZ, Toprol XL, and lisinopril at current dosing. Encouraged hydration, take extra care with postural changes to go slow and let any lightheadedness resolve before moving.

## 2012-03-30 NOTE — Assessment & Plan Note (Addendum)
Narcotics+inactivity. Encouraged him to push himself to walk more each day. Continue senakot S 2 tabs qhs, add metamucil full dose qd, and in 1 wk add miralax 1 capful daily. Of note, he has GI f/u with Dr. Kendell Bane in about 10d.  I'll see him back in 1 mo.

## 2012-04-08 ENCOUNTER — Other Ambulatory Visit: Payer: Self-pay | Admitting: Family Medicine

## 2012-04-08 NOTE — Telephone Encounter (Signed)
eScribe request for refill on  Last seen on - 01/26/12 RX sent per protocol.

## 2012-04-09 ENCOUNTER — Ambulatory Visit: Payer: Medicare Other | Admitting: Internal Medicine

## 2012-04-29 ENCOUNTER — Encounter: Payer: Self-pay | Admitting: Family Medicine

## 2012-04-29 ENCOUNTER — Ambulatory Visit (INDEPENDENT_AMBULATORY_CARE_PROVIDER_SITE_OTHER): Payer: Medicare Other | Admitting: Family Medicine

## 2012-04-29 VITALS — BP 121/75 | HR 88 | Ht 71.0 in | Wt 239.0 lb

## 2012-04-29 DIAGNOSIS — R42 Dizziness and giddiness: Secondary | ICD-10-CM

## 2012-04-29 DIAGNOSIS — K5900 Constipation, unspecified: Secondary | ICD-10-CM

## 2012-04-29 DIAGNOSIS — K5909 Other constipation: Secondary | ICD-10-CM

## 2012-04-29 DIAGNOSIS — G894 Chronic pain syndrome: Secondary | ICD-10-CM

## 2012-04-29 MED ORDER — MORPHINE SULFATE ER 30 MG PO TBCR: 30.0000 mg | EXTENDED_RELEASE_TABLET | Freq: Three times a day (TID) | ORAL | Status: DC

## 2012-04-29 MED ORDER — OXYCODONE HCL 10 MG PO TABS: ORAL_TABLET | ORAL | Status: DC
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
## 2012-04-30 DIAGNOSIS — G894 Chronic pain syndrome: Secondary | ICD-10-CM | POA: Insufficient documentation

## 2012-04-30 NOTE — Assessment & Plan Note (Signed)
Osteoarthritic pain from LB and knees (R>L). Continue current pain med regimen, rx's given today for this month and for next month (with appropriate fill on/after dates on rx's). Check UDS today.

## 2012-04-30 NOTE — Assessment & Plan Note (Addendum)
Resolved with d/c of norvasc last visit. Continue lisin, hctz, metop. Lytes/cr fine 01/2012.

## 2012-04-30 NOTE — Assessment & Plan Note (Signed)
Improved. Continue current bowel regimen.

## 2012-05-01 LAB — PRESCRIPTION ABUSE MONITORING 10P, URINE
Amphetamine/Meth: NEGATIVE ng/mL
Barbiturate Screen, Urine: NEGATIVE ng/mL
Creatinine, Urine: 259.47 mg/dL (ref >20.0)
Oxycodone Screen, Ur: NEGATIVE ng/mL
Propoxyphene: NEGATIVE ng/mL

## 2012-05-02 LAB — BENZODIAZEPINES (GC/LC/MS), URINE
Alprazolam (GC/LC/MS), ur confirm: NEGATIVE ng/mL
Alprazolam metabolite (GC/LC/MS), ur confirm: NEGATIVE ng/mL
Clonazepam metabolite (GC/LC/MS), ur confirm: 1989 ng/mL — ABNORMAL HIGH
Flunitrazepam metabolite (GC/LC/MS), ur confirm: NEGATIVE ng/mL
Flurazepam metabolite (GC/LC/MS), ur confirm: NEGATIVE ng/mL
Lorazepam (GC/LC/MS), ur confirm: NEGATIVE ng/mL

## 2012-05-02 LAB — OPIATES/OPIOIDS (LC/MS-MS)
Heroin (6-AM), UR: NEGATIVE ng/mL
Noroxycodone, Ur: NEGATIVE ng/mL
Oxycodone, ur: NEGATIVE ng/mL
Oxymorphone: NEGATIVE ng/mL

## 2012-05-21 ENCOUNTER — Encounter: Payer: Self-pay | Admitting: Internal Medicine

## 2012-05-21 ENCOUNTER — Telehealth: Payer: Self-pay

## 2012-05-21 ENCOUNTER — Ambulatory Visit (INDEPENDENT_AMBULATORY_CARE_PROVIDER_SITE_OTHER): Payer: Medicare Other | Admitting: Internal Medicine

## 2012-05-21 VITALS — BP 120/79 | HR 81 | Temp 97.8°F | Ht 70.0 in | Wt 243.4 lb

## 2012-05-21 DIAGNOSIS — Z8719 Personal history of other diseases of the digestive system: Secondary | ICD-10-CM

## 2012-05-21 DIAGNOSIS — K859 Acute pancreatitis without necrosis or infection, unspecified: Secondary | ICD-10-CM
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
## 2012-05-21 NOTE — Patient Instructions (Addendum)
Endoscopic ultrasound - Dr. [name] - pancreatitis - will need propofol  Referral has been sent to [facility] with Dr. [name] and Alexia Freestone will contact Mr. Stary and schedule date & time  Stop Carafate  Continue Protonix 40 mg daily  Further recommendations to follow

## 2012-05-21 NOTE — Telephone Encounter (Signed)
Message copied by [name] on Tue May 21, 2012  3:19 PM ------      Message from: [name]      Created: Tue May 21, 2012 11:41 AM      Regarding: RE: Endoscopic Ultrasound       Please schedule upper EUS, radial +/- linear, next available EUS Thursday , ++ MAC sedation, for recent pancreatitis.                    Thanks                  ----- Message -----         From: [name], CMA         Sent: 05/21/2012  10:53 AM           To: [name], MD      Subject: [name]: Endoscopic Ultrasound                                            ----- Message -----         From: [name]         Sent: 05/21/2012  10:39 AM           To: [name], CMA      Subject: Endoscopic Ultrasound                                    Richard Levine, Dr. [name] is referring Richard Levine for a Endoscopic U/S with Dr. [name] for pancreatitis and patient will need Propofol per Dr. [name]. Thanks [name]       ------

## 2012-05-22 ENCOUNTER — Other Ambulatory Visit: Payer: Self-pay

## 2012-05-22 ENCOUNTER — Encounter: Payer: Self-pay | Admitting: Gastroenterology

## 2012-05-22 DIAGNOSIS — K859 Acute pancreatitis without necrosis or infection, unspecified: Secondary | ICD-10-CM

## 2012-05-22 NOTE — Telephone Encounter (Signed)
Pt appt time has been changed to 1230 pm and needs to arrive at 11 am pt needs to be aware to make the change on instructions

## 2012-05-22 NOTE — Telephone Encounter (Signed)
Left message on machine to call back  

## 2012-05-23 NOTE — Telephone Encounter (Signed)
Left message on machine to call back  

## 2012-05-23 NOTE — Telephone Encounter (Signed)
Pt has been instructed and meds reviewed he will call with any questions after reviewing info.  He is aware of the time change and will make adjustments on his paperwork

## 2012-06-05 ENCOUNTER — Encounter (HOSPITAL_COMMUNITY): Payer: Self-pay | Admitting: *Deleted

## 2012-06-06 ENCOUNTER — Encounter (HOSPITAL_COMMUNITY): Payer: Self-pay | Admitting: *Deleted

## 2012-06-13 ENCOUNTER — Encounter (HOSPITAL_COMMUNITY)
Admission: RE | Disposition: A | Discharge status: Home / Self Care | Payer: Self-pay | Source: Ambulatory Visit | Attending: Gastroenterology

## 2012-06-13 ENCOUNTER — Encounter (HOSPITAL_COMMUNITY): Payer: Self-pay | Admitting: *Deleted

## 2012-06-13 ENCOUNTER — Ambulatory Visit (HOSPITAL_COMMUNITY)
Admission: RE | Admit: 2012-06-13 | Discharge: 2012-06-13 | Disposition: A | Discharge status: Home / Self Care | Payer: Medicare Other | Source: Ambulatory Visit | Attending: Gastroenterology | Admitting: Gastroenterology

## 2012-06-13 ENCOUNTER — Ambulatory Visit (HOSPITAL_COMMUNITY): Payer: Medicare Other | Admitting: Anesthesiology

## 2012-06-13 ENCOUNTER — Encounter (HOSPITAL_COMMUNITY): Payer: Self-pay | Admitting: Anesthesiology

## 2012-06-13 DIAGNOSIS — I10 Essential (primary) hypertension: Secondary | ICD-10-CM | POA: Insufficient documentation

## 2012-06-13 DIAGNOSIS — Z79899 Other long term (current) drug therapy: Secondary | ICD-10-CM | POA: Insufficient documentation

## 2012-06-13 DIAGNOSIS — E782 Mixed hyperlipidemia: Secondary | ICD-10-CM | POA: Insufficient documentation

## 2012-06-13 DIAGNOSIS — K859 Acute pancreatitis without necrosis or infection, unspecified: Secondary | ICD-10-CM

## 2012-06-13 DIAGNOSIS — K219 Gastro-esophageal reflux disease without esophagitis: Secondary | ICD-10-CM | POA: Insufficient documentation

## 2012-06-13 HISTORY — PX: EUS: SHX5427

## 2012-06-13 SURGERY — UPPER ENDOSCOPIC ULTRASOUND (EUS) LINEAR
Anesthesia: Monitor Anesthesia Care

## 2012-06-13 MED ORDER — LACTATED RINGERS IV SOLN
INTRAVENOUS | Status: DC | PRN
  Administered 2012-06-13: 12:00:00 via INTRAVENOUS

## 2012-06-13 MED ORDER — LACTATED RINGERS IV SOLN
INTRAVENOUS | Status: DC
  Administered 2012-06-13: 11:00:00 via INTRAVENOUS

## 2012-06-13 MED ORDER — KETAMINE HCL 10 MG/ML IJ SOLN
INTRAMUSCULAR | Status: DC | PRN
  Administered 2012-06-13: 10 mg via INTRAVENOUS
  Administered 2012-06-13: 30 mg via INTRAVENOUS
  Administered 2012-06-13: 10 mg via INTRAVENOUS

## 2012-06-13 MED ORDER — BUTAMBEN-TETRACAINE-BENZOCAINE 2-2-14 % EX AERO
INHALATION_SPRAY | CUTANEOUS | Status: DC | PRN
  Administered 2012-06-13: 2 via TOPICAL

## 2012-06-13 MED ORDER — PROPOFOL 10 MG/ML IV EMUL
INTRAVENOUS | Status: DC | PRN
  Administered 2012-06-13: 200 ug/kg/min via INTRAVENOUS

## 2012-06-13 MED ORDER — DEXAMETHASONE SODIUM PHOSPHATE 10 MG/ML IJ SOLN
INTRAMUSCULAR | Status: DC | PRN
  Administered 2012-06-13: 10 mg via INTRAVENOUS

## 2012-06-13 MED ORDER — SODIUM CHLORIDE 0.9 % IV SOLN: INTRAVENOUS | Status: DC

## 2012-06-13 NOTE — Transfer of Care (Signed)
Immediate Anesthesia Transfer of Care Note  Patient: Richard Levine  Procedure(s) Performed: Procedure(s): UPPER ENDOSCOPIC ULTRASOUND (EUS) LINEAR (N/A)  Patient Location: PACU  Anesthesia Type:MAC  Level of Consciousness: awake, patient cooperative and responds to stimulation   Airway & Oxygen Therapy: Patient Spontanous Breathing and Patient connected to nasal cannula oxygen  Post-op Assessment: Report given to PACU RN, Post -op Vital signs reviewed and stable and Patient moving all extremities X 4  Post vital signs: Reviewed and stable  Complications: No apparent anesthesia complications

## 2012-06-13 NOTE — Preoperative (Signed)
Beta Blockers   Reason not to administer Beta Blockers:Not Applicable, took BB this am 
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
## 2012-06-13 NOTE — Interval H&P Note (Signed)
History and Physical Interval Note:  06/13/2012 11:04 AM  Richard Levine  has presented today for surgery, with the diagnosis of Pancreatitis [577.0]  The various methods of treatment have been discussed with the patient and family. After consideration of risks, benefits and other options for treatment, the patient has consented to  Procedure(s): UPPER ENDOSCOPIC ULTRASOUND (EUS) LINEAR (N/A) as a surgical intervention .  The patient's history has been reviewed, patient examined, no change in status, stable for surgery.  I have reviewed the patient's chart and labs.  Questions were answered to the patient's satisfaction.     Rob Bunting

## 2012-06-13 NOTE — Anesthesia Postprocedure Evaluation (Signed)
Anesthesia Post Note  Patient: Richard Levine  Procedure(s) Performed: Procedure(s) (LRB): UPPER ENDOSCOPIC ULTRASOUND (EUS) LINEAR (N/A)  Anesthesia type: MAC  Patient location: PACU  Post pain: Pain level controlled  Post assessment: Post-op Vital signs reviewed  Last Vitals: BP 147/99  Pulse 77  Temp(Src) 36.7 C (Oral)  Resp 22  Ht 5\' 10"  (1.778 m)  Wt 235 lb (106.595 kg)  BMI 33.72 kg/m2  SpO2 99%  Post vital signs: Reviewed  Level of consciousness: awake  Complications: No apparent anesthesia complications

## 2012-06-13 NOTE — Op Note (Signed)
[facility] [address] [place], [address]   ENDOSCOPIC ULTRASOUND PROCEDURE REPORT  PATIENT: Richard Levine, Richard Levine  MR#: [id number] BIRTHDATE: [birth year]  GENDER: Male ENDOSCOPIST: [name], MD REFERRED BY:  Roetta Sessions, M.D. PROCEDURE DATE:  06/13/2012 PROCEDURE:   Upper EUS ASA CLASS:      Class II INDICATIONS:   recent mild acute pancreatitis; no gallstones, non-drinker. MEDICATIONS: MAC sedation, administered by CRNA  DESCRIPTION OF PROCEDURE:   After the risks benefits and alternatives of the procedure were  explained, informed consent was obtained. The patient was then placed in the left, lateral, decubitus postion and IV sedation was administered. Throughout the procedure, the patients blood pressure, pulse and oxygen saturations were monitored continuously.  Under direct visualization, the Pentax Radial EUS [id number]  endoscope was introduced through the mouth  and advanced to the second portion of the duodenum .  Water was used as necessary to provide an acoustic interface.  Upon completion of the imaging, water was removed and the patient was sent to the recovery room in satisfactory condition.   Endoscopic findings: 1. Normal UGI tract  EUS findings: 1. Pancreatic parenchyma was normal throughout; no discrete masses and no signs of chronic pancreatitis 2. CBD was normal; non-dilated and no filling defects 3. Pancreatic divisim was ruled out during this examination 4. No peripancreatic adenopathy 5. Limited views of liver, spleen, portal and splenic vessels were all normal  Impression: Normal examination of UGI tract.  Should consider elective cholecstectomy for recent unexplained pancreatitis, however would also be reasonable to observe clinically and only refer for surgery if repeat pancreatitis occurs.   _______________________________ eSignedRachael Fee, MD 06/13/2012 12:31 PM

## 2012-06-13 NOTE — Anesthesia Preprocedure Evaluation (Addendum)
Anesthesia Evaluation  Patient identified by MRN, date of birth, ID band Patient awake    Reviewed: Allergy & Precautions, H&P , NPO status , Patient's Chart, lab work & pertinent test results, reviewed documented beta blocker date and time   Airway Mallampati: II TM Distance: >3 FB Neck ROM: full    Dental  (+) Teeth Intact, Caps and Dental Advisory Given   Pulmonary neg pulmonary ROS,  breath sounds clear to auscultation  Pulmonary exam normal       Cardiovascular hypertension, Pt. on home beta blockers and Pt. on medications Rhythm:Regular Rate:Normal     Neuro/Psych Seizures -, Well Controlled,  PSYCHIATRIC DISORDERS Anxiety Depression    GI/Hepatic Neg liver ROS, GERD-  Controlled,  Endo/Other  obese  Renal/GU CRFRenal disease     Musculoskeletal  (+) Arthritis -, Osteoarthritis,    Abdominal   Peds  Hematology negative hematology ROS (+)   Anesthesia Other Findings Caps front top X 2;  Goatee, mustache  Reproductive/Obstetrics                          Anesthesia Physical  Anesthesia Plan  ASA: III  Anesthesia Plan: MAC   Post-op Pain Management: MAC Combined w/ Regional for Post-op pain   Induction: Intravenous  Airway Management Planned: Nasal Cannula and Simple Face Mask  Additional Equipment:   Intra-op Plan:   Post-operative Plan:   Informed Consent: I have reviewed the patients History and Physical, chart, labs and discussed the procedure including the risks, benefits and alternatives for the proposed anesthesia with the patient or authorized representative who has indicated his/her understanding and acceptance.   Dental advisory given  Plan Discussed with: CRNA  Anesthesia Plan Comments:         Anesthesia Quick Evaluation

## 2012-06-14 ENCOUNTER — Encounter (HOSPITAL_COMMUNITY): Payer: Self-pay | Admitting: Gastroenterology

## 2012-06-17 ENCOUNTER — Other Ambulatory Visit: Payer: Self-pay | Admitting: *Deleted

## 2012-06-17 MED ORDER — METOPROLOL SUCCINATE ER 100 MG PO TB24: 100.0000 mg | ORAL_TABLET | Freq: Every day | ORAL | Status: DC

## 2012-06-17 NOTE — Telephone Encounter (Signed)
Faxed refill request received from pharmacy for METOPROLOL  ER--change strength from 200 mg 1/2 tab daily to 100 mg 1 tab daily Last filled by MD on  Last seen on 04/29/12 Follow up 06/27/12 Is this change OK?

## 2012-06-27 ENCOUNTER — Ambulatory Visit (INDEPENDENT_AMBULATORY_CARE_PROVIDER_SITE_OTHER): Payer: Medicare Other | Admitting: Family Medicine

## 2012-06-27 ENCOUNTER — Encounter: Payer: Self-pay | Admitting: Family Medicine

## 2012-06-27 VITALS — BP 120/80 | HR 72 | Ht 71.0 in | Wt 237.0 lb

## 2012-06-27 DIAGNOSIS — E782 Mixed hyperlipidemia: Secondary | ICD-10-CM

## 2012-06-27 DIAGNOSIS — R252 Cramp and spasm: Secondary | ICD-10-CM

## 2012-06-27 DIAGNOSIS — IMO0002 Reserved for concepts with insufficient information to code with codable children: Secondary | ICD-10-CM

## 2012-06-27 DIAGNOSIS — M170 Bilateral primary osteoarthritis of knee: Secondary | ICD-10-CM

## 2012-06-27 DIAGNOSIS — R7309 Other abnormal glucose: Secondary | ICD-10-CM

## 2012-06-27 DIAGNOSIS — M1710 Unilateral primary osteoarthritis, unspecified knee: Secondary | ICD-10-CM

## 2012-06-27 DIAGNOSIS — G894 Chronic pain syndrome: Secondary | ICD-10-CM

## 2012-06-27 DIAGNOSIS — R739 Hyperglycemia, unspecified: Secondary | ICD-10-CM

## 2012-06-27 DIAGNOSIS — I10 Essential (primary) hypertension: Secondary | ICD-10-CM

## 2012-06-27 DIAGNOSIS — Z8719 Personal history of other diseases of the digestive system: Secondary | ICD-10-CM

## 2012-06-27 DIAGNOSIS — E785 Hyperlipidemia, unspecified: Secondary | ICD-10-CM

## 2012-06-27 LAB — LIPID PANEL
Cholesterol: 220 mg/dL — ABNORMAL HIGH (ref 0–200)
Triglycerides: 178 mg/dL — ABNORMAL HIGH (ref 0.0–149.0)

## 2012-06-27 LAB — COMPREHENSIVE METABOLIC PANEL
Albumin: 4.1 g/dL (ref 3.5–5.2)
BUN: 23 mg/dL (ref 6–23)
CO2: 30 mEq/L (ref 19–32)
Calcium: 9.2 mg/dL (ref 8.4–10.5)
GFR: 70.43 mL/min (ref >60.00)
Glucose, Bld: 113 mg/dL — ABNORMAL HIGH (ref 70–99)
Potassium: 3.3 mEq/L — ABNORMAL LOW (ref 3.5–5.1)
Sodium: 132 mEq/L — ABNORMAL LOW (ref 135–145)
Total Protein: 8.1 g/dL (ref 6.0–8.3)

## 2012-06-27 MED ORDER — OXYCODONE HCL 10 MG PO TABS: ORAL_TABLET | ORAL | Status: DC

## 2012-06-27 MED ORDER — MORPHINE SULFATE ER 30 MG PO TBCR: 30.0000 mg | EXTENDED_RELEASE_TABLET | Freq: Three times a day (TID) | ORAL | Status: DC

## 2012-06-27 MED ORDER — LISINOPRIL 5 MG PO TABS: 5.0000 mg | ORAL_TABLET | Freq: Every day | ORAL | Status: DC

## 2012-06-27 MED ORDER — PANTOPRAZOLE SODIUM 40 MG PO TBEC: 40.0000 mg | DELAYED_RELEASE_TABLET | Freq: Every day | ORAL | Status: DC

## 2012-06-27 MED ORDER — CLONAZEPAM 1 MG PO TABS: 1.0000 mg | ORAL_TABLET | Freq: Two times a day (BID) | ORAL | Status: DC | PRN
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
## 2012-06-28 ENCOUNTER — Encounter: Payer: Self-pay | Admitting: Family Medicine

## 2012-06-28 DIAGNOSIS — E782 Mixed hyperlipidemia: Secondary | ICD-10-CM | POA: Insufficient documentation

## 2012-06-28 DIAGNOSIS — Z8719 Personal history of other diseases of the digestive system: Secondary | ICD-10-CM | POA: Insufficient documentation

## 2012-06-28 LAB — HEMOGLOBIN A1C: Hgb A1c MFr Bld: 5.9 % (ref 4.6–6.5)

## 2012-06-28 LAB — LDL CHOLESTEROL, DIRECT: Direct LDL: 162.1 mg/dL

## 2012-06-28 MED ORDER — ASPIRIN 81 MG PO TABS: 81.0000 mg | ORAL_TABLET | Freq: Every day | ORAL | Status: DC

## 2012-06-28 NOTE — Assessment & Plan Note (Signed)
Low back and knees. Continue morphine and oxycodone as currently rx'd.  I gave rx's for this month and for next month with appropriate fill on/after dates.

## 2012-06-28 NOTE — Assessment & Plan Note (Addendum)
Problem stable.  Continue current medications and diet appropriate for this condition.  We have reviewed our general long term plan for this problem and also reviewed symptoms and signs that should prompt the patient to call or return to the office.  Check BMET, Mg, Phos. Add ASA 81mg  qd for primary CV prevention.

## 2012-06-28 NOTE — Assessment & Plan Note (Addendum)
Not on meds. Check FLP today.

## 2012-06-28 NOTE — Assessment & Plan Note (Signed)
2013. Isolated episode, GB normal/no alcohol.  Very quick return to normal. EUS by GI was normal. If repeat episode then GB needs to come out.

## 2012-08-20 ENCOUNTER — Encounter: Payer: Self-pay | Admitting: General Practice

## 2012-08-26 ENCOUNTER — Ambulatory Visit (INDEPENDENT_AMBULATORY_CARE_PROVIDER_SITE_OTHER): Payer: Medicare Other | Admitting: Family Medicine

## 2012-08-26 ENCOUNTER — Encounter: Payer: Self-pay | Admitting: Family Medicine

## 2012-08-26 VITALS — BP 122/80 | HR 73 | Temp 97.9°F | Resp 16 | Wt 246.5 lb

## 2012-08-26 DIAGNOSIS — G894 Chronic pain syndrome: Secondary | ICD-10-CM

## 2012-08-26 DIAGNOSIS — E785 Hyperlipidemia, unspecified: Secondary | ICD-10-CM

## 2012-08-26 DIAGNOSIS — G8929 Other chronic pain: Secondary | ICD-10-CM

## 2012-08-26 DIAGNOSIS — I10 Essential (primary) hypertension: Secondary | ICD-10-CM

## 2012-08-26 DIAGNOSIS — M545 Low back pain, unspecified: Secondary | ICD-10-CM

## 2012-08-26 DIAGNOSIS — R7309 Other abnormal glucose: Secondary | ICD-10-CM

## 2012-08-26 DIAGNOSIS — R739 Hyperglycemia, unspecified: Secondary | ICD-10-CM

## 2012-08-26 DIAGNOSIS — Z8719 Personal history of other diseases of the digestive system: Secondary | ICD-10-CM

## 2012-08-26 DIAGNOSIS — IMO0002 Reserved for concepts with insufficient information to code with codable children: Secondary | ICD-10-CM

## 2012-08-26 DIAGNOSIS — M1710 Unilateral primary osteoarthritis, unspecified knee: Secondary | ICD-10-CM

## 2012-08-26 DIAGNOSIS — M170 Bilateral primary osteoarthritis of knee: Secondary | ICD-10-CM

## 2012-08-26 DIAGNOSIS — E876 Hypokalemia: Secondary | ICD-10-CM | POA: Insufficient documentation

## 2012-08-26 DIAGNOSIS — R252 Cramp and spasm: Secondary | ICD-10-CM

## 2012-08-26 DIAGNOSIS — E782 Mixed hyperlipidemia: Secondary | ICD-10-CM

## 2012-08-26 LAB — BASIC METABOLIC PANEL
Chloride: 98 mEq/L (ref 96–112)
Creatinine, Ser: 1.2 mg/dL (ref 0.4–1.5)
Potassium: 4.4 mEq/L (ref 3.5–5.1)
Sodium: 137 mEq/L (ref 135–145)

## 2012-08-26 MED ORDER — MORPHINE SULFATE ER 30 MG PO TBCR: 30.0000 mg | EXTENDED_RELEASE_TABLET | Freq: Three times a day (TID) | ORAL | Status: DC

## 2012-08-26 MED ORDER — OXYCODONE HCL 10 MG PO TABS: ORAL_TABLET | ORAL | Status: DC
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
## 2012-08-26 NOTE — Addendum Note (Signed)
Addended by: Baldemar Lenis R on: 08/26/2012 01:38 PM   Modules accepted: Orders

## 2012-08-26 NOTE — Assessment & Plan Note (Signed)
Chronic right osteoarthritic pain as well. Stable on current pain med regimen. Discussed situation regarding choice of treating with pain meds ongoing vs ween back off of these and see how his knee feels and if worsens then proceed with TKR on right (as his orthopedist is prepared to do whenever pt wants).  He wants to continue as is for now. No adverse side effects currently from his pain meds and no hx of suspected abuse/misuse/diversion of these meds. New rx's for June and July given today for both MS contin and oxycodone.  Will do another UDS next f/u visit.

## 2012-08-26 NOTE — Assessment & Plan Note (Signed)
I agreed to get another FLP at his next f/u in 30mo. We'll have 2 measurements about 4 mo apart to go on in determining need for med or not.  No med at this time.

## 2012-08-26 NOTE — Assessment & Plan Note (Signed)
Problem stable.  Continue current medications and diet appropriate for this condition.  We have reviewed our general long term plan for this problem and also reviewed symptoms and signs that should prompt the patient to call or return to the office.  

## 2012-08-26 NOTE — Assessment & Plan Note (Signed)
Slightly low K last f/u visit 2 mo ago: pt didn't come for repeat as requested. Check BMET today.

## 2012-08-27 ENCOUNTER — Encounter: Payer: Self-pay | Admitting: *Deleted

## 2012-10-24 ENCOUNTER — Ambulatory Visit (INDEPENDENT_AMBULATORY_CARE_PROVIDER_SITE_OTHER): Payer: Medicare Other | Admitting: Family Medicine

## 2012-10-24 ENCOUNTER — Encounter: Payer: Self-pay | Admitting: Family Medicine

## 2012-10-24 VITALS — BP 135/81 | HR 73 | Temp 97.8°F | Resp 16 | Ht 71.0 in | Wt 251.0 lb

## 2012-10-24 DIAGNOSIS — E782 Mixed hyperlipidemia: Secondary | ICD-10-CM

## 2012-10-24 DIAGNOSIS — R7309 Other abnormal glucose: Secondary | ICD-10-CM

## 2012-10-24 DIAGNOSIS — Z862 Personal history of diseases of the blood and blood-forming organs and certain disorders involving the immune mechanism: Secondary | ICD-10-CM

## 2012-10-24 DIAGNOSIS — R252 Cramp and spasm: Secondary | ICD-10-CM

## 2012-10-24 DIAGNOSIS — R739 Hyperglycemia, unspecified: Secondary | ICD-10-CM

## 2012-10-24 DIAGNOSIS — G894 Chronic pain syndrome: Secondary | ICD-10-CM

## 2012-10-24 DIAGNOSIS — Z8719 Personal history of other diseases of the digestive system: Secondary | ICD-10-CM

## 2012-10-24 DIAGNOSIS — M1710 Unilateral primary osteoarthritis, unspecified knee: Secondary | ICD-10-CM

## 2012-10-24 DIAGNOSIS — R7301 Impaired fasting glucose: Secondary | ICD-10-CM

## 2012-10-24 DIAGNOSIS — IMO0002 Reserved for concepts with insufficient information to code with codable children: Secondary | ICD-10-CM

## 2012-10-24 DIAGNOSIS — Z8639 Personal history of other endocrine, nutritional and metabolic disease: Secondary | ICD-10-CM

## 2012-10-24 DIAGNOSIS — M170 Bilateral primary osteoarthritis of knee: Secondary | ICD-10-CM

## 2012-10-24 DIAGNOSIS — E785 Hyperlipidemia, unspecified: Secondary | ICD-10-CM

## 2012-10-24 DIAGNOSIS — I10 Essential (primary) hypertension: Secondary | ICD-10-CM

## 2012-10-24 LAB — LIPID PANEL
HDL: 34 mg/dL — ABNORMAL LOW (ref >39.00)
Triglycerides: 234 mg/dL — ABNORMAL HIGH (ref 0.0–149.0)

## 2012-10-24 LAB — BASIC METABOLIC PANEL
BUN: 14 mg/dL (ref 6–23)
Calcium: 9.2 mg/dL (ref 8.4–10.5)
GFR: 72.57 mL/min (ref >60.00)
Glucose, Bld: 92 mg/dL (ref 70–99)
Sodium: 138 mEq/L (ref 135–145)

## 2012-10-24 MED ORDER — MORPHINE SULFATE ER 30 MG PO TBCR: 30.0000 mg | EXTENDED_RELEASE_TABLET | Freq: Three times a day (TID) | ORAL | Status: DC

## 2012-10-24 MED ORDER — OXYCODONE HCL 10 MG PO TABS: ORAL_TABLET | ORAL | Status: DC
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
## 2012-10-24 NOTE — Assessment & Plan Note (Signed)
Recheck FLP today. IF comparable to last check then will start 40 mg atorvastatin qd.

## 2012-10-24 NOTE — Assessment & Plan Note (Signed)
Back and knees: stable. Still not decided to get TKR on right yet. RF'd pain meds today, both morphine and oxycodone--as previously prescribed, with appropriate fill on/after dating on them (for Aug, Sept, and Oct).

## 2012-10-24 NOTE — Assessment & Plan Note (Signed)
NEEDS TO EAT BETTER DIET!  NEEDS INCREASED ACTIVITY! Lab Results  Component Value Date   HGBA1C 5.9 06/27/2012   Will check glucose on his BMET today (fasting).

## 2012-10-24 NOTE — Assessment & Plan Note (Signed)
Stable. Recheck BMET today--hx of hypokalemia.

## 2012-10-29 ENCOUNTER — Other Ambulatory Visit: Payer: Self-pay | Admitting: Family Medicine

## 2012-10-29 MED ORDER — ATORVASTATIN CALCIUM 20 MG PO TABS: 20.0000 mg | ORAL_TABLET | Freq: Every day | ORAL | Status: DC

## 2012-11-28 ENCOUNTER — Other Ambulatory Visit: Payer: Self-pay | Admitting: Family Medicine

## 2012-11-28 MED ORDER — ATORVASTATIN CALCIUM 20 MG PO TABS: 20.0000 mg | ORAL_TABLET | Freq: Every day | ORAL | Status: DC

## 2012-12-10 ENCOUNTER — Telehealth: Payer: Self-pay | Admitting: Family Medicine

## 2012-12-10 DIAGNOSIS — Z8719 Personal history of other diseases of the digestive system: Secondary | ICD-10-CM

## 2012-12-10 DIAGNOSIS — R739 Hyperglycemia, unspecified: Secondary | ICD-10-CM

## 2012-12-10 DIAGNOSIS — E785 Hyperlipidemia, unspecified: Secondary | ICD-10-CM

## 2012-12-10 DIAGNOSIS — M170 Bilateral primary osteoarthritis of knee: Secondary | ICD-10-CM

## 2012-12-10 DIAGNOSIS — I10 Essential (primary) hypertension: Secondary | ICD-10-CM

## 2012-12-10 DIAGNOSIS — G894 Chronic pain syndrome: Secondary | ICD-10-CM

## 2012-12-10 DIAGNOSIS — R252 Cramp and spasm: Secondary | ICD-10-CM

## 2012-12-10 DIAGNOSIS — E782 Mixed hyperlipidemia: Secondary | ICD-10-CM

## 2012-12-10 MED ORDER — CLONAZEPAM 1 MG PO TABS: 1.0000 mg | ORAL_TABLET | Freq: Two times a day (BID) | ORAL | Status: DC | PRN

## 2012-12-10 NOTE — Telephone Encounter (Signed)
Rx printed

## 2012-12-10 NOTE — Telephone Encounter (Signed)
Pharmacy faxed request for patient's klonopin.   Patient last seen 10/24/12.  No upcoming appointment.  Rx last printed on 06/27/12 x 5 refills.  Please advise refill.

## 2012-12-10 NOTE — Telephone Encounter (Signed)
Medication called into pharmacy.  Rx shredded.

## 2012-12-12 ENCOUNTER — Other Ambulatory Visit: Payer: Self-pay | Admitting: Family Medicine

## 2012-12-12 MED ORDER — METOPROLOL SUCCINATE ER 100 MG PO TB24: 100.0000 mg | ORAL_TABLET | Freq: Every day | ORAL | Status: DC

## 2012-12-16 ENCOUNTER — Other Ambulatory Visit: Payer: Self-pay | Admitting: Family Medicine

## 2012-12-16 DIAGNOSIS — E782 Mixed hyperlipidemia: Secondary | ICD-10-CM

## 2012-12-16 DIAGNOSIS — Z8719 Personal history of other diseases of the digestive system: Secondary | ICD-10-CM

## 2012-12-16 DIAGNOSIS — M170 Bilateral primary osteoarthritis of knee: Secondary | ICD-10-CM

## 2012-12-16 DIAGNOSIS — R252 Cramp and spasm: Secondary | ICD-10-CM

## 2012-12-16 DIAGNOSIS — I10 Essential (primary) hypertension: Secondary | ICD-10-CM

## 2012-12-16 DIAGNOSIS — E785 Hyperlipidemia, unspecified: Secondary | ICD-10-CM

## 2012-12-16 DIAGNOSIS — R739 Hyperglycemia, unspecified: Secondary | ICD-10-CM

## 2012-12-16 DIAGNOSIS — G894 Chronic pain syndrome: Secondary | ICD-10-CM

## 2012-12-16 MED ORDER — LISINOPRIL 5 MG PO TABS: 5.0000 mg | ORAL_TABLET | Freq: Every day | ORAL | Status: DC

## 2012-12-16 MED ORDER — PANTOPRAZOLE SODIUM 40 MG PO TBEC: 40.0000 mg | DELAYED_RELEASE_TABLET | Freq: Every day | ORAL | Status: DC

## 2013-01-22 ENCOUNTER — Encounter: Payer: Self-pay | Admitting: Family Medicine

## 2013-01-22 ENCOUNTER — Ambulatory Visit (INDEPENDENT_AMBULATORY_CARE_PROVIDER_SITE_OTHER): Payer: Medicare Other | Admitting: Family Medicine

## 2013-01-22 VITALS — BP 143/88 | HR 70 | Temp 98.0°F | Resp 18 | Ht 71.0 in | Wt 255.0 lb

## 2013-01-22 DIAGNOSIS — Z8719 Personal history of other diseases of the digestive system: Secondary | ICD-10-CM

## 2013-01-22 DIAGNOSIS — E785 Hyperlipidemia, unspecified: Secondary | ICD-10-CM

## 2013-01-22 DIAGNOSIS — R7309 Other abnormal glucose: Secondary | ICD-10-CM

## 2013-01-22 DIAGNOSIS — IMO0002 Reserved for concepts with insufficient information to code with codable children: Secondary | ICD-10-CM

## 2013-01-22 DIAGNOSIS — I10 Essential (primary) hypertension: Secondary | ICD-10-CM

## 2013-01-22 DIAGNOSIS — R739 Hyperglycemia, unspecified: Secondary | ICD-10-CM

## 2013-01-22 DIAGNOSIS — M170 Bilateral primary osteoarthritis of knee: Secondary | ICD-10-CM

## 2013-01-22 DIAGNOSIS — G894 Chronic pain syndrome: Secondary | ICD-10-CM

## 2013-01-22 DIAGNOSIS — R252 Cramp and spasm: Secondary | ICD-10-CM

## 2013-01-22 DIAGNOSIS — E782 Mixed hyperlipidemia: Secondary | ICD-10-CM

## 2013-01-22 DIAGNOSIS — M1710 Unilateral primary osteoarthritis, unspecified knee: Secondary | ICD-10-CM

## 2013-01-22 MED ORDER — OXYCODONE HCL 10 MG PO TABS: ORAL_TABLET | ORAL | Status: DC

## 2013-01-22 MED ORDER — MORPHINE SULFATE ER 30 MG PO TBCR: 30.0000 mg | EXTENDED_RELEASE_TABLET | Freq: Three times a day (TID) | ORAL | Status: DC
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
## 2013-01-28 NOTE — Assessment & Plan Note (Addendum)
Still recovering from left TKA.  Plans for right TKA in near future. Stable.  RF'd pain meds w/out changes--appropriate fill on/after dating on rx's---for a total of 3 months' supply. Controlled substance contract reviewed with patient today.  Patient signed this and it will be placed in the chart.   UDS today.

## 2013-01-28 NOTE — Assessment & Plan Note (Signed)
The current medical regimen is effective;  continue present plan and medications. Recheck FLP next f/u visit.

## 2013-01-28 NOTE — Assessment & Plan Note (Signed)
The current medical regimen is effective;  continue present plan and medications.  

## 2013-02-24 ENCOUNTER — Encounter: Payer: Self-pay | Admitting: Family Medicine

## 2013-03-03 ENCOUNTER — Other Ambulatory Visit: Payer: Self-pay | Admitting: Family Medicine

## 2013-03-03 DIAGNOSIS — M170 Bilateral primary osteoarthritis of knee: Secondary | ICD-10-CM

## 2013-03-03 DIAGNOSIS — Z8719 Personal history of other diseases of the digestive system: Secondary | ICD-10-CM

## 2013-03-03 DIAGNOSIS — E782 Mixed hyperlipidemia: Secondary | ICD-10-CM

## 2013-03-03 DIAGNOSIS — G894 Chronic pain syndrome: Secondary | ICD-10-CM

## 2013-03-03 DIAGNOSIS — R252 Cramp and spasm: Secondary | ICD-10-CM

## 2013-03-03 DIAGNOSIS — I10 Essential (primary) hypertension: Secondary | ICD-10-CM

## 2013-03-03 DIAGNOSIS — E785 Hyperlipidemia, unspecified: Secondary | ICD-10-CM

## 2013-03-03 DIAGNOSIS — R739 Hyperglycemia, unspecified: Secondary | ICD-10-CM

## 2013-03-03 MED ORDER — OXYCODONE HCL 10 MG PO TABS: ORAL_TABLET | ORAL | Status: DC

## 2013-03-03 MED ORDER — MORPHINE SULFATE ER 30 MG PO TBCR: 30.0000 mg | EXTENDED_RELEASE_TABLET | Freq: Three times a day (TID) | ORAL | Status: DC

## 2013-03-03 NOTE — Progress Notes (Signed)
I inadvertently switched the "RF on or after" dating on his recent oxycodone and MS contin 30mg  rx's: I printed corrected rx's today for MS contin 30mg , fill on or after 03/01/13 and Oxycodone 10mg  fill on or after 04/24/13.

## 2013-04-03 ENCOUNTER — Other Ambulatory Visit: Payer: Self-pay | Admitting: Family Medicine

## 2013-04-03 MED ORDER — HYDROCHLOROTHIAZIDE 25 MG PO TABS: 25.0000 mg | ORAL_TABLET | Freq: Every day | ORAL | Status: DC

## 2013-04-22 ENCOUNTER — Encounter: Payer: Self-pay | Admitting: Family Medicine

## 2013-04-22 ENCOUNTER — Ambulatory Visit (INDEPENDENT_AMBULATORY_CARE_PROVIDER_SITE_OTHER): Payer: Medicare Other | Admitting: Family Medicine

## 2013-04-22 VITALS — BP 114/82 | HR 79 | Temp 98.9°F | Resp 18 | Ht 71.0 in | Wt 250.0 lb

## 2013-04-22 DIAGNOSIS — G43109 Migraine with aura, not intractable, without status migrainosus: Secondary | ICD-10-CM

## 2013-04-22 DIAGNOSIS — IMO0002 Reserved for concepts with insufficient information to code with codable children: Secondary | ICD-10-CM

## 2013-04-22 DIAGNOSIS — G894 Chronic pain syndrome: Secondary | ICD-10-CM

## 2013-04-22 DIAGNOSIS — R739 Hyperglycemia, unspecified: Secondary | ICD-10-CM

## 2013-04-22 DIAGNOSIS — M1710 Unilateral primary osteoarthritis, unspecified knee: Secondary | ICD-10-CM

## 2013-04-22 DIAGNOSIS — R7309 Other abnormal glucose: Secondary | ICD-10-CM

## 2013-04-22 DIAGNOSIS — M170 Bilateral primary osteoarthritis of knee: Secondary | ICD-10-CM

## 2013-04-22 DIAGNOSIS — Z8719 Personal history of other diseases of the digestive system: Secondary | ICD-10-CM

## 2013-04-22 DIAGNOSIS — G43909 Migraine, unspecified, not intractable, without status migrainosus: Secondary | ICD-10-CM

## 2013-04-22 DIAGNOSIS — I10 Essential (primary) hypertension: Secondary | ICD-10-CM

## 2013-04-22 DIAGNOSIS — E782 Mixed hyperlipidemia: Secondary | ICD-10-CM

## 2013-04-22 DIAGNOSIS — R252 Cramp and spasm: Secondary | ICD-10-CM

## 2013-04-22 DIAGNOSIS — E785 Hyperlipidemia, unspecified: Secondary | ICD-10-CM

## 2013-04-22 MED ORDER — MORPHINE SULFATE ER 30 MG PO TBCR: 30.0000 mg | EXTENDED_RELEASE_TABLET | Freq: Three times a day (TID) | ORAL | Status: DC

## 2013-04-22 MED ORDER — OXYCODONE HCL 10 MG PO TABS: ORAL_TABLET | ORAL | Status: DC

## 2013-04-22 MED ORDER — RIZATRIPTAN BENZOATE 10 MG PO TABS
ORAL_TABLET | ORAL | Status: DC
Start: 2013-04-22 — End: 2013-12-20

## 2013-04-22 MED ORDER — LISINOPRIL 10 MG PO TABS: 10.0000 mg | ORAL_TABLET | Freq: Every day | ORAL | Status: DC
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
## 2013-04-22 NOTE — Progress Notes (Signed)
Pre visit review using our clinic review tool, if applicable. No additional management support is needed unless otherwise documented below in the visit note. 

## 2013-04-23 ENCOUNTER — Telehealth: Payer: Self-pay | Admitting: Family Medicine

## 2013-04-23 NOTE — Telephone Encounter (Signed)
Relevant patient education assigned to patient using Emmi. ° °

## 2013-05-16 ENCOUNTER — Other Ambulatory Visit: Payer: Self-pay

## 2013-05-16 ENCOUNTER — Telehealth: Payer: Self-pay

## 2013-05-16 DIAGNOSIS — R252 Cramp and spasm: Secondary | ICD-10-CM

## 2013-05-16 DIAGNOSIS — M170 Bilateral primary osteoarthritis of knee: Secondary | ICD-10-CM

## 2013-05-16 DIAGNOSIS — E785 Hyperlipidemia, unspecified: Secondary | ICD-10-CM

## 2013-05-16 DIAGNOSIS — R739 Hyperglycemia, unspecified: Secondary | ICD-10-CM

## 2013-05-16 DIAGNOSIS — Z8719 Personal history of other diseases of the digestive system: Secondary | ICD-10-CM

## 2013-05-16 DIAGNOSIS — I10 Essential (primary) hypertension: Secondary | ICD-10-CM

## 2013-05-16 DIAGNOSIS — E782 Mixed hyperlipidemia: Secondary | ICD-10-CM

## 2013-05-16 DIAGNOSIS — G894 Chronic pain syndrome: Secondary | ICD-10-CM

## 2013-05-16 MED ORDER — CLONAZEPAM 1 MG PO TABS: 1.0000 mg | ORAL_TABLET | Freq: Two times a day (BID) | ORAL | Status: DC | PRN

## 2013-05-16 NOTE — Telephone Encounter (Signed)
Faxed

## 2013-05-16 NOTE — Telephone Encounter (Signed)
Pharmacy faxed over Rx refill for Clonazepam 1 mg tablet. Last fill was on 12/10/2012. Last office visit on

## 2013-05-16 NOTE — Telephone Encounter (Signed)
Clonazepam rx printed. 

## 2013-05-16 NOTE — Telephone Encounter (Signed)
Last office visit 04/22/2013. Quantity #60 with 5 refills

## 2013-06-11 ENCOUNTER — Other Ambulatory Visit: Payer: Self-pay | Admitting: Family Medicine

## 2013-06-12 ENCOUNTER — Other Ambulatory Visit: Payer: Self-pay | Admitting: Family Medicine

## 2013-06-27 ENCOUNTER — Other Ambulatory Visit: Payer: Self-pay | Admitting: Family Medicine

## 2013-07-21 ENCOUNTER — Ambulatory Visit (INDEPENDENT_AMBULATORY_CARE_PROVIDER_SITE_OTHER): Payer: Medicare Other | Admitting: Family Medicine

## 2013-07-21 ENCOUNTER — Encounter: Payer: Self-pay | Admitting: Family Medicine

## 2013-07-21 VITALS — BP 120/80 | HR 78 | Temp 98.2°F | Ht 71.0 in | Wt 252.0 lb

## 2013-07-21 DIAGNOSIS — Z8719 Personal history of other diseases of the digestive system: Secondary | ICD-10-CM

## 2013-07-21 DIAGNOSIS — M170 Bilateral primary osteoarthritis of knee: Secondary | ICD-10-CM

## 2013-07-21 DIAGNOSIS — R7309 Other abnormal glucose: Secondary | ICD-10-CM

## 2013-07-21 DIAGNOSIS — G894 Chronic pain syndrome: Secondary | ICD-10-CM

## 2013-07-21 DIAGNOSIS — I10 Essential (primary) hypertension: Secondary | ICD-10-CM

## 2013-07-21 DIAGNOSIS — IMO0002 Reserved for concepts with insufficient information to code with codable children: Secondary | ICD-10-CM

## 2013-07-21 DIAGNOSIS — R252 Cramp and spasm: Secondary | ICD-10-CM

## 2013-07-21 DIAGNOSIS — R739 Hyperglycemia, unspecified: Secondary | ICD-10-CM

## 2013-07-21 DIAGNOSIS — E782 Mixed hyperlipidemia: Secondary | ICD-10-CM

## 2013-07-21 DIAGNOSIS — M1710 Unilateral primary osteoarthritis, unspecified knee: Secondary | ICD-10-CM

## 2013-07-21 DIAGNOSIS — E785 Hyperlipidemia, unspecified: Secondary | ICD-10-CM

## 2013-07-21 MED ORDER — MORPHINE SULFATE ER 30 MG PO TBCR: 30.0000 mg | EXTENDED_RELEASE_TABLET | Freq: Three times a day (TID) | ORAL | Status: DC

## 2013-07-21 MED ORDER — OXYCODONE HCL 10 MG PO TABS: ORAL_TABLET | ORAL | Status: DC
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
## 2013-07-21 NOTE — Progress Notes (Signed)
Pre visit review using our clinic review tool, if applicable. No additional management support is needed unless otherwise documented below in the visit note. 

## 2013-08-01 ENCOUNTER — Telehealth: Payer: Self-pay | Admitting: Family Medicine

## 2013-08-01 NOTE — Telephone Encounter (Signed)
Patient Information:  Caller Name: Tresten  Phone: [phone]  Patient: Richard Levine, Richard Levine  Gender: Male  DOB: December 16, 1950  Age: 63 Years  PCP: Ricardo Jericho [facility])  Office Follow Up:  Does the office need to follow up with this patient?: No  Instructions For The Office: N/A  RN Note:  Home care advice given. Pt will call office back on Monday to schedule an appt if arm has not improved by then.  Symptoms  Reason For Call & Symptoms: Pt calling because he fell about 4 days ago and landed on right arm. Elbow is swollen,sli warm to touch and sli painful to touch. Is able move it and doesn't hurt to move it. Swelling and pain have improved since fall and continues to improve. Pt is calling to see if office is able to do xrays.  Reviewed Health History In EMR: Yes  Reviewed Medications In EMR: Yes  Reviewed Allergies In EMR: Yes  Reviewed Surgeries / Procedures: Yes  Date of Onset of Symptoms: 07/29/2013  Treatments Tried: Ibuprofen,Tylenol, ice  Treatments Tried Worked: No  Guideline(s) Used:  Elbow Injury  Disposition Per Guideline:   Home Care  Reason For Disposition Reached:   Minor elbow injury  Advice Given:  Apply a Cold Pack:  Continue this for the first 48 hours after an injury.  This will help decrease pain and swelling.  Apply Heat to the Area:  Beginning 48 hours after an injury, apply a warm washcloth or heating pad for 10 minutes three times a day.  Elevate the Arm:  Lay down and put your arm on a pillow. This puts (elevates) the elbow above the heart.  This can also help decrease swelling, bruising, and pain.  Rest vs. Movement:  Movement is generally more healing in the long term than rest.  Continue normal activities as much as your pain permits.  Call Back If:  Pain becomes severe  Pain does not improve after 3 days  You become worse.  Patient Will Follow Care Advice:  YES

## 2013-09-13 ENCOUNTER — Other Ambulatory Visit: Payer: Self-pay | Admitting: Family Medicine

## 2013-09-16 ENCOUNTER — Telehealth: Payer: Self-pay | Admitting: Family Medicine

## 2013-09-16 ENCOUNTER — Encounter (HOSPITAL_COMMUNITY): Payer: Self-pay | Admitting: Emergency Medicine

## 2013-09-16 ENCOUNTER — Emergency Department (HOSPITAL_COMMUNITY)
Admission: EM | Admit: 2013-09-16 | Discharge: 2013-09-16 | Disposition: A | Discharge status: Home / Self Care | Payer: Medicare Other | Attending: Emergency Medicine | Admitting: Emergency Medicine

## 2013-09-16 DIAGNOSIS — Z7982 Long term (current) use of aspirin: Secondary | ICD-10-CM | POA: Diagnosis not present

## 2013-09-16 DIAGNOSIS — F329 Major depressive disorder, single episode, unspecified: Secondary | ICD-10-CM | POA: Insufficient documentation

## 2013-09-16 DIAGNOSIS — Z9889 Other specified postprocedural states: Secondary | ICD-10-CM | POA: Insufficient documentation

## 2013-09-16 DIAGNOSIS — F3289 Other specified depressive episodes: Secondary | ICD-10-CM | POA: Insufficient documentation

## 2013-09-16 DIAGNOSIS — K859 Acute pancreatitis without necrosis or infection, unspecified: Secondary | ICD-10-CM | POA: Insufficient documentation

## 2013-09-16 DIAGNOSIS — G43909 Migraine, unspecified, not intractable, without status migrainosus: Secondary | ICD-10-CM | POA: Insufficient documentation

## 2013-09-16 DIAGNOSIS — Z79899 Other long term (current) drug therapy: Secondary | ICD-10-CM | POA: Diagnosis not present

## 2013-09-16 DIAGNOSIS — N182 Chronic kidney disease, stage 2 (mild): Secondary | ICD-10-CM | POA: Insufficient documentation

## 2013-09-16 DIAGNOSIS — G8929 Other chronic pain: Secondary | ICD-10-CM | POA: Diagnosis not present

## 2013-09-16 DIAGNOSIS — Z87448 Personal history of other diseases of urinary system: Secondary | ICD-10-CM | POA: Diagnosis not present

## 2013-09-16 DIAGNOSIS — K219 Gastro-esophageal reflux disease without esophagitis: Secondary | ICD-10-CM | POA: Diagnosis not present

## 2013-09-16 DIAGNOSIS — I129 Hypertensive chronic kidney disease with stage 1 through stage 4 chronic kidney disease, or unspecified chronic kidney disease: Secondary | ICD-10-CM | POA: Diagnosis not present

## 2013-09-16 DIAGNOSIS — M1990 Unspecified osteoarthritis, unspecified site: Secondary | ICD-10-CM | POA: Diagnosis not present

## 2013-09-16 DIAGNOSIS — K8500 Idiopathic acute pancreatitis without necrosis or infection: Secondary | ICD-10-CM

## 2013-09-16 DIAGNOSIS — E782 Mixed hyperlipidemia: Secondary | ICD-10-CM | POA: Insufficient documentation

## 2013-09-16 DIAGNOSIS — R109 Unspecified abdominal pain: Secondary | ICD-10-CM | POA: Diagnosis present

## 2013-09-16 DIAGNOSIS — F411 Generalized anxiety disorder: Secondary | ICD-10-CM | POA: Diagnosis not present

## 2013-09-16 LAB — CBC WITH DIFFERENTIAL/PLATELET
Basophils Absolute: 0 10*3/uL (ref 0.0–0.1)
Basophils Relative: 0 % (ref 0–1)
Eosinophils Absolute: 0 10*3/uL (ref 0.0–0.7)
Eosinophils Relative: 0 % (ref 0–5)
HCT: 44.2 % (ref 39.0–52.0)
Hemoglobin: 15.8 g/dL (ref 13.0–17.0)
Lymphocytes Relative: 7 % — ABNORMAL LOW (ref 12–46)
Lymphs Abs: 0.9 10*3/uL (ref 0.7–4.0)
MCH: 32.8 pg (ref 26.0–34.0)
MCHC: 35.7 g/dL (ref 30.0–36.0)
MCV: 91.9 fL (ref 78.0–100.0)
Monocytes Absolute: 0.5 10*3/uL (ref 0.1–1.0)
Monocytes Relative: 4 % (ref 3–12)
Neutro Abs: 10.4 10*3/uL — ABNORMAL HIGH (ref 1.7–7.7)
Neutrophils Relative %: 89 % — ABNORMAL HIGH (ref 43–77)
Platelets: 217 10*3/uL (ref 150–400)
RBC: 4.81 MIL/uL (ref 4.22–5.81)
RDW: 12.7 % (ref 11.5–15.5)
WBC: 11.8 10*3/uL — ABNORMAL HIGH (ref 4.0–10.5)

## 2013-09-16 LAB — COMPREHENSIVE METABOLIC PANEL
ALT: 20 U/L (ref 0–53)
AST: 19 U/L (ref 0–37)
Albumin: 4.2 g/dL (ref 3.5–5.2)
Alkaline Phosphatase: 72 U/L (ref 39–117)
BUN: 18 mg/dL (ref 6–23)
CO2: 27 mEq/L (ref 19–32)
Calcium: 9.9 mg/dL (ref 8.4–10.5)
Chloride: 99 mEq/L (ref 96–112)
Creatinine, Ser: 1.11 mg/dL (ref 0.50–1.35)
GFR calc Af Amer: 82 mL/min — ABNORMAL LOW (ref >90)
GFR calc non Af Amer: 70 mL/min — ABNORMAL LOW (ref >90)
Glucose, Bld: 153 mg/dL — ABNORMAL HIGH (ref 70–99)
Potassium: 3.8 mEq/L (ref 3.7–5.3)
Sodium: 142 mEq/L (ref 137–147)
Total Bilirubin: 0.9 mg/dL (ref 0.3–1.2)
Total Protein: 8.2 g/dL (ref 6.0–8.3)

## 2013-09-16 LAB — LIPASE, BLOOD: Lipase: 120 U/L — ABNORMAL HIGH (ref 11–59)

## 2013-09-16 MED ORDER — HYDROMORPHONE HCL PF 1 MG/ML IJ SOLN
1.0000 mg | Freq: Once | INTRAMUSCULAR | Status: AC
  Administered 2013-09-16: 1 mg via INTRAVENOUS
  Filled 2013-09-16: qty 1

## 2013-09-16 MED ORDER — ONDANSETRON HCL 4 MG/2ML IJ SOLN
4.0000 mg | Freq: Once | INTRAMUSCULAR | Status: AC
  Administered 2013-09-16: 4 mg via INTRAMUSCULAR

## 2013-09-16 MED ORDER — HYDROMORPHONE HCL PF 1 MG/ML IJ SOLN
1.0000 mg | Freq: Once | INTRAMUSCULAR | Status: AC
  Administered 2013-09-16: 1 mg via INTRAVENOUS

## 2013-09-16 MED ORDER — PROMETHAZINE HCL 12.5 MG PO TABS: 12.5000 mg | ORAL_TABLET | Freq: Four times a day (QID) | ORAL | Status: DC | PRN

## 2013-09-16 MED ORDER — SODIUM CHLORIDE 0.9 % IV BOLUS (SEPSIS)
1000.0000 mL | Freq: Once | INTRAVENOUS | Status: AC
  Administered 2013-09-16: 1000 mL via INTRAVENOUS

## 2013-09-16 MED ORDER — PROMETHAZINE HCL 25 MG PO TABS: 25.0000 mg | ORAL_TABLET | Freq: Four times a day (QID) | ORAL | Status: DC | PRN

## 2013-09-16 MED ORDER — PROMETHAZINE HCL 25 MG/ML IJ SOLN
12.5000 mg | Freq: Once | INTRAMUSCULAR | Status: AC
Start: 2013-09-16 — End: 2013-09-16
  Administered 2013-09-16: 12.5 mg via INTRAVENOUS
  Filled 2013-09-16: qty 1

## 2013-09-16 NOTE — Telephone Encounter (Signed)
Agreed -

## 2013-09-16 NOTE — Telephone Encounter (Signed)
Patient's wife called stating patient is on his way back from [place] and has nausea and vomiting.  Per Dr. Anitra Lauth, Okay to send in phenergan # 30 x 1 rf for patient.

## 2013-09-16 NOTE — ED Notes (Signed)
Pt c/o epigastric pain x1-2 days along with n/v/d.
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
## 2013-09-17 ENCOUNTER — Telehealth: Payer: Self-pay | Admitting: Family Medicine

## 2013-09-17 NOTE — Telephone Encounter (Signed)
Patient's wife called and LMOM stating that she just wanted to make Dr. Anitra Lauth aware that pt was going to [facility] ER for possible pancreatitis.

## 2013-10-13 ENCOUNTER — Telehealth: Payer: Self-pay | Admitting: Family Medicine

## 2013-10-13 MED ORDER — LISINOPRIL 10 MG PO TABS: 10.0000 mg | ORAL_TABLET | Freq: Every day | ORAL | Status: DC

## 2013-10-13 MED ORDER — ATORVASTATIN CALCIUM 20 MG PO TABS: 20.0000 mg | ORAL_TABLET | Freq: Every day | ORAL | Status: DC

## 2013-10-13 MED ORDER — CLONAZEPAM 1 MG PO TABS: 1.0000 mg | ORAL_TABLET | Freq: Two times a day (BID) | ORAL | Status: DC | PRN

## 2013-10-13 NOTE — Telephone Encounter (Signed)
Rx faxed to pharmacy  

## 2013-10-13 NOTE — Telephone Encounter (Signed)
Pt requesting rf of klonopin.  Last RF fill date was 09/16/13.  Last OV was 07/21/13.   Please advise rf.

## 2013-10-13 NOTE — Telephone Encounter (Signed)
Clonazepam rx printed. 

## 2013-10-20 ENCOUNTER — Other Ambulatory Visit: Payer: Self-pay | Admitting: Family Medicine

## 2013-10-22 ENCOUNTER — Ambulatory Visit: Payer: Medicare Other | Admitting: Family Medicine

## 2013-10-24 ENCOUNTER — Encounter: Payer: Self-pay | Admitting: Family Medicine

## 2013-10-24 ENCOUNTER — Ambulatory Visit (INDEPENDENT_AMBULATORY_CARE_PROVIDER_SITE_OTHER): Payer: Medicare Other | Admitting: Family Medicine

## 2013-10-24 VITALS — BP 157/90 | HR 74 | Temp 98.0°F | Resp 18 | Ht 71.0 in | Wt 259.0 lb

## 2013-10-24 DIAGNOSIS — S5000XA Contusion of unspecified elbow, initial encounter: Secondary | ICD-10-CM

## 2013-10-24 DIAGNOSIS — I10 Essential (primary) hypertension: Secondary | ICD-10-CM

## 2013-10-24 DIAGNOSIS — S5001XA Contusion of right elbow, initial encounter: Secondary | ICD-10-CM

## 2013-10-24 DIAGNOSIS — Z8719 Personal history of other diseases of the digestive system: Secondary | ICD-10-CM

## 2013-10-24 DIAGNOSIS — E785 Hyperlipidemia, unspecified: Secondary | ICD-10-CM

## 2013-10-24 DIAGNOSIS — R252 Cramp and spasm: Secondary | ICD-10-CM

## 2013-10-24 DIAGNOSIS — R7309 Other abnormal glucose: Secondary | ICD-10-CM

## 2013-10-24 DIAGNOSIS — R739 Hyperglycemia, unspecified: Secondary | ICD-10-CM

## 2013-10-24 DIAGNOSIS — E782 Mixed hyperlipidemia: Secondary | ICD-10-CM

## 2013-10-24 DIAGNOSIS — G894 Chronic pain syndrome: Secondary | ICD-10-CM

## 2013-10-24 MED ORDER — OXYCODONE HCL 10 MG PO TABS: 10.0000 mg | ORAL_TABLET | Freq: Three times a day (TID) | ORAL | Status: DC | PRN

## 2013-10-24 MED ORDER — MORPHINE SULFATE ER 30 MG PO TBCR: 30.0000 mg | EXTENDED_RELEASE_TABLET | Freq: Three times a day (TID) | ORAL | Status: DC

## 2013-10-24 NOTE — Progress Notes (Signed)
Pre visit review using our clinic review tool, if applicable. No additional management support is needed unless otherwise documented below in the visit note. 

## 2013-10-24 NOTE — Progress Notes (Addendum)
OFFICE NOTE  10/26/2013  CC:  Chief Complaint  Patient presents with  . Follow-up  . Elbow Injury    right sided    HPI: Patient is a 63 y.o. Caucasian male who is here for 3 mo f/u chronic pain syndrome/arthritis, HTN, hyperlipidemia.20s/ [place] home bp monitoring 120s/80s.   Pharmacy issue caused pt to be off his statin for several months, just got back on it a month ago.  He is not fasting today.  [place] about 1 mo ago and hit elbow on a fence.  It swelled and bruised initially but he could still move it normally.  Had an indented area proximal to olecranon, still somewhat TTP on olecranon but all sx's have steadily been improving.  Compliant with all chronic meds, pain control is good on current regimen.  No side effects from pain meds. He had another episode of idiopathic pancreatitis 08/2013.  Did not require inpatient stay but went to ED.  CT abd was not repeated.   Pertinent PMH:  Past medical, surgical, social, and family history reviewed and no changes are noted since last office visit.  MEDS:  Outpatient Prescriptions Prior to Visit  Medication Sig Dispense Refill  . aspirin EC 81 MG tablet Take 81 mg by mouth every morning.      Marland Kitchen atorvastatin (LIPITOR) 20 MG tablet Take 1 tablet (20 mg total) by mouth daily.  90 tablet  1  . clonazePAM (KLONOPIN) 1 MG tablet Take 1 tablet (1 mg total) by mouth 2 (two) times daily as needed for anxiety.  60 tablet  5  . hydrochlorothiazide (HYDRODIURIL) 25 MG tablet TAKE 1 TABLET BY MOUTH EVERY DAY  90 tablet  1  . lisinopril (PRINIVIL,ZESTRIL) 10 MG tablet Take 1 tablet (10 mg total) by mouth daily.  90 tablet  1  . metoprolol succinate (TOPROL-XL) 100 MG 24 hr tablet Take 100 mg by mouth every morning. Take with or immediately following a meal.      . pantoprazole (PROTONIX) 40 MG tablet Take 40 mg by mouth daily.      . promethazine (PHENERGAN) 12.5 MG tablet Take 1 tablet (12.5 mg total) by mouth every 6 (six) hours as needed for nausea.   30 tablet  1  . rizatriptan (MAXALT) 10 MG tablet 1 tab po at onset of headache, may repeat dose in 2 hours if headache is not 50% improved.  Max dose in 24h period is 30mg .  9 tablet  3  . sucralfate (CARAFATE) 1 G tablet Take 1 g by mouth 4 (four) times daily -  with meals and at bedtime. As Needed.      . hydrochlorothiazide (HYDRODIURIL) 25 MG tablet Take 25 mg by mouth every morning.      Marland Kitchen morphine (MS CONTIN) 30 MG 12 hr tablet Take 1 tablet (30 mg total) by mouth every 8 (eight) hours.  90 tablet  0  . Oxycodone HCl 10 MG TABS Take 10 mg by mouth 3 (three) times daily as needed (for pain).      . promethazine (PHENERGAN) 25 MG tablet Take 1 tablet (25 mg total) by mouth every 6 (six) hours as needed for nausea or vomiting.  20 tablet  0   No facility-administered medications prior to visit.    PE: Blood pressure 157/90, pulse 74, temperature 98 F (36.7 C), temperature source Temporal, resp. rate 18, height 5\' 11"  (1.803 m), weight 259 lb (117.482 kg), SpO2 93.00%. CV: RRR, no m/r/g.   LUNGS:  CTA bilat, nonlabored resps, good aeration in all lung fields. EXT: 2-3+ pitting edema in both pretibial regions down into ankles Right elbow with no swelling, deformity, erythema,or tenderness to palpation.  ROM normal.   IMPRESSION AND PLAN:  HYPERTENSION The current medical regimen is effective;  continue present plan and medications.   Hyperlipidemia, mixed Tolerating atorv 20 qd, but spent time off med due to some pharmacy or pt error. He has been back on it for 92mo now, will plan on rechecking FLP in 79mo at next o/v.  Pain syndrome, chronic Low back DDD and right knee severe osteoarthritis. Doing well on current pain med regimen. I printed rx's for MS Continu 30mg  bid, #60 and oxycodone 10mg  tid prn, #90 today for this month, August, and September 2015.  Appropriate fill on/after date was noted on each rx.   Recent right elbow contusion: resolving appropriately.  An After  Visit Summary was printed and given to the patient.  FOLLOW UP: 40mo

## 2013-10-26 ENCOUNTER — Encounter: Payer: Self-pay | Admitting: Family Medicine

## 2013-10-26 NOTE — Assessment & Plan Note (Addendum)
Tolerating atorv 20 qd, but spent time off med due to some pharmacy or pt error. He has been back on it for 44mo now, will plan on rechecking FLP in 47mo at next o/v.

## 2013-10-26 NOTE — Assessment & Plan Note (Signed)
The current medical regimen is effective;  continue present plan and medications.  

## 2013-10-26 NOTE — Assessment & Plan Note (Addendum)
Low back DDD and right knee severe osteoarthritis. Doing well on current pain med regimen. I printed rx's for MS Continu 30mg  bid, #60 and oxycodone 10mg  tid prn, #90 today for this month, August, and September 2015.  Appropriate fill on/after date was noted on each rx.

## 2013-12-08 ENCOUNTER — Other Ambulatory Visit: Payer: Self-pay | Admitting: Family Medicine

## 2013-12-20 ENCOUNTER — Encounter (HOSPITAL_COMMUNITY): Payer: Self-pay | Admitting: Emergency Medicine

## 2013-12-20 ENCOUNTER — Inpatient Hospital Stay (HOSPITAL_COMMUNITY)
Admission: EM | Admit: 2013-12-20 | Discharge: 2013-12-22 | DRG: 392 | Disposition: A | Discharge status: Home / Self Care | Payer: Medicare Other | Attending: Internal Medicine | Admitting: Internal Medicine

## 2013-12-20 ENCOUNTER — Emergency Department (HOSPITAL_COMMUNITY): Payer: Medicare Other

## 2013-12-20 DIAGNOSIS — R109 Unspecified abdominal pain: Secondary | ICD-10-CM | POA: Diagnosis present

## 2013-12-20 DIAGNOSIS — G8929 Other chronic pain: Secondary | ICD-10-CM

## 2013-12-20 DIAGNOSIS — D649 Anemia, unspecified: Secondary | ICD-10-CM | POA: Diagnosis present

## 2013-12-20 DIAGNOSIS — N182 Chronic kidney disease, stage 2 (mild): Secondary | ICD-10-CM | POA: Diagnosis present

## 2013-12-20 DIAGNOSIS — R1013 Epigastric pain: Secondary | ICD-10-CM | POA: Diagnosis not present

## 2013-12-20 DIAGNOSIS — R112 Nausea with vomiting, unspecified: Secondary | ICD-10-CM | POA: Diagnosis present

## 2013-12-20 DIAGNOSIS — K219 Gastro-esophageal reflux disease without esophagitis: Secondary | ICD-10-CM | POA: Diagnosis present

## 2013-12-20 DIAGNOSIS — G40909 Epilepsy, unspecified, not intractable, without status epilepticus: Secondary | ICD-10-CM | POA: Diagnosis present

## 2013-12-20 DIAGNOSIS — Z96659 Presence of unspecified artificial knee joint: Secondary | ICD-10-CM | POA: Diagnosis not present

## 2013-12-20 DIAGNOSIS — Z7982 Long term (current) use of aspirin: Secondary | ICD-10-CM

## 2013-12-20 DIAGNOSIS — R197 Diarrhea, unspecified: Secondary | ICD-10-CM | POA: Diagnosis present

## 2013-12-20 DIAGNOSIS — Z8249 Family history of ischemic heart disease and other diseases of the circulatory system: Secondary | ICD-10-CM

## 2013-12-20 DIAGNOSIS — G894 Chronic pain syndrome: Secondary | ICD-10-CM | POA: Diagnosis present

## 2013-12-20 DIAGNOSIS — M545 Low back pain, unspecified: Secondary | ICD-10-CM

## 2013-12-20 DIAGNOSIS — Z8719 Personal history of other diseases of the digestive system: Secondary | ICD-10-CM

## 2013-12-20 DIAGNOSIS — Z79899 Other long term (current) drug therapy: Secondary | ICD-10-CM | POA: Diagnosis not present

## 2013-12-20 DIAGNOSIS — R1115 Cyclical vomiting syndrome unrelated to migraine: Secondary | ICD-10-CM

## 2013-12-20 DIAGNOSIS — R1110 Vomiting, unspecified: Secondary | ICD-10-CM

## 2013-12-20 DIAGNOSIS — F430 Acute stress reaction: Secondary | ICD-10-CM | POA: Diagnosis present

## 2013-12-20 DIAGNOSIS — Z801 Family history of malignant neoplasm of trachea, bronchus and lung: Secondary | ICD-10-CM

## 2013-12-20 DIAGNOSIS — R7301 Impaired fasting glucose: Secondary | ICD-10-CM

## 2013-12-20 DIAGNOSIS — F411 Generalized anxiety disorder: Secondary | ICD-10-CM | POA: Diagnosis present

## 2013-12-20 DIAGNOSIS — F329 Major depressive disorder, single episode, unspecified: Secondary | ICD-10-CM | POA: Diagnosis present

## 2013-12-20 DIAGNOSIS — M810 Age-related osteoporosis without current pathological fracture: Secondary | ICD-10-CM | POA: Diagnosis present

## 2013-12-20 DIAGNOSIS — T4275XA Adverse effect of unspecified antiepileptic and sedative-hypnotic drugs, initial encounter: Secondary | ICD-10-CM | POA: Diagnosis present

## 2013-12-20 DIAGNOSIS — R1010 Upper abdominal pain, unspecified: Secondary | ICD-10-CM

## 2013-12-20 DIAGNOSIS — S5001XA Contusion of right elbow, initial encounter: Secondary | ICD-10-CM

## 2013-12-20 DIAGNOSIS — F3289 Other specified depressive episodes: Secondary | ICD-10-CM | POA: Diagnosis present

## 2013-12-20 DIAGNOSIS — E782 Mixed hyperlipidemia: Secondary | ICD-10-CM | POA: Diagnosis present

## 2013-12-20 DIAGNOSIS — I129 Hypertensive chronic kidney disease with stage 1 through stage 4 chronic kidney disease, or unspecified chronic kidney disease: Secondary | ICD-10-CM | POA: Diagnosis present

## 2013-12-20 DIAGNOSIS — M1990 Unspecified osteoarthritis, unspecified site: Secondary | ICD-10-CM | POA: Diagnosis present

## 2013-12-20 DIAGNOSIS — I10 Essential (primary) hypertension: Secondary | ICD-10-CM

## 2013-12-20 DIAGNOSIS — K5909 Other constipation: Secondary | ICD-10-CM

## 2013-12-20 HISTORY — DX: Pain in unspecified knee: M25.569

## 2013-12-20 HISTORY — DX: Other chronic pain: G89.29

## 2013-12-20 HISTORY — DX: Diverticulosis of intestine, part unspecified, without perforation or abscess without bleeding: K57.90

## 2013-12-20 LAB — CBC WITH DIFFERENTIAL/PLATELET
BASOS ABS: 0 10*3/uL (ref 0.0–0.1)
Basophils Relative: 0 % (ref 0–1)
Eosinophils Absolute: 0 10*3/uL (ref 0.0–0.7)
Eosinophils Relative: 0 % (ref 0–5)
HCT: 44.3 % (ref 39.0–52.0)
Hemoglobin: 15.8 g/dL (ref 13.0–17.0)
Lymphocytes Relative: 18 % (ref 12–46)
Lymphs Abs: 1.4 10*3/uL (ref 0.7–4.0)
MCH: 33 pg (ref 26.0–34.0)
MCHC: 35.7 g/dL (ref 30.0–36.0)
MCV: 92.5 fL (ref 78.0–100.0)
MONO ABS: 0.6 10*3/uL (ref 0.1–1.0)
Monocytes Relative: 8 % (ref 3–12)
NEUTROS ABS: 5.7 10*3/uL (ref 1.7–7.7)
Neutrophils Relative %: 74 % (ref 43–77)
PLATELETS: 246 10*3/uL (ref 150–400)
RBC: 4.79 MIL/uL (ref 4.22–5.81)
RDW: 12.6 % (ref 11.5–15.5)
WBC: 7.7 10*3/uL (ref 4.0–10.5)

## 2013-12-20 LAB — LIPASE, BLOOD: LIPASE: 74 U/L — AB (ref 11–59)

## 2013-12-20 LAB — COMPREHENSIVE METABOLIC PANEL
ALT: 21 U/L (ref 0–53)
AST: 17 U/L (ref 0–37)
Albumin: 3.8 g/dL (ref 3.5–5.2)
Alkaline Phosphatase: 74 U/L (ref 39–117)
Anion gap: 13 (ref 5–15)
BUN: 13 mg/dL (ref 6–23)
CHLORIDE: 97 meq/L (ref 96–112)
CO2: 26 mEq/L (ref 19–32)
CREATININE: 1.23 mg/dL (ref 0.50–1.35)
Calcium: 9.5 mg/dL (ref 8.4–10.5)
GFR calc Af Amer: 72 mL/min — ABNORMAL LOW (ref >90)
GFR calc non Af Amer: 62 mL/min — ABNORMAL LOW (ref >90)
Glucose, Bld: 198 mg/dL — ABNORMAL HIGH (ref 70–99)
Potassium: 3.6 mEq/L — ABNORMAL LOW (ref 3.7–5.3)
SODIUM: 136 meq/L — AB (ref 137–147)
Total Bilirubin: 0.8 mg/dL (ref 0.3–1.2)
Total Protein: 8 g/dL (ref 6.0–8.3)

## 2013-12-20 MED ORDER — ONDANSETRON HCL 4 MG PO TABS: 4.0000 mg | ORAL_TABLET | Freq: Four times a day (QID) | ORAL | Status: DC | PRN

## 2013-12-20 MED ORDER — SODIUM CHLORIDE 0.9 % IV SOLN
INTRAVENOUS | Status: AC
  Administered 2013-12-20: 22:00:00 via INTRAVENOUS

## 2013-12-20 MED ORDER — SODIUM CHLORIDE 0.9 % IV SOLN
INTRAVENOUS | Status: DC
  Administered 2013-12-20: 19:00:00 via INTRAVENOUS

## 2013-12-20 MED ORDER — METOPROLOL SUCCINATE ER 50 MG PO TB24
100.0000 mg | ORAL_TABLET | Freq: Every morning | ORAL | Status: DC
  Administered 2013-12-21 – 2013-12-22 (×2): 100 mg via ORAL
  Filled 2013-12-20 (×2): qty 2

## 2013-12-20 MED ORDER — HYDROMORPHONE HCL 1 MG/ML IJ SOLN
1.0000 mg | Freq: Once | INTRAMUSCULAR | Status: AC
  Administered 2013-12-20: 1 mg via INTRAVENOUS
  Filled 2013-12-20: qty 1

## 2013-12-20 MED ORDER — ENOXAPARIN SODIUM 40 MG/0.4ML ~~LOC~~ SOLN
40.0000 mg | SUBCUTANEOUS | Status: DC
  Administered 2013-12-20: 40 mg via SUBCUTANEOUS
  Filled 2013-12-20: qty 0.4

## 2013-12-20 MED ORDER — ONDANSETRON HCL 4 MG/2ML IJ SOLN
4.0000 mg | Freq: Four times a day (QID) | INTRAMUSCULAR | Status: DC | PRN
  Administered 2013-12-20 – 2013-12-22 (×4): 4 mg via INTRAVENOUS
  Filled 2013-12-20 (×4): qty 2

## 2013-12-20 MED ORDER — SUCRALFATE 1 G PO TABS
1.0000 g | ORAL_TABLET | Freq: Three times a day (TID) | ORAL | Status: DC
  Administered 2013-12-20 – 2013-12-22 (×7): 1 g via ORAL
  Filled 2013-12-20 (×7): qty 1

## 2013-12-20 MED ORDER — CLONAZEPAM 0.5 MG PO TABS
1.0000 mg | ORAL_TABLET | Freq: Two times a day (BID) | ORAL | Status: DC
  Administered 2013-12-20 – 2013-12-22 (×4): 1 mg via ORAL
  Filled 2013-12-20 (×4): qty 2

## 2013-12-20 MED ORDER — MORPHINE SULFATE ER 30 MG PO TBCR
30.0000 mg | EXTENDED_RELEASE_TABLET | Freq: Three times a day (TID) | ORAL | Status: DC
  Administered 2013-12-20 – 2013-12-22 (×6): 30 mg via ORAL
  Filled 2013-12-20 (×6): qty 1

## 2013-12-20 MED ORDER — ONDANSETRON HCL 4 MG/2ML IJ SOLN
4.0000 mg | INTRAMUSCULAR | Status: AC | PRN
  Administered 2013-12-20 (×2): 4 mg via INTRAVENOUS
  Filled 2013-12-20 (×2): qty 2

## 2013-12-20 MED ORDER — MORPHINE SULFATE 4 MG/ML IJ SOLN
4.0000 mg | INTRAMUSCULAR | Status: AC | PRN
  Administered 2013-12-20 (×2): 4 mg via INTRAVENOUS
  Filled 2013-12-20 (×2): qty 1

## 2013-12-20 MED ORDER — LISINOPRIL 10 MG PO TABS
10.0000 mg | ORAL_TABLET | Freq: Every day | ORAL | Status: DC
  Administered 2013-12-21 – 2013-12-22 (×2): 10 mg via ORAL
  Filled 2013-12-20 (×2): qty 1

## 2013-12-20 MED ORDER — PANTOPRAZOLE SODIUM 40 MG IV SOLR
40.0000 mg | Freq: Two times a day (BID) | INTRAVENOUS | Status: DC
  Administered 2013-12-20 – 2013-12-22 (×4): 40 mg via INTRAVENOUS
  Filled 2013-12-20 (×4): qty 40

## 2013-12-20 MED ORDER — IOHEXOL 300 MG/ML  SOLN
100.0000 mL | Freq: Once | INTRAMUSCULAR | Status: AC | PRN
  Administered 2013-12-20: 100 mL via INTRAVENOUS

## 2013-12-20 MED ORDER — SODIUM CHLORIDE 0.9 % IV SOLN
INTRAVENOUS | Status: DC
Start: 2013-12-20 — End: 2013-12-20
  Administered 2013-12-20: 16:00:00 via INTRAVENOUS

## 2013-12-20 MED ORDER — IOHEXOL 300 MG/ML  SOLN
50.0000 mL | Freq: Once | INTRAMUSCULAR | Status: AC | PRN
  Administered 2013-12-20: 50 mL via ORAL

## 2013-12-20 MED ORDER — FAMOTIDINE IN NACL 20-0.9 MG/50ML-% IV SOLN
20.0000 mg | Freq: Once | INTRAVENOUS | Status: AC
  Administered 2013-12-20: 20 mg via INTRAVENOUS
  Filled 2013-12-20: qty 50

## 2013-12-20 MED ORDER — FENTANYL CITRATE 0.05 MG/ML IJ SOLN
50.0000 ug | INTRAMUSCULAR | Status: AC | PRN
  Administered 2013-12-20 (×2): 50 ug via INTRAVENOUS
  Filled 2013-12-20 (×2): qty 2

## 2013-12-20 MED ORDER — ONDANSETRON HCL 4 MG/2ML IJ SOLN: 4.0000 mg | Freq: Three times a day (TID) | INTRAMUSCULAR | Status: AC | PRN

## 2013-12-20 MED ORDER — PROMETHAZINE HCL 25 MG/ML IJ SOLN
12.5000 mg | Freq: Once | INTRAMUSCULAR | Status: AC
  Administered 2013-12-20: 12.5 mg via INTRAVENOUS
  Filled 2013-12-20: qty 1

## 2013-12-20 MED ORDER — HYDROMORPHONE HCL 1 MG/ML IJ SOLN
1.0000 mg | INTRAMUSCULAR | Status: AC | PRN
  Administered 2013-12-20 – 2013-12-21 (×3): 1 mg via INTRAVENOUS
  Filled 2013-12-20 (×3): qty 1

## 2013-12-20 MED ORDER — MORPHINE SULFATE 2 MG/ML IJ SOLN
2.0000 mg | INTRAMUSCULAR | Status: DC | PRN
  Administered 2013-12-21 – 2013-12-22 (×4): 2 mg via INTRAVENOUS
  Filled 2013-12-20 (×4): qty 1

## 2013-12-20 NOTE — ED Notes (Signed)
Wife, who is at bedside is choleric, displeased with wait time to get into a room. Pt in room at 1555 seen then, pt has by seen and assessed by myself, IV started by myself and IV fluids started, also seen by Dr. Burnard Leigh, all within 15 minutes of patient being bought to room.

## 2013-12-20 NOTE — ED Notes (Signed)
Report called, report given to [facility] in Dept 300. All questions answered.
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
## 2013-12-20 NOTE — ED Notes (Signed)
Pt states he had increased pain since Wednesday. Nausea/Vomiting started then as well. Pain has increased since. Pt. States he has a history of pancreatitis and this presents similarly.

## 2013-12-20 NOTE — ED Notes (Signed)
Patient back from radiology, c/o of pain in abdominal area. Stated "last medication worked, but didn't last". Dr. Thurnell Garbe made aware.
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
## 2013-12-21 ENCOUNTER — Inpatient Hospital Stay (HOSPITAL_COMMUNITY): Payer: Medicare Other

## 2013-12-21 DIAGNOSIS — G894 Chronic pain syndrome: Secondary | ICD-10-CM

## 2013-12-21 DIAGNOSIS — K219 Gastro-esophageal reflux disease without esophagitis: Secondary | ICD-10-CM

## 2013-12-21 DIAGNOSIS — I10 Essential (primary) hypertension: Secondary | ICD-10-CM

## 2013-12-21 DIAGNOSIS — D649 Anemia, unspecified: Secondary | ICD-10-CM

## 2013-12-21 DIAGNOSIS — R112 Nausea with vomiting, unspecified: Secondary | ICD-10-CM

## 2013-12-21 DIAGNOSIS — R197 Diarrhea, unspecified: Secondary | ICD-10-CM

## 2013-12-21 DIAGNOSIS — F3289 Other specified depressive episodes: Secondary | ICD-10-CM

## 2013-12-21 DIAGNOSIS — R1013 Epigastric pain: Secondary | ICD-10-CM

## 2013-12-21 DIAGNOSIS — F329 Major depressive disorder, single episode, unspecified: Secondary | ICD-10-CM

## 2013-12-21 LAB — COMPREHENSIVE METABOLIC PANEL WITH GFR
ALT: 15 U/L (ref 0–53)
AST: 13 U/L (ref 0–37)
Albumin: 2.9 g/dL — ABNORMAL LOW (ref 3.5–5.2)
Alkaline Phosphatase: 55 U/L (ref 39–117)
Anion gap: 9 (ref 5–15)
BUN: 11 mg/dL (ref 6–23)
CO2: 27 meq/L (ref 19–32)
Calcium: 8.2 mg/dL — ABNORMAL LOW (ref 8.4–10.5)
Chloride: 104 meq/L (ref 96–112)
Creatinine, Ser: 1.17 mg/dL (ref 0.50–1.35)
GFR calc Af Amer: 76 mL/min — ABNORMAL LOW
GFR calc non Af Amer: 66 mL/min — ABNORMAL LOW
Glucose, Bld: 90 mg/dL (ref 70–99)
Potassium: 3.9 meq/L (ref 3.7–5.3)
Sodium: 140 meq/L (ref 137–147)
Total Bilirubin: 0.7 mg/dL (ref 0.3–1.2)
Total Protein: 6 g/dL (ref 6.0–8.3)

## 2013-12-21 LAB — URINALYSIS, ROUTINE W REFLEX MICROSCOPIC
Bilirubin Urine: NEGATIVE
Glucose, UA: NEGATIVE mg/dL
Hgb urine dipstick: NEGATIVE
Ketones, ur: NEGATIVE mg/dL
LEUKOCYTES UA: NEGATIVE
NITRITE: NEGATIVE
PH: 6 (ref 5.0–8.0)
Protein, ur: NEGATIVE mg/dL
Specific Gravity, Urine: 1.015 (ref 1.005–1.030)
Urobilinogen, UA: 0.2 mg/dL (ref 0.0–1.0)

## 2013-12-21 LAB — CBC
HCT: 37.2 % — ABNORMAL LOW (ref 39.0–52.0)
Hemoglobin: 12.8 g/dL — ABNORMAL LOW (ref 13.0–17.0)
MCH: 32.7 pg (ref 26.0–34.0)
MCHC: 34.4 g/dL (ref 30.0–36.0)
MCV: 94.9 fL (ref 78.0–100.0)
Platelets: 202 K/uL (ref 150–400)
RBC: 3.92 MIL/uL — ABNORMAL LOW (ref 4.22–5.81)
RDW: 12.9 % (ref 11.5–15.5)
WBC: 7.6 K/uL (ref 4.0–10.5)

## 2013-12-21 NOTE — Progress Notes (Signed)
[facility]                                                                              Patient Demographics  Richard Levine, is a 63 y.o. male, DOB - [birth year], ENI:[id number]  Admit date - 12/20/2013   Admitting Physician Oswald Hillock, MD  Outpatient Primary MD for the patient is Tammi Sou, MD  LOS - 1   Chief Complaint  Patient presents with  . Abdominal Pain      HPI on 12/20/2013 63 year old with past medical history of hypertension, anxiety depression, that presented to the emergency department with complaints of abdominal pain, nausea, vomiting, diarrhea which started 3 days ago. Patient has had episodes like this back in 2013 point he underwent endoscopy which did not show any significant changes. Patient was term Protonix we'll carefully at that time. Patient does not take Carafate daily. Patient does state he has compliant with his Protonix. Patient states he's had 2 loose bowel movements on the day of admission. He did have epigastric pain and rates as a 5/10 in intensity, does not radiate. Patient's lipase was noted to be 74 upon admission. CT of the abdomen and pelvis was done which did not show any significant abnormalities. Patient does take opiates for his chronic back pain as well as osteoporosis. At admission, patient denies any chest pain, shortness of breath, fever, dysuria, urgency or urinary frequency.  Assessment & Plan   Abdominal pain, with nausea, vomiting, diarrhea -Patient continues to have some epigastric pain  -Possibly secondary to stress vs narcotic use -Patient had EGD in 2013 which was unremarkable -Lipase 74, LFTs normal -Gastroenterology consulted and appreciated  -Continue pain medications as needed  -GI pathogen panel pending  Chronic pain syndrome -continue MS Contin and morphine PRN  Hypertension -Continue metoprolol, lisinopril -HCTZ currently held  Depression/Anxiety -Possibly situational, due to the loss of his son and  family problems -Advised patient to speak with a counselor -Continue klonopin  Normacytic Anemia -Likely dilutional -Will continue to monitor CBC  Code Status: Full  Family Communication: Wife at bedside  Disposition Plan: Admitted  Time Spent in minutes   30 minutes  Procedures  None  Consults   Gastroenterology, Dr. Laural Golden  DVT Prophylaxis  lovenox  Lab Results  Component Value Date   PLT 202 12/21/2013    Medications  Scheduled Meds: . clonazePAM  1 mg Oral BID  . enoxaparin (LOVENOX) injection  40 mg Subcutaneous Q24H  . lisinopril  10 mg Oral Daily  . metoprolol succinate  100 mg Oral q morning - 10a  . morphine  30 mg Oral Q8H  . pantoprazole (PROTONIX) IV  40 mg Intravenous Q12H  . sucralfate  1 g Oral TID WC & HS   Continuous Infusions:  PRN Meds:.morphine injection, ondansetron (ZOFRAN) IV, ondansetron  Antibiotics    Anti-infectives   None      Subjective:   Llana Aliment seen and examined today.  Patient states he stomach pain has improved slightly.  He continues to have pain in the epigastric region.  He denies dizziness, chest pain, SOB.   Objective:   Filed Vitals:   12/20/13 1900 12/20/13  1930 12/20/13 2040 12/21/13 0616  BP: 118/71 114/67 128/75 115/87  Pulse: 88 81 84 71  Temp:   98.4 F (36.9 C) 98.1 F (36.7 C)  TempSrc:   Oral Oral  Resp: 10 13 18 18   Height:   5\' 10"  (1.778 m)   Weight:   113.399 kg (250 lb)   SpO2: 96% 97% 98% 97%    Wt Readings from Last 3 Encounters:  12/20/13 113.399 kg (250 lb)  10/24/13 117.482 kg (259 lb)  09/16/13 113.399 kg (250 lb)     Intake/Output Summary (Last 24 hours) at 12/21/13 1413 Last data filed at 12/21/13 0800  Gross per 24 hour  Intake    880 ml  Output      0 ml  Net    880 ml    Exam  General: Well developed, well nourished, NAD, appears stated age  HEENT: NCAT, PERRLA, EOMI, Anicteic Sclera, mucous membranes moist.   Cardiovascular: S1 S2 auscultated, no rubs,  murmurs or gallops. Regular rate and rhythm.  Respiratory: Clear to auscultation bilaterally with equal chest rise  Abdomen: Soft, nontender, nondistended, + bowel sounds  Extremities: warm dry without cyanosis clubbing or edema  Neuro: AAOx3, cranial nerves grossly intact. Strength 5/5 in patient's upper and lower extremities bilaterally  Psych: Appropriate mood, anxious affect  Data Review   Micro Results No results found for this or any previous visit (from the past 240 hour(s)).  Radiology Reports US Abdomen Complete  12/21/2013   CLINICAL DATA:  Pain.  EXAM: ULTRASOUND ABDOMEN COMPLETE  COMPARISON:  CT 12/20/2013.  FINDINGS: Gallbladder:  No gallstones or wall thickening visualized. No sonographic Murphy sign noted.  Common bile duct:  Diameter: 3.4 mm  Liver:  Liver is echogenic suggesting fatty infiltration.  IVC:  No abnormality visualized.  Pancreas:  Not visualized.  Spleen:  Size and appearance within normal limits.  Right Kidney:  Length: 12.6 cm. Echogenicity within normal limits. No mass or hydronephrosis visualized. Multiple tiny calcific densities noted suggesting caliceal stones.  Left Kidney:  Length: 13.3 cm. Echogenicity within normal limits. No significant mass or hydronephrosis visualized. 1.9 cm lower pole simple cyst.  Abdominal aorta:  No aneurysm visualized.  Other findings:  None.  IMPRESSION: 1. Fatty infiltration of liver. 2. No acute abnormality identified. No evidence of gallstones. The pancreas was poorly visualized due to overlying bowel gas. 3. Nonobstructing right nephrolithiasis.   Electronically Signed   By: [place]   On: 12/21/2013 12:22   Ct Abdomen Pelvis W Contrast  12/20/2013   CLINICAL DATA:  Abdominal pain.  EXAM: CT ABDOMEN AND PELVIS WITH CONTRAST  TECHNIQUE: Multidetector CT imaging of the abdomen and pelvis was performed using the standard protocol following bolus administration of intravenous contrast.  CONTRAST:  5mL OMNIPAQUE IOHEXOL  300 MG/ML SOLN, 175mL OMNIPAQUE IOHEXOL 300 MG/ML SOLN  COMPARISON:  CT scan of December 23, 2011.  FINDINGS: Postsurgical and degenerative changes are noted in the lower lumbar spine. Visualized lung bases appear grossly normal.  No gallstones are noted. The liver, spleen and pancreas appear normal. Adrenal glands appear normal. Stable small bilateral renal cysts are noted. No hydronephrosis or renal obstruction is noted. No renal or ureteral calculi are noted. The appendix appears normal. There is no evidence of bowel obstruction. Mild atherosclerotic calcifications of abdominal aorta are noted without aneurysm formation. No abnormal fluid collection is noted. Urinary bladder appears normal. Diverticulosis of descending colon is noted without inflammation. No significant adenopathy is noted.  IMPRESSION: Stable small bilateral renal cysts.  Diverticulosis of descending colon is noted without inflammation.  No acute abnormality seen in the abdomen or pelvis.   Electronically Signed   By: Sabino Dick M.D.   On: 12/20/2013 19:00    CBC  Recent Labs Lab 12/20/13 1450 12/21/13 0547  WBC 7.7 7.6  HGB 15.8 12.8*  HCT 44.3 37.2*  PLT 246 202  MCV 92.5 94.9  MCH 33.0 32.7  MCHC 35.7 34.4  RDW 12.6 12.9  LYMPHSABS 1.4  --   MONOABS 0.6  --   EOSABS 0.0  --   BASOSABS 0.0  --     Chemistries   Recent Labs Lab 12/20/13 1450 12/21/13 0547  NA 136* 140  K 3.6* 3.9  CL 97 104  CO2 26 27  GLUCOSE 198* 90  BUN 13 11  CREATININE 1.23 1.17  CALCIUM 9.5 8.2*  AST 17 13  ALT 21 15  ALKPHOS 74 55  BILITOT 0.8 0.7   ------------------------------------------------------------------------------------------------------------------ estimated creatinine clearance is 83.7 ml/min (by C-G formula based on Cr of 1.17). ------------------------------------------------------------------------------------------------------------------ No results found for this basename: HGBA1C,  in the last 72  hours ------------------------------------------------------------------------------------------------------------------ No results found for this basename: CHOL, HDL, LDLCALC, TRIG, CHOLHDL, LDLDIRECT,  in the last 72 hours ------------------------------------------------------------------------------------------------------------------ No results found for this basename: TSH, T4TOTAL, FREET3, T3FREE, THYROIDAB,  in the last 72 hours ------------------------------------------------------------------------------------------------------------------ No results found for this basename: VITAMINB12, FOLATE, FERRITIN, TIBC, IRON, RETICCTPCT,  in the last 72 hours  Coagulation profile No results found for this basename: INR, PROTIME,  in the last 168 hours  No results found for this basename: DDIMER,  in the last 72 hours  Cardiac Enzymes No results found for this basename: CK, CKMB, TROPONINI, MYOGLOBIN,  in the last 168 hours ------------------------------------------------------------------------------------------------------------------ No components found with this basename: POCBNP,     Sequoyah Ramone D.O. on 12/21/2013 at 2:13 PM  Between 7am to 7pm - Pager - [phone]  After 7pm go to [email or web address] - password [email or web address]  And look for the night coverage person covering for me after hours  [facility] Office  [phone]
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
## 2013-12-21 NOTE — Progress Notes (Signed)
Utilization review Completed Morrigan Wickens RN BSN   

## 2013-12-21 NOTE — Progress Notes (Signed)
Nutrition Brief Note  Patient identified on the Malnutrition Screening Tool (MST) Report  Wt Readings from Last 15 Encounters:  12/20/13 250 lb (113.399 kg)  10/24/13 259 lb (117.482 kg)  09/16/13 250 lb (113.399 kg)  07/21/13 252 lb (114.306 kg)  04/22/13 250 lb (113.399 kg)  01/22/13 255 lb (115.667 kg)  10/24/12 251 lb (113.853 kg)  08/26/12 246 lb 8 oz (111.812 kg)  06/27/12 237 lb (107.502 kg)  06/06/12 235 lb (106.595 kg)  06/06/12 235 lb (106.595 kg)  05/21/12 243 lb 6.4 oz (110.406 kg)  04/29/12 239 lb (108.41 kg)  03/28/12 237 lb (107.502 kg)  01/26/12 236 lb (107.049 kg)    Body mass index is 35.87 kg/(m^2). Patient meets criteria for obesity class II based on current BMI.   Labs and medications reviewed. No nutrition interventions warranted at this time.  Colman Cater MS,RD,CSG,LDN Office: [phone] Pager: [phone]

## 2013-12-22 ENCOUNTER — Telehealth: Payer: Self-pay | Admitting: Internal Medicine

## 2013-12-22 DIAGNOSIS — E782 Mixed hyperlipidemia: Secondary | ICD-10-CM

## 2013-12-22 DIAGNOSIS — R109 Unspecified abdominal pain: Secondary | ICD-10-CM

## 2013-12-22 LAB — CBC
HCT: 37.6 % — ABNORMAL LOW (ref 39.0–52.0)
HEMOGLOBIN: 12.7 g/dL — AB (ref 13.0–17.0)
MCH: 32.3 pg (ref 26.0–34.0)
MCHC: 33.8 g/dL (ref 30.0–36.0)
MCV: 95.7 fL (ref 78.0–100.0)
PLATELETS: 196 10*3/uL (ref 150–400)
RBC: 3.93 MIL/uL — ABNORMAL LOW (ref 4.22–5.81)
RDW: 12.6 % (ref 11.5–15.5)
WBC: 7.3 10*3/uL (ref 4.0–10.5)

## 2013-12-22 LAB — LIPASE, BLOOD: Lipase: 49 U/L (ref 11–59)

## 2013-12-22 LAB — BASIC METABOLIC PANEL
ANION GAP: 9 (ref 5–15)
BUN: 8 mg/dL (ref 6–23)
CO2: 29 mEq/L (ref 19–32)
Calcium: 8.2 mg/dL — ABNORMAL LOW (ref 8.4–10.5)
Chloride: 104 mEq/L (ref 96–112)
Creatinine, Ser: 1.07 mg/dL (ref 0.50–1.35)
GFR calc Af Amer: 85 mL/min — ABNORMAL LOW (ref >90)
GFR, EST NON AFRICAN AMERICAN: 73 mL/min — AB (ref >90)
GLUCOSE: 93 mg/dL (ref 70–99)
POTASSIUM: 4.5 meq/L (ref 3.7–5.3)
SODIUM: 142 meq/L (ref 137–147)

## 2013-12-22 LAB — AMYLASE: AMYLASE: 79 U/L (ref 0–105)

## 2013-12-22 MED ORDER — PROMETHAZINE HCL 12.5 MG PO TABS: 12.5000 mg | ORAL_TABLET | Freq: Four times a day (QID) | ORAL | Status: DC | PRN

## 2013-12-22 NOTE — Clinical Social Work Psychosocial (Signed)
Clinical Social Work Department BRIEF PSYCHOSOCIAL ASSESSMENT 12/22/2013  Patient:  Richard Levine, Richard Levine     Account Number:  [id number]     Admit date:  12/20/2013  Clinical Social Worker:  Wyatt Haste  Date/Time:  12/22/2013 11:01 AM  Referred by:  Physician  Date Referred:  12/22/2013 Referred for  Other - See comment   Other Referral:   outpatient counseling   Interview type:  Patient Other interview type:    PSYCHOSOCIAL DATA Living Status:  FAMILY Admitted from facility:   Levine of care:   Primary support name:  Richard Levine Primary support relationship to patient:  SPOUSE Degree of support available:   supportive per pt    CURRENT CONCERNS Current Concerns  Other - See comment   Other Concerns:    SOCIAL WORK ASSESSMENT / PLAN CSW met with pt at bedside following MD referral for outpatient counseling resources. Pt alert and oriented and reports he is ready to go home. Admitted with abdominal pain over weekend. Pt reports he lives with his wife and teenage step son and is also raising his 61 year old granddaughter. He said his son committed suicide four years ago at the age of 17. Pt expressed how difficult the last few years have been dealing with grief and having discussions with his granddaughter. CSW provided support. He states he has been to grief counseling in the past and has also signed up for a support group. Pt indicates his best support is his wife and daughter. He is considering counseling as he feels it would be good to go to someone who does not know him or his son.   Assessment/plan status:  Referral to [employer] Other assessment/ plan:   Information/referral to community resources:   Counseling list    PATIENT'S/FAMILY'S RESPONSE TO PLAN OF CARE: Pt appreciative of visit and referrals. No other needs reported.       [name], [place]

## 2013-12-22 NOTE — Telephone Encounter (Signed)
Pt is aware of OV for this Thursday at 1030 with AS

## 2013-12-22 NOTE — Progress Notes (Signed)
Subjective: No further diarrhea. Abdominal pain improved. Nausea improved. Felt mild nausea overnight with pain but settled. Ate a roll and mashed potatoes last night. States son committed suicide in 2013 when episodes started.   Objective: Vital signs in last 24 hours: Temp:  [98.1 F (36.7 C)] 98.1 F (36.7 C) (09/28 0540) Pulse Rate:  [62-86] 62 (09/28 0540) Resp:  [18-20] 20 (09/28 0540) BP: (97-128)/(50-84) 128/70 mmHg (09/28 0540) SpO2:  [95 %-97 %] 97 % (09/28 0540) Last BM Date: 12/20/13 General:   Alert and oriented, pleasant Head:  Normocephalic and atraumatic. Eyes:  No icterus, sclera clear. Conjuctiva pink.  Abdomen:  Bowel sounds present, soft, non-tender, non-distended.  Extremities:  Without clubbing or edema. Neurologic:  Alert and  oriented x4;  grossly normal neurologically. Psych:  Alert and cooperative. Normal mood and affect.  Intake/Output from previous day: 09/27 0701 - 09/28 0700 In: 480 [P.O.:480] Out: 800 [Urine:800] Intake/Output this shift:    Lab Results:  Recent Labs  12/20/13 1450 12/21/13 0547 12/22/13 0600  WBC 7.7 7.6 7.3  HGB 15.8 12.8* 12.7*  HCT 44.3 37.2* 37.6*  PLT 246 202 196   BMET  Recent Labs  12/20/13 1450 12/21/13 0547 12/22/13 0600  NA 136* 140 142  K 3.6* 3.9 4.5  CL 97 104 104  CO2 26 27 29   GLUCOSE 198* 90 93  BUN 13 11 8   CREATININE 1.23 1.17 1.07  CALCIUM 9.5 8.2* 8.2*   LFT  Recent Labs  12/20/13 1450 12/21/13 0547  PROT 8.0 6.0  ALBUMIN 3.8 2.9*  AST 17 13  ALT 21 15  ALKPHOS 74 55  BILITOT 0.8 0.7     Studies/Results: US Abdomen Complete  12/21/2013   CLINICAL DATA:  Pain.  EXAM: ULTRASOUND ABDOMEN COMPLETE  COMPARISON:  CT 12/20/2013.  FINDINGS: Gallbladder:  No gallstones or wall thickening visualized. No sonographic Murphy sign noted.  Common bile duct:  Diameter: 3.4 mm  Liver:  Liver is echogenic suggesting fatty infiltration.  IVC:  No abnormality visualized.  Pancreas:  Not  visualized.  Spleen:  Size and appearance within normal limits.  Right Kidney:  Length: 12.6 cm. Echogenicity within normal limits. No mass or hydronephrosis visualized. Multiple tiny calcific densities noted suggesting caliceal stones.  Left Kidney:  Length: 13.3 cm. Echogenicity within normal limits. No significant mass or hydronephrosis visualized. 1.9 cm lower pole simple cyst.  Abdominal aorta:  No aneurysm visualized.  Other findings:  None.  IMPRESSION: 1. Fatty infiltration of liver. 2. No acute abnormality identified. No evidence of gallstones. The pancreas was poorly visualized due to overlying bowel gas. 3. Nonobstructing right nephrolithiasis.   Electronically Signed   By: [place]   On: 12/21/2013 12:22   Ct Abdomen Pelvis W Contrast  12/20/2013   CLINICAL DATA:  Abdominal pain.  EXAM: CT ABDOMEN AND PELVIS WITH CONTRAST  TECHNIQUE: Multidetector CT imaging of the abdomen and pelvis was performed using the standard protocol following bolus administration of intravenous contrast.  CONTRAST:  42mL OMNIPAQUE IOHEXOL 300 MG/ML SOLN, 157mL OMNIPAQUE IOHEXOL 300 MG/ML SOLN  COMPARISON:  CT scan of December 23, 2011.  FINDINGS: Postsurgical and degenerative changes are noted in the lower lumbar spine. Visualized lung bases appear grossly normal.  No gallstones are noted. The liver, spleen and pancreas appear normal. Adrenal glands appear normal. Stable small bilateral renal cysts are noted. No hydronephrosis or renal obstruction is noted. No renal or ureteral calculi are noted. The appendix appears normal.  There is no evidence of bowel obstruction. Mild atherosclerotic calcifications of abdominal aorta are noted without aneurysm formation. No abnormal fluid collection is noted. Urinary bladder appears normal. Diverticulosis of descending colon is noted without inflammation. No significant adenopathy is noted.  IMPRESSION: Stable small bilateral renal cysts.  Diverticulosis of descending colon is  noted without inflammation.  No acute abnormality seen in the abdomen or pelvis.   Electronically Signed   By: Sabino Dick M.D.   On: 12/20/2013 19:00    Assessment: 63 year old male admitted with N/V/D and epigastric pain without evidence of overt GI bleeding with differentials to include gastroenteritis, PUD, or gastroparesis. Patient endorses significant amount of stress that seems to correlate with episodes. Gallbladder remains in situ, with ultrasound showing fatty liver and no gallstones. Clinically improved since admission. Doubt EGD would shed much light at this point; however, would recommend close interval follow-up as an outpatient to discuss elective EGD. If EGD negative, could consider HIDA scan.   As of note, history of elevated lipase in 2013 with negative ultrasound and CT, normal EUS. Pancreas normal on CT this admission with only mild, non-specific elevation of lipase at 74; no concern for pancreatitis.   Plan: Continue PPI Close outpatient follow-up for further evaluation of need for EGD Appropriate for discharge home from a GI standpoint.   Orvil Feil, ANP-BC [facility]    LOS: 2 days    12/22/2013, 8:07 AM

## 2013-12-22 NOTE — Progress Notes (Signed)
IV removed. Discharge instructions reviewed with patient. Understanding verbalized. Awaiting ride for discharge home 

## 2013-12-22 NOTE — Discharge Summary (Signed)
Physician Discharge Summary  Richard Levine:[id number] DOB: 05-29-1950 DOA: 12/20/2013  PCP: Tammi Sou, MD  Admit date: 12/20/2013 Discharge date: 12/22/2013  Time spent: 45 minutes  Recommendations for Outpatient Follow-up:  Patient will be discharged home. He is to follow up with his primary care physician within one week of discharge. Patient should also follow up with Dr. Gala Romney, gastroenterologist, within 1-2 weeks of discharge.  Patient was also advised to seek counseling.  He should continue his medications as prescribed. He should follow a heart healthy diet.  Patient may resume normal physical activity as tolerated.  Discharge Diagnoses:  Abdominal pain with nausea, vomiting, and diarrhea  Chronic pain syndrome Hypertension Depression/Anxiety Normocytic Anemia  Discharge Condition: Stable  Diet recommendation: Heart healthy  Filed Weights   12/20/13 1432 12/20/13 2040  Weight: 113.399 kg (250 lb) 113.399 kg (250 lb)    History of present illness:  on 12/20/2013  63 year old with past medical history of hypertension, anxiety depression, that presented to the emergency department with complaints of abdominal pain, nausea, vomiting, diarrhea which started 3 days ago. Patient has had episodes like this back in 2013 point he underwent endoscopy which did not show any significant changes. Patient was term Protonix we'll carefully at that time. Patient does not take Carafate daily. Patient does state he has compliant with his Protonix. Patient states he's had 2 loose bowel movements on the day of admission. He did have epigastric pain and rates as a 5/10 in intensity, does not radiate. Patient's lipase was noted to be 74 upon admission. CT of the abdomen and pelvis was done which did not show any significant abnormalities. Patient does take opiates for his chronic back pain as well as osteoporosis. At admission, patient denies any chest pain, shortness of breath, fever, dysuria,  urgency or urinary frequency.  Hospital Course:  Abdominal pain, with nausea, vomiting, diarrhea  -Patient continues to have some epigastric pain  -Possibly secondary to stress vs narcotic use  -Patient had EGD in 2013 which was unremarkable  -Lipase 74, LFTs normal  -Gastroenterology consulted and appreciated and recommended outpatient close followup  -Continue pain medications as needed   Chronic pain syndrome  -continue MS Contin  Hypertension  -Continue metoprolol, lisinopril  -HCTZ was held, but may restart upon discharge  Depression/Anxiety  -Possibly situational, due to the loss of his son and family problems  -Advised patient to speak with a counselor  -Continue klonopin   Normocytic Anemia  -Likely dilutional  -Hb has remained stable   Procedures:  None  Consultations:  Gastroenterology  Discharge Exam: Filed Vitals:   12/22/13 0741  BP: 143/82  Pulse: 61  Temp:   Resp:    Exam  General: Well developed, well nourished, NAD, appears stated age  HEENT: NCAT, mucous membranes moist.  Cardiovascular: S1 S2 auscultated, no rubs, murmurs or gallops. Regular rate and rhythm.  Respiratory: Clear to auscultation bilaterally with equal chest rise  Abdomen: Soft, nontender, nondistended, + bowel sounds  Extremities: warm dry without cyanosis clubbing or edema  Neuro: AAOx3, no focal deficits Psych: Appropriate mood and affect  Discharge Instructions     Discharge Instructions   Discharge instructions    Complete by:  As directed   Patient will be discharged home. He is to follow up with his primary care physician within one week of discharge. Patient should also follow up with Dr. Gala Romney, gastroenterologist, within 1-2 weeks of discharge.  Patient was also advised to seek counseling.  He should continue his medications as prescribed. He should follow a heart healthy diet.  Patient may resume normal physical activity as tolerated.            Medication List          aspirin EC 81 MG tablet  Take 81 mg by mouth every morning.     atorvastatin 20 MG tablet  Commonly known as:  LIPITOR  Take 1 tablet (20 mg total) by mouth daily.     clonazePAM 1 MG tablet  Commonly known as:  KLONOPIN  Take 1 mg by mouth 2 (two) times daily.     FIBER PO  Take 2 tablets by mouth daily.     hydrochlorothiazide 25 MG tablet  Commonly known as:  HYDRODIURIL  Take 25 mg by mouth daily.     lisinopril 10 MG tablet  Commonly known as:  PRINIVIL,ZESTRIL  Take 1 tablet (10 mg total) by mouth daily.     metoprolol succinate 100 MG 24 hr tablet  Commonly known as:  TOPROL-XL  Take 100 mg by mouth every morning. Take with or immediately following a meal.     morphine 30 MG 12 hr tablet  Commonly known as:  MS CONTIN  Take 1 tablet (30 mg total) by mouth every 8 (eight) hours.     multivitamin with minerals Tabs tablet  Take 1 tablet by mouth daily.     Oxycodone HCl 10 MG Tabs  Take 1 tablet (10 mg total) by mouth 3 (three) times daily as needed (for pain).     pantoprazole 40 MG tablet  Commonly known as:  PROTONIX  Take 40 mg by mouth daily.     promethazine 12.5 MG tablet  Commonly known as:  PHENERGAN  Take 1 tablet (12.5 mg total) by mouth every 6 (six) hours as needed for nausea.     sucralfate 1 G tablet  Commonly known as:  CARAFATE  Take 1 g by mouth 2 (two) times daily as needed (Stomach Pain).       No Known Allergies Follow-up Information   Follow up with MCGOWEN,PHILIP H, MD. Schedule an appointment as soon as possible for a visit in 1 week. [facility] followup)    Specialty:  Family Medicine   Contact information:   [address] [place] [address] [phone]       Follow up with Manus Rudd, MD. Schedule an appointment as soon as possible for a visit in 1 week. [facility] followup)    Specialty:  Gastroenterology   Contact information:   [address] [place] [address] [phone]        The  results of significant diagnostics from this hospitalization (including imaging, microbiology, ancillary and laboratory) are listed below for reference.    Significant Diagnostic Studies: US Abdomen Complete  12/21/2013   CLINICAL DATA:  Pain.  EXAM: ULTRASOUND ABDOMEN COMPLETE  COMPARISON:  CT 12/20/2013.  FINDINGS: Gallbladder:  No gallstones or wall thickening visualized. No sonographic Murphy sign noted.  Common bile duct:  Diameter: 3.4 mm  Liver:  Liver is echogenic suggesting fatty infiltration.  IVC:  No abnormality visualized.  Pancreas:  Not visualized.  Spleen:  Size and appearance within normal limits.  Right Kidney:  Length: 12.6 cm. Echogenicity within normal limits. No mass or hydronephrosis visualized. Multiple tiny calcific densities noted suggesting caliceal stones.  Left Kidney:  Length: 13.3 cm. Echogenicity within normal limits. No significant mass or hydronephrosis visualized. 1.9 cm  lower pole simple cyst.  Abdominal aorta:  No aneurysm visualized.  Other findings:  None.  IMPRESSION: 1. Fatty infiltration of liver. 2. No acute abnormality identified. No evidence of gallstones. The pancreas was poorly visualized due to overlying bowel gas. 3. Nonobstructing right nephrolithiasis.   Electronically Signed   By: Cary   On: 12/21/2013 12:22   Ct Abdomen Pelvis W Contrast  12/20/2013   CLINICAL DATA:  Abdominal pain.  EXAM: CT ABDOMEN AND PELVIS WITH CONTRAST  TECHNIQUE: Multidetector CT imaging of the abdomen and pelvis was performed using the standard protocol following bolus administration of intravenous contrast.  CONTRAST:  73mL OMNIPAQUE IOHEXOL 300 MG/ML SOLN, 159mL OMNIPAQUE IOHEXOL 300 MG/ML SOLN  COMPARISON:  CT scan of December 23, 2011.  FINDINGS: Postsurgical and degenerative changes are noted in the lower lumbar spine. Visualized lung bases appear grossly normal.  No gallstones are noted. The liver, spleen and pancreas appear normal. Adrenal glands appear normal.  Stable small bilateral renal cysts are noted. No hydronephrosis or renal obstruction is noted. No renal or ureteral calculi are noted. The appendix appears normal. There is no evidence of bowel obstruction. Mild atherosclerotic calcifications of abdominal aorta are noted without aneurysm formation. No abnormal fluid collection is noted. Urinary bladder appears normal. Diverticulosis of descending colon is noted without inflammation. No significant adenopathy is noted.  IMPRESSION: Stable small bilateral renal cysts.  Diverticulosis of descending colon is noted without inflammation.  No acute abnormality seen in the abdomen or pelvis.   Electronically Signed   By: Sabino Dick M.D.   On: 12/20/2013 19:00    Microbiology: No results found for this or any previous visit (from the past 240 hour(s)).   Labs: Basic Metabolic Panel:  Recent Labs Lab 12/20/13 1450 12/21/13 0547 12/22/13 0600  NA 136* 140 142  K 3.6* 3.9 4.5  CL 97 104 104  CO2 26 27 29   GLUCOSE 198* 90 93  BUN 13 11 8   CREATININE 1.23 1.17 1.07  CALCIUM 9.5 8.2* 8.2*   Liver Function Tests:  Recent Labs Lab 12/20/13 1450 12/21/13 0547  AST 17 13  ALT 21 15  ALKPHOS 74 55  BILITOT 0.8 0.7  PROT 8.0 6.0  ALBUMIN 3.8 2.9*    Recent Labs Lab 12/20/13 1450 12/22/13 0600  LIPASE 74* 49  AMYLASE  --  79   No results found for this basename: AMMONIA,  in the last 168 hours CBC:  Recent Labs Lab 12/20/13 1450 12/21/13 0547 12/22/13 0600  WBC 7.7 7.6 7.3  NEUTROABS 5.7  --   --   HGB 15.8 12.8* 12.7*  HCT 44.3 37.2* 37.6*  MCV 92.5 94.9 95.7  PLT 246 202 196   Cardiac Enzymes: No results found for this basename: CKTOTAL, CKMB, CKMBINDEX, TROPONINI,  in the last 168 hours BNP: BNP (last 3 results) No results found for this basename: PROBNP,  in the last 8760 hours CBG: No results found for this basename: GLUCAP,  in the last 168 hours     Signed:  Cristal Ford  [facility] 12/22/2013,  11:39 AM

## 2013-12-22 NOTE — Telephone Encounter (Signed)
I did not see him today. Ask AS if this would be too soon.

## 2013-12-22 NOTE — Telephone Encounter (Signed)
This is fine. I can see him and discuss any further work-up that may be needed.

## 2013-12-22 NOTE — Telephone Encounter (Signed)
[facility] called to make a HOS follow up per RMR ASAP. I have put patient in for this Thursday 10/1 at 1030 with AS will this be OK or should I schedule farther out to next available (end of October). Please advise and I will call patient to let him know what to do.

## 2013-12-22 NOTE — Discharge Instructions (Signed)
Abdominal Pain Many things can cause abdominal pain. Usually, abdominal pain is not caused by a disease and will improve without treatment. It can often be observed and treated at home. Your health care provider will do a physical exam and possibly order blood tests and X-rays to help determine the seriousness of your pain. However, in many cases, more time must pass before a clear cause of the pain can be found. Before that point, your health care provider may not know if you need more testing or further treatment. HOME CARE INSTRUCTIONS  Monitor your abdominal pain for any changes. The following actions may help to alleviate any discomfort you are experiencing:  Only take over-the-counter or prescription medicines as directed by your health care provider.  Do not take laxatives unless directed to do so by your health care provider.  Try a clear liquid diet (broth, tea, or water) as directed by your health care provider. Slowly move to a bland diet as tolerated. SEEK MEDICAL CARE IF:  You have unexplained abdominal pain.  You have abdominal pain associated with nausea or diarrhea.  You have pain when you urinate or have a bowel movement.  You experience abdominal pain that wakes you in the night.  You have abdominal pain that is worsened or improved by eating food.  You have abdominal pain that is worsened with eating fatty foods.  You have a fever. SEEK IMMEDIATE MEDICAL CARE IF:   Your pain does not go away within 2 hours.  You keep throwing up (vomiting).  Your pain is felt only in portions of the abdomen, such as the right side or the left lower portion of the abdomen.  You pass bloody or black tarry stools. MAKE SURE YOU:  Understand these instructions.   Will watch your condition.   Will get help right away if you are not doing well or get worse.  Document Released: 12/21/2004 Document Revised: 03/18/2013 Document Reviewed: 11/20/2012 [facility] Patient Information  2015 [place], [place]. This information is not intended to replace advice given to you by your health care provider. Make sure you discuss any questions you have with your health care provider. Stress and Stress Management Stress is a normal reaction to life events. It is what you feel when life demands more than you are used to or more than you can handle. Some stress can be useful. For example, the stress reaction can help you catch the last bus of the day, study for a test, or meet a deadline at work. But stress that occurs too often or for too long can cause problems. It can affect your emotional health and interfere with relationships and normal daily activities. Too much stress can weaken your immune system and increase your risk for physical illness. If you already have a medical problem, stress can make it worse. CAUSES  All sorts of life events may cause stress. An event that causes stress for one person may not be stressful for another person. Major life events commonly cause stress. These may be positive or negative. Examples include losing your job, moving into a new home, getting married, having a baby, or losing a loved one. Less obvious life events may also cause stress, especially if they occur day after day or in combination. Examples include working long hours, driving in traffic, caring for children, being in debt, or being in a difficult relationship. SIGNS AND SYMPTOMS Stress may cause emotional symptoms including, the following:  Anxiety. This is feeling worried, afraid, on  edge, overwhelmed, or out of control.  Anger. This is feeling irritated or impatient.  Depression. This is feeling sad, down, helpless, or guilty.  Difficulty focusing, remembering, or making decisions. Stress may cause physical symptoms, including the following:   Aches and pains. These may affect your head, neck, back, stomach, or other areas of your body.  Tight muscles or clenched jaw.  Low energy or  trouble sleeping. Stress may cause unhealthy behaviors, including the following:   Eating to feel better (overeating) or skipping meals.  Sleeping too little, too much, or both.  Working too much or putting off tasks (procrastination).  Smoking, drinking alcohol, or using drugs to feel better. DIAGNOSIS  Stress is diagnosed through an assessment by your health care provider. Your health care provider will ask questions about your symptoms and any stressful life events.Your health care provider will also ask about your medical history and may order blood tests or other tests. Certain medical conditions and medicine can cause physical symptoms similar to stress. Mental illness can cause emotional symptoms and unhealthy behaviors similar to stress. Your health care provider may refer you to a mental health professional for further evaluation.  TREATMENT  Stress management is the recommended treatment for stress.The goals of stress management are reducing stressful life events and coping with stress in healthy ways.  Techniques for reducing stressful life events include the following:  Stress identification. Self-monitor for stress and identify what causes stress for you. These skills may help you to avoid some stressful events.  Time management. Set your priorities, keep a calendar of events, and learn to say "no." These tools can help you avoid making too many commitments. Techniques for coping with stress include the following:  Rethinking the problem. Try to think realistically about stressful events rather than ignoring them or overreacting. Try to find the positives in a stressful situation rather than focusing on the negatives.  Exercise. Physical exercise can release both physical and emotional tension. The key is to find a form of exercise you enjoy and do it regularly.  Relaxation techniques. These relax the body and mind. Examples include yoga, meditation, tai chi, biofeedback,  deep breathing, progressive muscle relaxation, listening to music, being out in nature, journaling, and other hobbies. Again, the key is to find one or more that you enjoy and can do regularly.  Healthy lifestyle. Eat a balanced diet, get plenty of sleep, and do not smoke. Avoid using alcohol or drugs to relax.  Strong support network. Spend time with family, friends, or other people you enjoy being around.Express your feelings and talk things over with someone you trust. Counseling or talktherapy with a mental health professional may be helpful if you are having difficulty managing stress on your own. Medicine is typically not recommended for the treatment of stress.Talk to your health care provider if you think you need medicine for symptoms of stress. HOME CARE INSTRUCTIONS  Keep all follow-up visits as directed by your health care provider.  Take all medicines as directed by your health care provider. SEEK MEDICAL CARE IF:  Your symptoms get worse or you start having new symptoms.  You feel overwhelmed by your problems and can no longer manage them on your own. SEEK IMMEDIATE MEDICAL CARE IF:  You feel like hurting yourself or someone else. Document Released: 09/06/2000 Document Revised: 07/28/2013 Document Reviewed: 11/05/2012 [facility] Patient Information 2015 [place], [place]. This information is not intended to replace advice given to you by your health care provider. Make sure  you discuss any questions you have with your health care provider.

## 2013-12-23 ENCOUNTER — Other Ambulatory Visit: Payer: Self-pay | Admitting: Family Medicine

## 2013-12-23 ENCOUNTER — Ambulatory Visit: Payer: Medicare Other | Admitting: Family Medicine

## 2013-12-24 ENCOUNTER — Ambulatory Visit (INDEPENDENT_AMBULATORY_CARE_PROVIDER_SITE_OTHER): Payer: Medicare Other | Admitting: Family Medicine

## 2013-12-24 VITALS — BP 130/84 | HR 92 | Temp 98.8°F | Resp 18 | Ht 71.0 in | Wt 262.0 lb

## 2013-12-24 DIAGNOSIS — F341 Dysthymic disorder: Secondary | ICD-10-CM

## 2013-12-24 DIAGNOSIS — R739 Hyperglycemia, unspecified: Secondary | ICD-10-CM

## 2013-12-24 DIAGNOSIS — R252 Cramp and spasm: Secondary | ICD-10-CM

## 2013-12-24 DIAGNOSIS — Z8719 Personal history of other diseases of the digestive system: Secondary | ICD-10-CM

## 2013-12-24 DIAGNOSIS — K5289 Other specified noninfective gastroenteritis and colitis: Secondary | ICD-10-CM

## 2013-12-24 DIAGNOSIS — F418 Other specified anxiety disorders: Secondary | ICD-10-CM

## 2013-12-24 DIAGNOSIS — E785 Hyperlipidemia, unspecified: Secondary | ICD-10-CM

## 2013-12-24 DIAGNOSIS — G894 Chronic pain syndrome: Secondary | ICD-10-CM

## 2013-12-24 DIAGNOSIS — I10 Essential (primary) hypertension: Secondary | ICD-10-CM

## 2013-12-24 DIAGNOSIS — R7309 Other abnormal glucose: Secondary | ICD-10-CM

## 2013-12-24 DIAGNOSIS — E782 Mixed hyperlipidemia: Secondary | ICD-10-CM

## 2013-12-24 DIAGNOSIS — K529 Noninfective gastroenteritis and colitis, unspecified: Secondary | ICD-10-CM

## 2013-12-24 MED ORDER — OXYCODONE HCL 10 MG PO TABS: 10.0000 mg | ORAL_TABLET | Freq: Three times a day (TID) | ORAL | Status: DC | PRN

## 2013-12-24 MED ORDER — MORPHINE SULFATE ER 30 MG PO TBCR: 30.0000 mg | EXTENDED_RELEASE_TABLET | Freq: Three times a day (TID) | ORAL | Status: DC

## 2013-12-24 NOTE — Assessment & Plan Note (Signed)
Resolving. His gastritis sx's are well controlled on pantoprazole 40mg  qd and carafate qid. Advancing diet well. Has GI f/u tomorrow, apparently gastric emptying study to be considered/discussed.

## 2013-12-24 NOTE — Assessment & Plan Note (Signed)
BP normal now.   Pt has been off HCTZ since admission to hospital recently. I recommended he continue daily bp monitoring and restart of HCTZ if/when bp gets over 140/90.
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
## 2013-12-24 NOTE — Assessment & Plan Note (Addendum)
Patient is pretty stoic but is becoming more in touch with his emotions and how to cope with them. He declines antidepressant or counseling at this time. He currently is looking into joining a suicide survivors support group and getting involved with suicide prevention. His wife is very supportive/involved with his care.

## 2013-12-24 NOTE — Assessment & Plan Note (Addendum)
Osteoarthritis of back and knees: Stable. No signif problems with constipation or oversedation. Continue current regimen:  I printed rx's for MS contin 30 mg tid, #90 today for October and November 2015 and rx's for oxycodone 10 mg tid prn, #90 for today, October, and November 2015.  Appropriate fill on/after date was noted on each rx.

## 2013-12-25 ENCOUNTER — Ambulatory Visit: Payer: Medicare Other | Admitting: Gastroenterology

## 2014-01-16 ENCOUNTER — Ambulatory Visit: Payer: Medicare Other | Admitting: Nurse Practitioner

## 2014-01-19 ENCOUNTER — Ambulatory Visit (INDEPENDENT_AMBULATORY_CARE_PROVIDER_SITE_OTHER): Payer: Medicare Other | Admitting: Family Medicine

## 2014-01-19 ENCOUNTER — Encounter: Payer: Self-pay | Admitting: Family Medicine

## 2014-01-19 VITALS — BP 123/80 | HR 92 | Temp 98.3°F | Resp 18 | Wt 249.0 lb

## 2014-01-19 DIAGNOSIS — Z8719 Personal history of other diseases of the digestive system: Secondary | ICD-10-CM

## 2014-01-19 DIAGNOSIS — M545 Low back pain, unspecified: Secondary | ICD-10-CM

## 2014-01-19 DIAGNOSIS — R739 Hyperglycemia, unspecified: Secondary | ICD-10-CM

## 2014-01-19 DIAGNOSIS — E785 Hyperlipidemia, unspecified: Secondary | ICD-10-CM

## 2014-01-19 DIAGNOSIS — E782 Mixed hyperlipidemia: Secondary | ICD-10-CM

## 2014-01-19 DIAGNOSIS — G894 Chronic pain syndrome: Secondary | ICD-10-CM

## 2014-01-19 DIAGNOSIS — W19XXXA Unspecified fall, initial encounter: Secondary | ICD-10-CM

## 2014-01-19 DIAGNOSIS — R252 Cramp and spasm: Secondary | ICD-10-CM

## 2014-01-19 DIAGNOSIS — I10 Essential (primary) hypertension: Secondary | ICD-10-CM

## 2014-01-19 MED ORDER — MORPHINE SULFATE ER 30 MG PO TBCR: 30.0000 mg | EXTENDED_RELEASE_TABLET | Freq: Three times a day (TID) | ORAL | Status: DC

## 2014-01-19 MED ORDER — ATORVASTATIN CALCIUM 20 MG PO TABS: 20.0000 mg | ORAL_TABLET | Freq: Every day | ORAL | Status: DC

## 2014-01-19 MED ORDER — OXYCODONE HCL 10 MG PO TABS
10.0000 mg | ORAL_TABLET | Freq: Three times a day (TID) | ORAL | Status: DC | PRN
Start: 2014-01-19 — End: 2014-03-02

## 2014-01-19 MED ORDER — OXYCODONE HCL 10 MG PO TABS: 10.0000 mg | ORAL_TABLET | Freq: Three times a day (TID) | ORAL | Status: DC | PRN
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
## 2014-01-19 NOTE — Progress Notes (Signed)
Pre visit review using our clinic review tool, if applicable. No additional management support is needed unless otherwise documented below in the visit note. 

## 2014-03-02 ENCOUNTER — Ambulatory Visit (INDEPENDENT_AMBULATORY_CARE_PROVIDER_SITE_OTHER): Payer: Medicare Other | Admitting: Family Medicine

## 2014-03-02 ENCOUNTER — Encounter: Payer: Self-pay | Admitting: Family Medicine

## 2014-03-02 VITALS — BP 143/89 | HR 77 | Temp 98.8°F | Resp 18 | Ht 71.0 in | Wt 263.0 lb

## 2014-03-02 DIAGNOSIS — R739 Hyperglycemia, unspecified: Secondary | ICD-10-CM

## 2014-03-02 DIAGNOSIS — E782 Mixed hyperlipidemia: Secondary | ICD-10-CM

## 2014-03-02 DIAGNOSIS — Z8719 Personal history of other diseases of the digestive system: Secondary | ICD-10-CM

## 2014-03-02 DIAGNOSIS — I10 Essential (primary) hypertension: Secondary | ICD-10-CM

## 2014-03-02 DIAGNOSIS — E785 Hyperlipidemia, unspecified: Secondary | ICD-10-CM

## 2014-03-02 DIAGNOSIS — R252 Cramp and spasm: Secondary | ICD-10-CM

## 2014-03-02 DIAGNOSIS — G894 Chronic pain syndrome: Secondary | ICD-10-CM

## 2014-03-02 MED ORDER — PROMETHAZINE HCL 12.5 MG RE SUPP: 12.5000 mg | Freq: Four times a day (QID) | RECTAL | Status: DC | PRN

## 2014-03-02 MED ORDER — MORPHINE SULFATE ER 30 MG PO TBCR: 30.0000 mg | EXTENDED_RELEASE_TABLET | Freq: Three times a day (TID) | ORAL | Status: DC

## 2014-03-02 MED ORDER — OXYCODONE HCL 10 MG PO TABS: 10.0000 mg | ORAL_TABLET | Freq: Three times a day (TID) | ORAL | Status: DC | PRN

## 2014-03-02 MED ORDER — PROMETHAZINE HCL 12.5 MG PO TABS: 12.5000 mg | ORAL_TABLET | Freq: Four times a day (QID) | ORAL | Status: DC | PRN

## 2014-03-02 NOTE — Progress Notes (Signed)
Pre visit review using our clinic review tool, if applicable. No additional management support is needed unless otherwise documented below in the visit note. 

## 2014-03-02 NOTE — Progress Notes (Signed)
OFFICE NOTE  03/02/2014  CC:  Chief Complaint  Patient presents with  . Follow-up   HPI: Patient is a 63 y.o. Caucasian male who is here for 3 mo f/u hyperlipidemia, HTN, chronic pain syndrome.   Left lower back pain from end of October is much improved.  X-ray done but result not in EMR. Chronic pain well controlled on current regimen. Home bp measurements consistently low 120s/80s.  He swears he got his lipids checked in hosp recently (approx 08/2013) and "they told me the were good".  I do not see these anywhere in EMR. Pt compliant with all meds.  Pertinent PMH:  Past medical, surgical, social, and family history reviewed and no changes are noted since last office visit.  MEDS:  Outpatient Prescriptions Prior to Visit  Medication Sig Dispense Refill  . aspirin EC 81 MG tablet Take 81 mg by mouth every morning.    Marland Kitchen atorvastatin (LIPITOR) 20 MG tablet Take 1 tablet (20 mg total) by mouth daily. 90 tablet 3  . clonazePAM (KLONOPIN) 1 MG tablet Take 1 mg by mouth 2 (two) times daily.    Marland Kitchen FIBER PO Take 2 tablets by mouth daily.    . hydrochlorothiazide (HYDRODIURIL) 25 MG tablet Take 25 mg by mouth daily.    Marland Kitchen lisinopril (PRINIVIL,ZESTRIL) 10 MG tablet Take 1 tablet (10 mg total) by mouth daily. 90 tablet 1  . metoprolol succinate (TOPROL-XL) 100 MG 24 hr tablet TAKE ONE TABLET BY MOUTH ONCE DAILY WITH OR IMMEDIATELY FOLLOWING A MEAL. 90 tablet 1  . morphine (MS CONTIN) 30 MG 12 hr tablet Take 1 tablet (30 mg total) by mouth every 8 (eight) hours. 90 tablet 0  . Multiple Vitamin (MULTIVITAMIN WITH MINERALS) TABS tablet Take 1 tablet by mouth daily.    . Oxycodone HCl 10 MG TABS Take 1 tablet (10 mg total) by mouth 3 (three) times daily as needed (for pain). 90 tablet 0  . pantoprazole (PROTONIX) 40 MG tablet Take 40 mg by mouth daily.    . promethazine (PHENERGAN) 12.5 MG tablet Take 1 tablet (12.5 mg total) by mouth every 6 (six) hours as needed for nausea. 45 tablet 0  .  sucralfate (CARAFATE) 1 G tablet Take 1 g by mouth 2 (two) times daily as needed (Stomach Pain).      No facility-administered medications prior to visit.    PE: Blood pressure 143/89, pulse 77, temperature 98.8 F (37.1 C), temperature source Temporal, resp. rate 18, height 5\' 11"  (1.803 m), weight 263 lb (119.296 kg), SpO2 93 %. Gen: Alert, well appearing.  Patient is oriented to person, place, time, and situation. AFFECT: pleasant, lucid thought and speech. No further exam today.  LABS: none today   IMPRESSION AND PLAN:  1) Chronic pain syndrome: Osteoarthritis of back and knees: stable.   No adverse effects from narcotics, no signs/no suspicion of narcotic abuse or diversion. I printed rx's for MS Contin 30mg , 1 tid, #90 and oxycodone 10mg , 1 tid prn, #90 today for this month, Jan 2016, Feb 2016.  Appropriate fill on/after date was noted on each rx.  2) HTN; The current medical regimen is effective;  continue present plan and medications.  3) Hyperlipidemia; The current medical regimen is effective;  continue present plan and medications.  An After Visit Summary was printed and given to the patient.  FOLLOW UP: 4 mo: needs fasting lipids and CMET at that time

## 2014-03-24 ENCOUNTER — Telehealth: Payer: Self-pay | Admitting: Family Medicine

## 2014-03-24 NOTE — Telephone Encounter (Signed)
Refill request for clonazepam.  Pt last RF was dated 02/28/14.  Please advise rf.

## 2014-03-25 MED ORDER — CLONAZEPAM 1 MG PO TABS: 1.0000 mg | ORAL_TABLET | Freq: Two times a day (BID) | ORAL | Status: DC

## 2014-03-25 NOTE — Telephone Encounter (Signed)
rx faxed

## 2014-03-25 NOTE — Telephone Encounter (Signed)
Clonaz rx printed. 

## 2014-04-13 ENCOUNTER — Other Ambulatory Visit: Payer: Self-pay | Admitting: Family Medicine

## 2014-04-16 ENCOUNTER — Telehealth: Payer: Self-pay | Admitting: Family Medicine

## 2014-04-16 NOTE — Telephone Encounter (Signed)
Per Dr. Anitra Lauth, it is okay to fill early. SW pharmacy and pt is aware.

## 2014-04-16 NOTE — Telephone Encounter (Signed)
Patient would like to pick up Rx today due to bad weather. Patient SW Dorothea Ogle at [facility] [phone] & he said as long as our office calls they will fill it today.

## 2014-04-23 ENCOUNTER — Other Ambulatory Visit: Payer: Self-pay | Admitting: Family Medicine

## 2014-04-30 ENCOUNTER — Other Ambulatory Visit: Payer: Self-pay | Admitting: Family Medicine

## 2014-05-26 DIAGNOSIS — J189 Pneumonia, unspecified organism: Secondary | ICD-10-CM

## 2014-05-26 HISTORY — DX: Pneumonia, unspecified organism: J18.9

## 2014-06-03 ENCOUNTER — Emergency Department (HOSPITAL_COMMUNITY): Payer: Medicare Other

## 2014-06-03 ENCOUNTER — Inpatient Hospital Stay (HOSPITAL_COMMUNITY)
Admission: EM | Admit: 2014-06-03 | Discharge: 2014-06-05 | DRG: 871 | Disposition: A | Discharge status: Home / Self Care | Payer: Medicare Other | Attending: Internal Medicine | Admitting: Internal Medicine

## 2014-06-03 ENCOUNTER — Encounter (HOSPITAL_COMMUNITY): Payer: Self-pay | Admitting: Emergency Medicine

## 2014-06-03 DIAGNOSIS — J9601 Acute respiratory failure with hypoxia: Secondary | ICD-10-CM | POA: Diagnosis present

## 2014-06-03 DIAGNOSIS — I129 Hypertensive chronic kidney disease with stage 1 through stage 4 chronic kidney disease, or unspecified chronic kidney disease: Secondary | ICD-10-CM | POA: Diagnosis present

## 2014-06-03 DIAGNOSIS — M545 Low back pain: Secondary | ICD-10-CM | POA: Diagnosis present

## 2014-06-03 DIAGNOSIS — G43909 Migraine, unspecified, not intractable, without status migrainosus: Secondary | ICD-10-CM | POA: Diagnosis present

## 2014-06-03 DIAGNOSIS — J9600 Acute respiratory failure, unspecified whether with hypoxia or hypercapnia: Secondary | ICD-10-CM | POA: Diagnosis present

## 2014-06-03 DIAGNOSIS — F419 Anxiety disorder, unspecified: Secondary | ICD-10-CM | POA: Diagnosis present

## 2014-06-03 DIAGNOSIS — G8929 Other chronic pain: Secondary | ICD-10-CM | POA: Diagnosis present

## 2014-06-03 DIAGNOSIS — N182 Chronic kidney disease, stage 2 (mild): Secondary | ICD-10-CM | POA: Diagnosis present

## 2014-06-03 DIAGNOSIS — N179 Acute kidney failure, unspecified: Secondary | ICD-10-CM | POA: Diagnosis present

## 2014-06-03 DIAGNOSIS — G40909 Epilepsy, unspecified, not intractable, without status epilepticus: Secondary | ICD-10-CM | POA: Diagnosis present

## 2014-06-03 DIAGNOSIS — A419 Sepsis, unspecified organism: Secondary | ICD-10-CM | POA: Diagnosis not present

## 2014-06-03 DIAGNOSIS — E782 Mixed hyperlipidemia: Secondary | ICD-10-CM | POA: Diagnosis present

## 2014-06-03 DIAGNOSIS — J189 Pneumonia, unspecified organism: Secondary | ICD-10-CM | POA: Diagnosis present

## 2014-06-03 DIAGNOSIS — M1990 Unspecified osteoarthritis, unspecified site: Secondary | ICD-10-CM | POA: Diagnosis present

## 2014-06-03 DIAGNOSIS — R0602 Shortness of breath: Secondary | ICD-10-CM | POA: Diagnosis not present

## 2014-06-03 DIAGNOSIS — I10 Essential (primary) hypertension: Secondary | ICD-10-CM | POA: Diagnosis present

## 2014-06-03 DIAGNOSIS — F329 Major depressive disorder, single episode, unspecified: Secondary | ICD-10-CM | POA: Diagnosis present

## 2014-06-03 DIAGNOSIS — K219 Gastro-esophageal reflux disease without esophagitis: Secondary | ICD-10-CM | POA: Diagnosis present

## 2014-06-03 HISTORY — DX: Pneumonia, unspecified organism: J18.9

## 2014-06-03 LAB — BASIC METABOLIC PANEL
Anion gap: 8 (ref 5–15)
BUN: 44 mg/dL — AB (ref 6–23)
CO2: 34 mmol/L — AB (ref 19–32)
Calcium: 8.7 mg/dL (ref 8.4–10.5)
Chloride: 89 mmol/L — ABNORMAL LOW (ref 96–112)
Creatinine, Ser: 2.92 mg/dL — ABNORMAL HIGH (ref 0.50–1.35)
GFR calc Af Amer: 25 mL/min — ABNORMAL LOW (ref >90)
GFR, EST NON AFRICAN AMERICAN: 22 mL/min — AB (ref >90)
Glucose, Bld: 129 mg/dL — ABNORMAL HIGH (ref 70–99)
Potassium: 4.7 mmol/L (ref 3.5–5.1)
Sodium: 131 mmol/L — ABNORMAL LOW (ref 135–145)

## 2014-06-03 LAB — CBC WITH DIFFERENTIAL/PLATELET
BASOS ABS: 0 10*3/uL (ref 0.0–0.1)
Basophils Relative: 0 % (ref 0–1)
Eosinophils Absolute: 0 10*3/uL (ref 0.0–0.7)
Eosinophils Relative: 0 % (ref 0–5)
HEMATOCRIT: 42.5 % (ref 39.0–52.0)
Hemoglobin: 14 g/dL (ref 13.0–17.0)
LYMPHS PCT: 7 % — AB (ref 12–46)
Lymphs Abs: 1 10*3/uL (ref 0.7–4.0)
MCH: 32.1 pg (ref 26.0–34.0)
MCHC: 32.9 g/dL (ref 30.0–36.0)
MCV: 97.5 fL (ref 78.0–100.0)
MONO ABS: 2 10*3/uL — AB (ref 0.1–1.0)
MONOS PCT: 15 % — AB (ref 3–12)
NEUTROS PCT: 77 % (ref 43–77)
Neutro Abs: 10 10*3/uL — ABNORMAL HIGH (ref 1.7–7.7)
Platelets: 184 10*3/uL (ref 150–400)
RBC: 4.36 MIL/uL (ref 4.22–5.81)
RDW: 12.5 % (ref 11.5–15.5)
WBC: 13 10*3/uL — ABNORMAL HIGH (ref 4.0–10.5)

## 2014-06-03 LAB — HEPATIC FUNCTION PANEL
ALT: 26 U/L (ref 0–53)
AST: 20 U/L (ref 0–37)
Albumin: 3.1 g/dL — ABNORMAL LOW (ref 3.5–5.2)
Alkaline Phosphatase: 51 U/L (ref 39–117)
BILIRUBIN DIRECT: 0.1 mg/dL (ref 0.0–0.5)
BILIRUBIN TOTAL: 0.5 mg/dL (ref 0.3–1.2)
Indirect Bilirubin: 0.4 mg/dL (ref 0.3–0.9)
TOTAL PROTEIN: 6.3 g/dL (ref 6.0–8.3)

## 2014-06-03 LAB — URINALYSIS, ROUTINE W REFLEX MICROSCOPIC
Bilirubin Urine: NEGATIVE
GLUCOSE, UA: NEGATIVE mg/dL
Hgb urine dipstick: NEGATIVE
Ketones, ur: NEGATIVE mg/dL
LEUKOCYTES UA: NEGATIVE
Nitrite: NEGATIVE
Protein, ur: NEGATIVE mg/dL
Specific Gravity, Urine: 1.025 (ref 1.005–1.030)
Urobilinogen, UA: 0.2 mg/dL (ref 0.0–1.0)
pH: 5.5 (ref 5.0–8.0)

## 2014-06-03 LAB — LACTIC ACID, PLASMA: LACTIC ACID, VENOUS: 1.6 mmol/L (ref 0.5–2.0)

## 2014-06-03 LAB — INFLUENZA PANEL BY PCR (TYPE A & B)
H1N1 flu by pcr: NOT DETECTED
INFLAPCR: NEGATIVE
Influenza B By PCR: NEGATIVE

## 2014-06-03 LAB — MRSA PCR SCREENING: MRSA by PCR: NEGATIVE

## 2014-06-03 LAB — EXPECTORATED SPUTUM ASSESSMENT W GRAM STAIN, RFLX TO RESP C

## 2014-06-03 LAB — BRAIN NATRIURETIC PEPTIDE: B NATRIURETIC PEPTIDE 5: 189 pg/mL — AB (ref 0.0–100.0)

## 2014-06-03 LAB — D-DIMER, QUANTITATIVE: D-Dimer, Quant: 0.52 ug/mL-FEU — ABNORMAL HIGH (ref 0.00–0.48)

## 2014-06-03 MED ORDER — CLONAZEPAM 0.5 MG PO TABS
1.0000 mg | ORAL_TABLET | Freq: Two times a day (BID) | ORAL | Status: DC
  Administered 2014-06-03 – 2014-06-05 (×4): 1 mg via ORAL
  Filled 2014-06-03 (×4): qty 2

## 2014-06-03 MED ORDER — SODIUM CHLORIDE 0.9 % IV BOLUS (SEPSIS)
2000.0000 mL | Freq: Once | INTRAVENOUS | Status: AC
  Administered 2014-06-03: 2000 mL via INTRAVENOUS

## 2014-06-03 MED ORDER — ATORVASTATIN CALCIUM 20 MG PO TABS
20.0000 mg | ORAL_TABLET | Freq: Every day | ORAL | Status: DC
  Administered 2014-06-03 – 2014-06-05 (×3): 20 mg via ORAL
  Filled 2014-06-03 (×3): qty 1

## 2014-06-03 MED ORDER — ALUM & MAG HYDROXIDE-SIMETH 200-200-20 MG/5ML PO SUSP: 30.0000 mL | Freq: Four times a day (QID) | ORAL | Status: DC | PRN

## 2014-06-03 MED ORDER — ONDANSETRON HCL 4 MG/2ML IJ SOLN: 4.0000 mg | Freq: Four times a day (QID) | INTRAMUSCULAR | Status: DC | PRN

## 2014-06-03 MED ORDER — ALBUTEROL SULFATE (2.5 MG/3ML) 0.083% IN NEBU
2.5000 mg | INHALATION_SOLUTION | Freq: Four times a day (QID) | RESPIRATORY_TRACT | Status: DC
  Administered 2014-06-03 – 2014-06-04 (×5): 2.5 mg via RESPIRATORY_TRACT
  Filled 2014-06-03 (×5): qty 3

## 2014-06-03 MED ORDER — CEFTRIAXONE SODIUM IN DEXTROSE 20 MG/ML IV SOLN
1.0000 g | INTRAVENOUS | Status: DC
  Filled 2014-06-03: qty 50

## 2014-06-03 MED ORDER — PANTOPRAZOLE SODIUM 40 MG PO TBEC
40.0000 mg | DELAYED_RELEASE_TABLET | Freq: Every day | ORAL | Status: DC
  Administered 2014-06-04 – 2014-06-05 (×2): 40 mg via ORAL
  Filled 2014-06-03 (×2): qty 1

## 2014-06-03 MED ORDER — LEVOFLOXACIN IN D5W 750 MG/150ML IV SOLN
750.0000 mg | Freq: Once | INTRAVENOUS | Status: AC
  Administered 2014-06-03: 750 mg via INTRAVENOUS
  Filled 2014-06-03: qty 150

## 2014-06-03 MED ORDER — DEXTROSE 5 % IV SOLN
1.0000 g | INTRAVENOUS | Status: DC
  Administered 2014-06-03 – 2014-06-05 (×3): 1 g via INTRAVENOUS
  Filled 2014-06-03 (×5): qty 10

## 2014-06-03 MED ORDER — ACETAMINOPHEN 325 MG PO TABS: 650.0000 mg | ORAL_TABLET | Freq: Four times a day (QID) | ORAL | Status: DC | PRN

## 2014-06-03 MED ORDER — ENOXAPARIN SODIUM 60 MG/0.6ML ~~LOC~~ SOLN
60.0000 mg | SUBCUTANEOUS | Status: DC
  Administered 2014-06-03: 60 mg via SUBCUTANEOUS
  Filled 2014-06-03 (×2): qty 0.6

## 2014-06-03 MED ORDER — SODIUM CHLORIDE 0.9 % IV SOLN
INTRAVENOUS | Status: DC
  Administered 2014-06-03 – 2014-06-04 (×2): via INTRAVENOUS

## 2014-06-03 MED ORDER — SENNA 8.6 MG PO TABS
1.0000 | ORAL_TABLET | Freq: Two times a day (BID) | ORAL | Status: DC
  Administered 2014-06-03 – 2014-06-05 (×4): 8.6 mg via ORAL
  Filled 2014-06-03 (×4): qty 1

## 2014-06-03 MED ORDER — MORPHINE SULFATE ER 30 MG PO TBCR
30.0000 mg | EXTENDED_RELEASE_TABLET | Freq: Three times a day (TID) | ORAL | Status: DC
Start: 2014-06-03 — End: 2014-06-05
  Administered 2014-06-03 – 2014-06-05 (×7): 30 mg via ORAL
  Filled 2014-06-03: qty 2
  Filled 2014-06-03 (×2): qty 1
  Filled 2014-06-03: qty 2
  Filled 2014-06-03: qty 1
  Filled 2014-06-03: qty 2
  Filled 2014-06-03: qty 1

## 2014-06-03 MED ORDER — ACETAMINOPHEN 650 MG RE SUPP: 650.0000 mg | Freq: Four times a day (QID) | RECTAL | Status: DC | PRN

## 2014-06-03 MED ORDER — SODIUM CHLORIDE 0.9 % IV SOLN
Freq: Once | INTRAVENOUS | Status: AC
  Administered 2014-06-03: 13:00:00 via INTRAVENOUS

## 2014-06-03 MED ORDER — ASPIRIN EC 81 MG PO TBEC
81.0000 mg | DELAYED_RELEASE_TABLET | Freq: Every morning | ORAL | Status: DC
  Administered 2014-06-04 – 2014-06-05 (×2): 81 mg via ORAL
  Filled 2014-06-03 (×2): qty 1

## 2014-06-03 MED ORDER — DEXTROSE 5 % IV SOLN
500.0000 mg | INTRAVENOUS | Status: DC
  Administered 2014-06-03 – 2014-06-04 (×2): 500 mg via INTRAVENOUS
  Filled 2014-06-03 (×5): qty 500

## 2014-06-03 MED ORDER — SODIUM CHLORIDE 0.9 % IV SOLN
Freq: Once | INTRAVENOUS | Status: AC
  Administered 2014-06-03: 11:00:00 via INTRAVENOUS

## 2014-06-03 MED ORDER — GUAIFENESIN-DM 100-10 MG/5ML PO SYRP: 5.0000 mL | ORAL_SOLUTION | ORAL | Status: DC | PRN

## 2014-06-03 MED ORDER — TRAZODONE HCL 50 MG PO TABS: 25.0000 mg | ORAL_TABLET | Freq: Every evening | ORAL | Status: DC | PRN

## 2014-06-03 MED ORDER — BISACODYL 10 MG RE SUPP: 10.0000 mg | Freq: Every day | RECTAL | Status: DC | PRN

## 2014-06-03 MED ORDER — CETYLPYRIDINIUM CHLORIDE 0.05 % MT LIQD
7.0000 mL | Freq: Two times a day (BID) | OROMUCOSAL | Status: DC
Start: 2014-06-03 — End: 2014-06-05
  Administered 2014-06-03 – 2014-06-04 (×3): 7 mL via OROMUCOSAL

## 2014-06-03 MED ORDER — ONDANSETRON HCL 4 MG PO TABS: 4.0000 mg | ORAL_TABLET | Freq: Four times a day (QID) | ORAL | Status: DC | PRN
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
## 2014-06-03 NOTE — ED Notes (Signed)
Called floor to give report.  Nurse to call back.

## 2014-06-03 NOTE — Progress Notes (Signed)
Pharmacy Note:  Renal Dose Adjustment  Initial antibiotics for Rocephin and Zithromax noted.    CrCl cannot be calculated (Unknown ideal weight.).   No Known Allergies  Filed Vitals:   06/03/14 1600  BP: 108/61  Pulse: 74  Temp: 97.7 F (36.5 C)  Resp: 14    Anti-infectives    Start     Dose/Rate Route Frequency Ordered Stop   06/03/14 1800  azithromycin (ZITHROMAX) 500 mg in dextrose 5 % 250 mL IVPB     500 mg 250 mL/hr over 60 Minutes Intravenous Every 24 hours 06/03/14 1516 06/10/14 1759   06/03/14 1700  cefTRIAXone (ROCEPHIN) 1 g in dextrose 5 % 50 mL IVPB - Premix  Status:  Discontinued     1 g 100 mL/hr over 30 Minutes Intravenous Every 24 hours 06/03/14 1516 06/03/14 1544   06/03/14 1700  cefTRIAXone (ROCEPHIN) 1 g in dextrose 5 % 50 mL IVPB     1 g 100 mL/hr over 30 Minutes Intravenous Every 24 hours 06/03/14 1545     06/03/14 1145  levofloxacin (LEVAQUIN) IVPB 750 mg     750 mg 100 mL/hr over 90 Minutes Intravenous  Once 06/03/14 1132 06/03/14 1341     Plan: Continue Rocephin and Zithromax as ordered. Renal adjustment is not needed.  Ena Dawley, [facility] 06/03/2014 5:12 PM

## 2014-06-03 NOTE — ED Notes (Signed)
Sats currently 96% on 4l/m.

## 2014-06-03 NOTE — ED Notes (Signed)
Dr Roderic Palau reduced O2 to 4l/m via n/c.

## 2014-06-03 NOTE — ED Notes (Signed)
Not eating.  Cough with thick clear sputum.  Having been treating self with mucinex OTC.  Having Temperature on and off for 2-3 days.  Temp yesterday 101 per family.

## 2014-06-03 NOTE — ED Notes (Addendum)
Pts SAT's dropped to 83%.  Placed on 02 @ 2 liters and increased to 4l/m.  Sats remained 86%.  Pt placed on NRB, SAT's increased to 99-100%.  Pts SBP in 70's, giving NS bolus of 1000 ml.  Dr Roderic Palau notified.
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
## 2014-06-03 NOTE — ED Notes (Signed)
SOB, Sats were 52% on RA.  EMS placed pt on NRB.  Pt has been sick for the last 7 days.  CBG 212.  Pt given breathing treatment by EMS.

## 2014-06-04 LAB — HEMOGLOBIN A1C
Hgb A1c MFr Bld: 6.5 % — ABNORMAL HIGH (ref 4.8–5.6)
Mean Plasma Glucose: 140 mg/dL

## 2014-06-04 LAB — STREP PNEUMONIAE URINARY ANTIGEN: Strep Pneumo Urinary Antigen: NEGATIVE

## 2014-06-04 LAB — CBC
HCT: 35.7 % — ABNORMAL LOW (ref 39.0–52.0)
HEMOGLOBIN: 11.9 g/dL — AB (ref 13.0–17.0)
MCH: 32.1 pg (ref 26.0–34.0)
MCHC: 33.3 g/dL (ref 30.0–36.0)
MCV: 96.2 fL (ref 78.0–100.0)
Platelets: 155 10*3/uL (ref 150–400)
RBC: 3.71 MIL/uL — AB (ref 4.22–5.81)
RDW: 12.6 % (ref 11.5–15.5)
WBC: 8.4 10*3/uL (ref 4.0–10.5)

## 2014-06-04 LAB — BASIC METABOLIC PANEL
Anion gap: 5 (ref 5–15)
BUN: 29 mg/dL — ABNORMAL HIGH (ref 6–23)
CALCIUM: 7.8 mg/dL — AB (ref 8.4–10.5)
CHLORIDE: 97 mmol/L (ref 96–112)
CO2: 31 mmol/L (ref 19–32)
Creatinine, Ser: 1.37 mg/dL — ABNORMAL HIGH (ref 0.50–1.35)
GFR calc Af Amer: 63 mL/min — ABNORMAL LOW (ref >90)
GFR calc non Af Amer: 54 mL/min — ABNORMAL LOW (ref >90)
Glucose, Bld: 98 mg/dL (ref 70–99)
POTASSIUM: 3.9 mmol/L (ref 3.5–5.1)
SODIUM: 133 mmol/L — AB (ref 135–145)

## 2014-06-04 LAB — HIV ANTIBODY (ROUTINE TESTING W REFLEX): HIV SCREEN 4TH GENERATION: NONREACTIVE

## 2014-06-04 MED ORDER — ALBUTEROL SULFATE (2.5 MG/3ML) 0.083% IN NEBU
2.5000 mg | INHALATION_SOLUTION | Freq: Three times a day (TID) | RESPIRATORY_TRACT | Status: DC
  Administered 2014-06-05 (×2): 2.5 mg via RESPIRATORY_TRACT
  Filled 2014-06-04 (×2): qty 3

## 2014-06-04 MED ORDER — GUAIFENESIN ER 600 MG PO TB12
1200.0000 mg | ORAL_TABLET | Freq: Two times a day (BID) | ORAL | Status: DC
  Administered 2014-06-04 – 2014-06-05 (×3): 1200 mg via ORAL
  Filled 2014-06-04 (×3): qty 2

## 2014-06-04 MED ORDER — DEXTROSE 5 % IV SOLN
INTRAVENOUS | Status: AC
  Filled 2014-06-04: qty 10

## 2014-06-04 MED ORDER — DEXTROSE 5 % IV SOLN
INTRAVENOUS | Status: AC
  Filled 2014-06-04: qty 500

## 2014-06-04 NOTE — Progress Notes (Signed)
[facility] PROGRESS NOTE  Richard Levine UXL:[id number] DOB: 01-14-1951 DOA: 06/03/2014 PCP: Tammi Sou, MD  Assessment/Plan: 1. Acute respiratory failure with hypoxia. Related to Any acquired pneumonia. Appears to be improving. Currently on 5 L nasal cannula with normal work of breathing. We will wean down oxygen as tolerated 2. Sepsis. Related to pneumonia. Hemodynamics have improved. He's been afebrile. Leukocytosis has resolved. Follow-up cultures. Continue current treatments 3. Community acquired pneumonia. Continue Rocephin and azithromycin. Encourage pulmonary hygiene. Urinary antigens in process. Clinically improving 4. Acute renal failure. Prerenal related to sepsis. Significantly improved with IV fluids. Continue current treatments. 5. Hypertension. Blood pressure was low on admission. Continue to hold antihypertensives for now and can resume when appropriate. 6. Chronic low back pain. Continue outpatient regimen 7. Hyperlipidemia. Continue statin  Code Status: full code Family Communication: no family present Disposition Plan: discharge home once improved, transfer med/surg   Consultants:    Procedures:    Antibiotics:  Rocephin 3/9  Azithromycin 3/9  HPI/Subjective: Patient is feeling better today. He continues to have a productive cough. Shortness of breath is improving.  Objective: Filed Vitals:   06/04/14 1000  BP: 116/58  Pulse: 80  Temp:   Resp: 16    Intake/Output Summary (Last 24 hours) at 06/04/14 1039 Last data filed at 06/04/14 0749  Gross per 24 hour  Intake 158.33 ml  Output   3500 ml  Net -3341.67 ml   Filed Weights   06/03/14 1611 06/04/14 0500  Weight: 125 kg (275 lb 9.2 oz) 125 kg (275 lb 9.2 oz)    Exam:   General:  NAD  Cardiovascular: S1, S2 RRR  Respiratory: bilateral rhonchi  Abdomen: soft, nt, nd, bs+  Musculoskeletal: no edema b/l   Data Reviewed: Basic Metabolic Panel:  Recent Labs Lab  06/03/14 1032 06/04/14 0517  NA 131* 133*  K 4.7 3.9  CL 89* 97  CO2 34* 31  GLUCOSE 129* 98  BUN 44* 29*  CREATININE 2.92* 1.37*  CALCIUM 8.7 7.8*   Liver Function Tests:  Recent Labs Lab 06/03/14 1200  AST 20  ALT 26  ALKPHOS 51  BILITOT 0.5  PROT 6.3  ALBUMIN 3.1*   No results for input(s): LIPASE, AMYLASE in the last 168 hours. No results for input(s): AMMONIA in the last 168 hours. CBC:  Recent Labs Lab 06/03/14 1032 06/04/14 0517  WBC 13.0* 8.4  NEUTROABS 10.0*  --   HGB 14.0 11.9*  HCT 42.5 35.7*  MCV 97.5 96.2  PLT 184 155   Cardiac Enzymes: No results for input(s): CKTOTAL, CKMB, CKMBINDEX, TROPONINI in the last 168 hours. BNP (last 3 results)  Recent Labs  06/03/14 1200  BNP 189.0*    ProBNP (last 3 results) No results for input(s): PROBNP in the last 8760 hours.  CBG: No results for input(s): GLUCAP in the last 168 hours.  Recent Results (from the past 240 hour(s))  Blood culture (routine x 2)     Status: None (Preliminary result)   Collection Time: 06/03/14 11:51 AM  Result Value Ref Range Status   Specimen Description BLOOD LEFT HAND  Final   Special Requests   Final    BOTTLES DRAWN AEROBIC AND ANAEROBIC AEB=8CC  ANA=6CC   Culture NO GROWTH 1 DAY  Final   Report Status PENDING  Incomplete  Blood culture (routine x 2)     Status: None (Preliminary result)   Collection Time: 06/03/14 12:00 PM  Result Value Ref Range Status   Specimen  Description BLOOD LEFT ANTECUBITAL  Final   Special Requests   Final    BOTTLES DRAWN AEROBIC AND ANAEROBIC AEB=8CC ANA=6CC   Culture NO GROWTH 1 DAY  Final   Report Status PENDING  Incomplete  MRSA PCR Screening     Status: None   Collection Time: 06/03/14  3:40 PM  Result Value Ref Range Status   MRSA by PCR NEGATIVE NEGATIVE Final    Comment:        The GeneXpert MRSA Assay (FDA approved for NASAL specimens only), is one component of a comprehensive MRSA colonization surveillance program. It  is not intended to diagnose MRSA infection nor to guide or monitor treatment for MRSA infections.   Culture, sputum-assessment     Status: None   Collection Time: 06/03/14  4:30 PM  Result Value Ref Range Status   Specimen Description SPUTUM EXPECTORATED  Final   Special Requests NONE  Final   Sputum evaluation   Final    THIS SPECIMEN IS ACCEPTABLE. RESPIRATORY CULTURE REPORT TO FOLLOW. Performed at [facility]    Report Status 06/03/2014 FINAL  Final  Culture, respiratory (NON-Expectorated)     Status: None (Preliminary result)   Collection Time: 06/03/14  4:30 PM  Result Value Ref Range Status   Specimen Description SPUTUM EXPECTORATED  Final   Special Requests NONE  Final   Gram Stain   Final    ABUNDANT WBC PRESENT, PREDOMINANTLY PMN FEW SQUAMOUS EPITHELIAL CELLS PRESENT FEW GRAM POSITIVE COCCI IN PAIRS RARE GRAM VARIABLE ROD Performed at [facility]    Culture PENDING  Incomplete   Report Status PENDING  Incomplete     Studies: Ct Chest Wo Contrast  06/03/2014   CLINICAL DATA:  Cough for 7 days with intermittent fever for 2-3 days. Shortness of breath and low O2 sats, initial encounter.  EXAM: CT CHEST WITHOUT CONTRAST  TECHNIQUE: Multidetector CT imaging of the chest was performed following the standard protocol without IV contrast.  COMPARISON:  Chest radiograph 06/03/2014.  FINDINGS: Mediastinum/Nodes: No pathologically enlarged mediastinal or axillary lymph nodes. Hilar regions are difficult to definitively evaluate without IV contrast pulmonary arteries are enlarged. Three-vessel coronary artery calcification. Heart size normal. No pericardial effusion. Pre pericardiac lymph node is sub cm in short axis size. Incidental note is made of bilateral gynecomastia.  Lungs/Pleura: Scattered ill-defined peribronchovascular nodularity and consolidation bilaterally. No pleural fluid. Airway is unremarkable.  Upper abdomen: Visualized portions of the liver,  gallbladder, right adrenal gland, spleen, pancreas, stomach and bowel are grossly unremarkable. No upper abdominal adenopathy.  Musculoskeletal: No worrisome lytic or sclerotic lesions. Degenerative changes are seen in the spine.  IMPRESSION: 1. Multilobar peribronchovascular nodularity and consolidation, indicative of an infectious bronchiolitis/bronchopneumonia. 2. Enlarged pulmonary arteries, indicative of pulmonary arterial hypertension. 3. Three-vessel coronary artery calcification.   Electronically Signed   By: Lorin Picket M.D.   On: 06/03/2014 12:44   Dg Chest Portable 1 View  06/03/2014   CLINICAL DATA:  Cough for 7 days with intermittent fever for 2-3 days. Shortness of breath and low O2 sats. initial encounter.  EXAM: PORTABLE CHEST - 1 VIEW  COMPARISON:  12/22/2011.  FINDINGS: Trachea is midline. Heart size stable. Lungs are clear. No pleural fluid.  IMPRESSION: No acute findings.   Electronically Signed   By: Lorin Picket M.D.   On: 06/03/2014 10:31    Scheduled Meds: . albuterol  2.5 mg Nebulization Q6H  . antiseptic oral rinse  7 mL Mouth Rinse BID  .  aspirin EC  81 mg Oral q morning - 10a  . atorvastatin  20 mg Oral q1800  . azithromycin  500 mg Intravenous Q24H  . cefTRIAXone (ROCEPHIN)  IV  1 g Intravenous Q24H  . clonazePAM  1 mg Oral BID  . enoxaparin (LOVENOX) injection  60 mg Subcutaneous Q24H  . morphine  30 mg Oral Q8H  . pantoprazole  40 mg Oral Daily  . senna  1 tablet Oral BID   Continuous Infusions: . sodium chloride 100 mL/hr at 06/03/14 1556    Principal Problem:   Acute respiratory failure with hypoxia Active Problems:   Essential hypertension   Chronic low back pain   Hyperlipidemia, mixed   Pneumonia   Sepsis   Acute kidney injury   Acute respiratory failure   Community acquired pneumonia    Time spent: 86mins    Zakayla Martinec  [facility] Pager [phone]. If 7PM-7AM, please contact night-coverage at [email or web address], password  [facility] 06/04/2014, 10:39 AM  LOS: 1 day

## 2014-06-04 NOTE — Progress Notes (Signed)
Pt a/o.vss. No complaints of any distress. IV patent. o2 on 3L/Center. Report given to [facility], [place]. Pt to be transferred to room 311 via wheelchair by nursing staff.

## 2014-06-04 NOTE — Care Management Utilization Note (Signed)
UR completed 

## 2014-06-04 NOTE — Care Management Note (Addendum)
    Page 1 of 1   06/05/2014     4:08:07 PM CARE MANAGEMENT NOTE 06/05/2014  Patient:  Richard Levine, Richard Levine   Account Number:  [id number]  Date Initiated:  06/04/2014  Documentation initiated by:  Jolene Provost  Subjective/Objective Assessment:   Pt is from home, lives with wife who is a Marine scientist. Pt independent at baseline. Pt has no HH services, DME's or med needs prior to admission. Pt plans to dsicharge home with self care. No CM needs identified or anticipated.     Action/Plan:   Anticipated DC Date:  06/06/2014   Anticipated DC Plan:  HOME/SELF CARE      DC Planning Services  CM consult      Choice offered to / List presented to:             Status of service:  Completed, signed off Medicare Important Message given?  YES (If response is "NO", the following Medicare IM given date fields will be blank) Date Medicare IM given:  06/05/2014 Medicare IM given by:  Jolene Provost Date Additional Medicare IM given:   Additional Medicare IM given by:    Discharge Disposition:  HOME/SELF CARE  Per UR Regulation:  Reviewed for med. necessity/level of care/duration of stay  If discussed at [place] of Stay Meetings, dates discussed:    Comments:  06/05/2014 [place], RN, MSN, CM 06/04/2014 Tees Toh, RN, MSN, CM

## 2014-06-05 DIAGNOSIS — I10 Essential (primary) hypertension: Secondary | ICD-10-CM

## 2014-06-05 LAB — BASIC METABOLIC PANEL
Anion gap: 7 (ref 5–15)
BUN: 16 mg/dL (ref 6–23)
CHLORIDE: 98 mmol/L (ref 96–112)
CO2: 35 mmol/L — AB (ref 19–32)
Calcium: 8.3 mg/dL — ABNORMAL LOW (ref 8.4–10.5)
Creatinine, Ser: 1 mg/dL (ref 0.50–1.35)
GFR calc Af Amer: >90 mL/min (ref >90)
GFR, EST NON AFRICAN AMERICAN: 79 mL/min — AB (ref >90)
GLUCOSE: 108 mg/dL — AB (ref 70–99)
Potassium: 3.7 mmol/L (ref 3.5–5.1)
SODIUM: 140 mmol/L (ref 135–145)

## 2014-06-05 LAB — LEGIONELLA ANTIGEN, URINE

## 2014-06-05 MED ORDER — LEVOFLOXACIN 750 MG PO TABS: 750.0000 mg | ORAL_TABLET | Freq: Every day | ORAL | Status: DC

## 2014-06-05 MED ORDER — AZITHROMYCIN 250 MG PO TABS
500.0000 mg | ORAL_TABLET | Freq: Every day | ORAL | Status: DC
  Administered 2014-06-05: 500 mg via ORAL
  Filled 2014-06-05: qty 2

## 2014-06-05 MED ORDER — GUAIFENESIN ER 600 MG PO TB12: 1200.0000 mg | ORAL_TABLET | Freq: Two times a day (BID) | ORAL | Status: DC

## 2014-06-05 NOTE — Progress Notes (Signed)
Pharmacy Note:  Renal Dose Adjustment  Rocephin and Zithromax noted.  Pt improving.  Estimated Creatinine Clearance: 102.9 mL/min (by C-G formula based on Cr of 1).   No Known Allergies  Filed Vitals:   06/05/14 0552  BP: 144/70  Pulse: 78  Temp: 98 F (36.7 C)  Resp: 16    Anti-infectives    Start     Dose/Rate Route Frequency Ordered Stop   06/05/14 1800  azithromycin (ZITHROMAX) tablet 500 mg     500 mg Oral Daily-1800 06/05/14 1312 06/10/14 1759   06/03/14 1800  azithromycin (ZITHROMAX) 500 mg in dextrose 5 % 250 mL IVPB  Status:  Discontinued     500 mg 250 mL/hr over 60 Minutes Intravenous Every 24 hours 06/03/14 1516 06/05/14 1312   06/03/14 1700  cefTRIAXone (ROCEPHIN) 1 g in dextrose 5 % 50 mL IVPB - Premix  Status:  Discontinued     1 g 100 mL/hr over 30 Minutes Intravenous Every 24 hours 06/03/14 1516 06/03/14 1544   06/03/14 1700  cefTRIAXone (ROCEPHIN) 1 g in dextrose 5 % 50 mL IVPB     1 g 100 mL/hr over 30 Minutes Intravenous Every 24 hours 06/03/14 1545     06/03/14 1145  levofloxacin (LEVAQUIN) IVPB 750 mg     750 mg 100 mL/hr over 90 Minutes Intravenous  Once 06/03/14 1132 06/03/14 1341     PHARMACIST - PHYSICIAN COMMUNICATION CONCERNING: Antibiotic IV to Oral Route Change Policy  RECOMMENDATION: This patient is receiving Zithromax by the intravenous route.  Based on criteria approved by the Pharmacy and Therapeutics Committee, the antibiotic(s) is/are being converted to the equivalent oral dose form(s).  DESCRIPTION: These criteria include:  Patient being treated for a respiratory tract infection, urinary tract infection, cellulitis or clostridium difficile associated diarrhea if on metronidazole  The patient is not neutropenic and does not exhibit a GI malabsorption state  The patient is eating (either orally or via tube) and/or has been taking other orally administered medications for a least 24 hours  The patient is improving clinically and has  a Tmax < 100.5  If you have questions about this conversion, please contact the Pharmacy Department  [x]   [phone] )  [facility] []   [phone] )  [facility]  []   [phone] )  [facility] []   [phone] )  [facility], [place], [facility] 06/05/2014 1:12 PM

## 2014-06-05 NOTE — Progress Notes (Signed)
Patient has +1 BLE edema and states he has not been receiving his hydrochlorothiazide.  Notified MD.  Received orders to stop fluids.  Will continue to monitor patient.

## 2014-06-05 NOTE — Discharge Summary (Signed)
Physician Discharge Summary  Richard Levine AST:[id number] DOB: 12/05/1950 DOA: 06/03/2014  PCP: Tammi Sou, MD  Admit date: 06/03/2014 Discharge date: 06/05/2014  Time spent: 40 minutes  Recommendations for Outpatient Follow-up:  1. Follow-up with primary care physician in 1-2 weeks  Discharge Diagnoses:  Principal Problem:   Acute respiratory failure with hypoxia Active Problems:   Essential hypertension   Chronic low back pain   Hyperlipidemia, mixed   Pneumonia   Sepsis   Acute kidney injury   Acute respiratory failure   Community acquired pneumonia   Discharge Condition: improved  Diet recommendation: heart healthy  Filed Weights   06/03/14 1611 06/04/14 0500  Weight: 125 kg (275 lb 9.2 oz) 125 kg (275 lb 9.2 oz)    History of present illness:  This patient was presented to the hospital with progressive shortness of breath, fever, cough. He was found have communicated acquired pneumonia, sepsis and acute renal failure. He was admitted for further treatments.  Hospital Course:  Patient was started on ceftriaxone and azithromycin. He was married to the stepdown unit for further treatments. With IV fluids, his condition quickly improved. He has been afebrile through his hospital course and his leukocytosis has resolved. He is now been weaned down to room air and ambulating without difficulty. He'll be transitioned to oral Levaquin to complete his antibiotic course. With IV fluids, his acute renal failure has resolved, this was likely related to dehydration. He's been advised to keep his appointment with his primary care physician in the next 1 to 2 weeks. He is ambulating around the nursing unit without difficulty and appears ready for discharge home.  Procedures:    Consultations:    Discharge Exam: Filed Vitals:   06/05/14 1510  BP: 134/75  Pulse: 87  Temp: 98.6 F (37 C)  Resp: 18    General: NAD Cardiovascular: S1, S2 RRR Respiratory: CTA  B  Discharge Instructions   Discharge Instructions    Diet - low sodium heart healthy    Complete by:  As directed      Increase activity slowly    Complete by:  As directed           Discharge Medication List as of 06/05/2014  6:01 PM    START taking these medications   Details  guaiFENesin (MUCINEX) 600 MG 12 hr tablet Take 2 tablets (1,200 mg total) by mouth 2 (two) times daily., Starting 06/05/2014, Until Discontinued, Normal    levofloxacin (LEVAQUIN) 750 MG tablet Take 1 tablet (750 mg total) by mouth daily., Starting 06/05/2014, Until Discontinued, Normal      CONTINUE these medications which have NOT CHANGED   Details  aspirin EC 81 MG tablet Take 81 mg by mouth every morning., Until Discontinued, Historical Med    atorvastatin (LIPITOR) 20 MG tablet TAKE 1 TABLET BY MOUTH ONCE DAILY., Normal    clonazePAM (KLONOPIN) 1 MG tablet Take 1 tablet (1 mg total) by mouth 2 (two) times daily., Starting 03/25/2014, Until Discontinued, Print    FIBER PO Take 2 tablets by mouth daily., Until Discontinued, Historical Med    metoprolol succinate (TOPROL-XL) 100 MG 24 hr tablet TAKE ONE TABLET BY MOUTH ONCE DAILY WITH OR IMMEDIATELY FOLLOWING A MEAL., Normal    morphine (MS CONTIN) 30 MG 12 hr tablet Take 1 tablet (30 mg total) by mouth every 8 (eight) hours., Starting 03/02/2014, Until Discontinued, Print    Multiple Vitamin (MULTIVITAMIN WITH MINERALS) TABS tablet Take 1 tablet by mouth daily., Until  Discontinued, Historical Med    Oxycodone HCl 10 MG TABS Take 1 tablet (10 mg total) by mouth 3 (three) times daily as needed (for pain)., Starting 03/02/2014, Until Discontinued, Print    pantoprazole (PROTONIX) 40 MG tablet TAKE 1 TABLET BY MOUTH ONCE DAILY., Normal    promethazine (PHENERGAN) 12.5 MG suppository Place 1 suppository (12.5 mg total) rectally every 6 (six) hours as needed for nausea or vomiting., Starting 03/02/2014, Until Discontinued, Normal    sucralfate (CARAFATE) 1  G tablet Take 1 g by mouth 2 (two) times daily as needed (Stomach Pain). , Starting 03/28/2012, Until Discontinued, Historical Med      STOP taking these medications     hydrochlorothiazide (HYDRODIURIL) 25 MG tablet      lisinopril (PRINIVIL,ZESTRIL) 10 MG tablet      promethazine (PHENERGAN) 12.5 MG tablet        No Known Allergies Follow-up Information    Follow up with MCGOWEN,PHILIP H, MD. Schedule an appointment as soon as possible for a visit in 2 weeks.   Specialty:  Family Medicine   Contact information:   [id number] [place] [address] [phone]        The results of significant diagnostics from this hospitalization (including imaging, microbiology, ancillary and laboratory) are listed below for reference.    Significant Diagnostic Studies: Ct Chest Wo Contrast  06/03/2014   CLINICAL DATA:  Cough for 7 days with intermittent fever for 2-3 days. Shortness of breath and low O2 sats, initial encounter.  EXAM: CT CHEST WITHOUT CONTRAST  TECHNIQUE: Multidetector CT imaging of the chest was performed following the standard protocol without IV contrast.  COMPARISON:  Chest radiograph 06/03/2014.  FINDINGS: Mediastinum/Nodes: No pathologically enlarged mediastinal or axillary lymph nodes. Hilar regions are difficult to definitively evaluate without IV contrast pulmonary arteries are enlarged. Three-vessel coronary artery calcification. Heart size normal. No pericardial effusion. Pre pericardiac lymph node is sub cm in short axis size. Incidental note is made of bilateral gynecomastia.  Lungs/Pleura: Scattered ill-defined peribronchovascular nodularity and consolidation bilaterally. No pleural fluid. Airway is unremarkable.  Upper abdomen: Visualized portions of the liver, gallbladder, right adrenal gland, spleen, pancreas, stomach and bowel are grossly unremarkable. No upper abdominal adenopathy.  Musculoskeletal: No worrisome lytic or sclerotic lesions. Degenerative  changes are seen in the spine.  IMPRESSION: 1. Multilobar peribronchovascular nodularity and consolidation, indicative of an infectious bronchiolitis/bronchopneumonia. 2. Enlarged pulmonary arteries, indicative of pulmonary arterial hypertension. 3. Three-vessel coronary artery calcification.   Electronically Signed   By: Lorin Picket M.D.   On: 06/03/2014 12:44   Dg Chest Portable 1 View  06/03/2014   CLINICAL DATA:  Cough for 7 days with intermittent fever for 2-3 days. Shortness of breath and low O2 sats. initial encounter.  EXAM: PORTABLE CHEST - 1 VIEW  COMPARISON:  12/22/2011.  FINDINGS: Trachea is midline. Heart size stable. Lungs are clear. No pleural fluid.  IMPRESSION: No acute findings.   Electronically Signed   By: Lorin Picket M.D.   On: 06/03/2014 10:31    Microbiology: Recent Results (from the past 240 hour(s))  Blood culture (routine x 2)     Status: None (Preliminary result)   Collection Time: 06/03/14 11:51 AM  Result Value Ref Range Status   Specimen Description BLOOD LEFT HAND  Final   Special Requests   Final    BOTTLES DRAWN AEROBIC AND ANAEROBIC AEB=8CC  ANA=6CC   Culture NO GROWTH 2 DAYS  Final   Report Status  PENDING  Incomplete  Blood culture (routine x 2)     Status: None (Preliminary result)   Collection Time: 06/03/14 12:00 PM  Result Value Ref Range Status   Specimen Description BLOOD LEFT ANTECUBITAL  Final   Special Requests   Final    BOTTLES DRAWN AEROBIC AND ANAEROBIC AEB=8CC ANA=6CC   Culture NO GROWTH 2 DAYS  Final   Report Status PENDING  Incomplete  MRSA PCR Screening     Status: None   Collection Time: 06/03/14  3:40 PM  Result Value Ref Range Status   MRSA by PCR NEGATIVE NEGATIVE Final    Comment:        The GeneXpert MRSA Assay (FDA approved for NASAL specimens only), is one component of a comprehensive MRSA colonization surveillance program. It is not intended to diagnose MRSA infection nor to guide or monitor treatment for MRSA  infections.   Culture, sputum-assessment     Status: None   Collection Time: 06/03/14  4:30 PM  Result Value Ref Range Status   Specimen Description SPUTUM EXPECTORATED  Final   Special Requests NONE  Final   Sputum evaluation   Final    THIS SPECIMEN IS ACCEPTABLE. RESPIRATORY CULTURE REPORT TO FOLLOW. Performed at [facility] [place]    Report Status 06/03/2014 FINAL  Final  Culture, respiratory (NON-Expectorated)     Status: None (Preliminary result)   Collection Time: 06/03/14  4:30 PM  Result Value Ref Range Status   Specimen Description SPUTUM EXPECTORATED  Final   Special Requests NONE  Final   Gram Stain   Final    ABUNDANT WBC PRESENT, PREDOMINANTLY PMN FEW SQUAMOUS EPITHELIAL CELLS PRESENT FEW GRAM POSITIVE COCCI IN PAIRS RARE GRAM VARIABLE ROD Performed at [facility]    Culture   Final    NORMAL OROPHARYNGEAL FLORA Performed at [facility]    Report Status PENDING  Incomplete     Labs: Basic Metabolic Panel:  Recent Labs Lab 06/03/14 1032 06/04/14 0517 06/05/14 0554  NA 131* 133* 140  K 4.7 3.9 3.7  CL 89* 97 98  CO2 34* 31 35*  GLUCOSE 129* 98 108*  BUN 44* 29* 16  CREATININE 2.92* 1.37* 1.00  CALCIUM 8.7 7.8* 8.3*   Liver Function Tests:  Recent Labs Lab 06/03/14 1200  AST 20  ALT 26  ALKPHOS 51  BILITOT 0.5  PROT 6.3  ALBUMIN 3.1*   No results for input(s): LIPASE, AMYLASE in the last 168 hours. No results for input(s): AMMONIA in the last 168 hours. CBC:  Recent Labs Lab 06/03/14 1032 06/04/14 0517  WBC 13.0* 8.4  NEUTROABS 10.0*  --   HGB 14.0 11.9*  HCT 42.5 35.7*  MCV 97.5 96.2  PLT 184 155   Cardiac Enzymes: No results for input(s): CKTOTAL, CKMB, CKMBINDEX, TROPONINI in the last 168 hours. BNP: BNP (last 3 results)  Recent Labs  06/03/14 1200  BNP 189.0*    ProBNP (last 3 results) No results for input(s): PROBNP in the last 8760 hours.  CBG: No results for input(s): GLUCAP in the last 168  hours.     Signed:  MEMON,JEHANZEB  [facility] 06/05/2014, 8:08 PM

## 2014-06-05 NOTE — Progress Notes (Signed)
Patient in stable condition at discharge.  Patient received education/scripts and had no further questions or concerns.  Patient escorted to main entrance via wheelchair by nurse tech.

## 2014-06-07 LAB — CULTURE, RESPIRATORY: CULTURE: NORMAL

## 2014-06-07 LAB — CULTURE, RESPIRATORY W GRAM STAIN

## 2014-06-08 ENCOUNTER — Encounter: Payer: Self-pay | Admitting: Family Medicine

## 2014-06-08 LAB — CULTURE, BLOOD (ROUTINE X 2)
Culture: NO GROWTH
Culture: NO GROWTH

## 2014-06-11 ENCOUNTER — Ambulatory Visit (INDEPENDENT_AMBULATORY_CARE_PROVIDER_SITE_OTHER): Payer: Medicare Other | Admitting: Family Medicine

## 2014-06-11 ENCOUNTER — Encounter: Payer: Self-pay | Admitting: Family Medicine

## 2014-06-11 VITALS — BP 120/64 | HR 78 | Temp 98.3°F | Ht 71.0 in | Wt 263.0 lb

## 2014-06-11 DIAGNOSIS — N182 Chronic kidney disease, stage 2 (mild): Secondary | ICD-10-CM

## 2014-06-11 DIAGNOSIS — I10 Essential (primary) hypertension: Secondary | ICD-10-CM

## 2014-06-11 DIAGNOSIS — J189 Pneumonia, unspecified organism: Secondary | ICD-10-CM

## 2014-06-11 DIAGNOSIS — K1230 Oral mucositis (ulcerative), unspecified: Secondary | ICD-10-CM

## 2014-06-11 DIAGNOSIS — R7989 Other specified abnormal findings of blood chemistry: Secondary | ICD-10-CM

## 2014-06-11 DIAGNOSIS — G894 Chronic pain syndrome: Secondary | ICD-10-CM

## 2014-06-11 MED ORDER — OXYCODONE HCL 10 MG PO TABS: 10.0000 mg | ORAL_TABLET | Freq: Three times a day (TID) | ORAL | Status: DC | PRN

## 2014-06-11 MED ORDER — MORPHINE SULFATE ER 30 MG PO TBCR: 30.0000 mg | EXTENDED_RELEASE_TABLET | Freq: Three times a day (TID) | ORAL | Status: DC

## 2014-06-11 MED ORDER — MORPHINE SULFATE ER 30 MG PO TBCR
30.0000 mg | EXTENDED_RELEASE_TABLET | Freq: Three times a day (TID) | ORAL | Status: DC
Start: 2014-06-11 — End: 2014-06-11

## 2014-06-11 MED ORDER — FIRST-DUKES MOUTHWASH MT SUSP: OROMUCOSAL | Status: DC

## 2014-06-11 NOTE — Progress Notes (Signed)
Pre visit review using our clinic review tool, if applicable. No additional management support is needed unless otherwise documented below in the visit note. 

## 2014-06-11 NOTE — Progress Notes (Signed)
OFFICE NOTE  06/11/2014  CC:  Chief Complaint  Patient presents with  . Hospitalization Follow-up     HPI: Patient is a 64 y.o. Caucasian male who is here with wife today for hospital f/u: admitted to [facility] 3/9-3/11, 2016 for CAP. Was treated with a course of levaquin after initially being on rocephin and azith when first admitted.  He required oxygen supplementation briefly, also had ARF which resolved with IVF.  At d/c, he was instructed to HOLD his lisinopril 10mg  qd and his HCTZ 25mg  qd. He feels like everything is back to normal except he feels very fatigued still. BPs at home since d/c has been 120s over 60s.  Feels a bit of discomfort diffusely in mouth, sensitive, asks for [facility] magic mouthwash.   Pertinent PMH:  Past medical, surgical, social, and family history reviewed and no changes are noted since last office visit.  MEDS:  Outpatient Prescriptions Prior to Visit  Medication Sig Dispense Refill  . aspirin EC 81 MG tablet Take 81 mg by mouth every morning.    Marland Kitchen atorvastatin (LIPITOR) 20 MG tablet TAKE 1 TABLET BY MOUTH ONCE DAILY. 90 tablet 1  . clonazePAM (KLONOPIN) 1 MG tablet Take 1 tablet (1 mg total) by mouth 2 (two) times daily. 60 tablet 5  . FIBER PO Take 2 tablets by mouth daily.    Marland Kitchen guaiFENesin (MUCINEX) 600 MG 12 hr tablet Take 2 tablets (1,200 mg total) by mouth 2 (two) times daily. 20 tablet 0  . metoprolol succinate (TOPROL-XL) 100 MG 24 hr tablet TAKE ONE TABLET BY MOUTH ONCE DAILY WITH OR IMMEDIATELY FOLLOWING A MEAL. 90 tablet 1  . morphine (MS CONTIN) 30 MG 12 hr tablet Take 1 tablet (30 mg total) by mouth every 8 (eight) hours. 90 tablet 0  . Multiple Vitamin (MULTIVITAMIN WITH MINERALS) TABS tablet Take 1 tablet by mouth daily.    . Oxycodone HCl 10 MG TABS Take 1 tablet (10 mg total) by mouth 3 (three) times daily as needed (for pain). 90 tablet 0  . pantoprazole (PROTONIX) 40 MG tablet TAKE 1 TABLET BY MOUTH ONCE DAILY. 90 tablet 1  . promethazine  (PHENERGAN) 12.5 MG suppository Place 1 suppository (12.5 mg total) rectally every 6 (six) hours as needed for nausea or vomiting. 12 each 1  . sucralfate (CARAFATE) 1 G tablet Take 1 g by mouth 2 (two) times daily as needed (Stomach Pain).     Marland Kitchen levofloxacin (LEVAQUIN) 750 MG tablet Take 1 tablet (750 mg total) by mouth daily. (Patient not taking: Reported on 06/11/2014) 5 tablet 0   No facility-administered medications prior to visit.    PE: Blood pressure 120/64, pulse 78, temperature 98.3 F (36.8 C), temperature source Oral, height 5\' 11"  (1.803 m), weight 263 lb (119.296 kg), SpO2 94 %. Gen: Alert, well appearing.  Patient is oriented to person, place, time, and situation. Mouth: no erythema, swelling, exudate, or lesion. Neck - No masses or thyromegaly or limitation in range of motion CV: RRR, distant S1 and S2, no audible murmur/rub/gallop Chest is clear, no wheezing or rales. Normal symmetric air entry throughout both lung fields. No chest wall deformities or tenderness. EXT: 2+ pitting edema bilat LE's (pt and wife say this came as he was given lots of IVF's in hosp and has been gradually improving since d/c)  LABS:    Chemistry      Component Value Date/Time   NA 140 06/05/2014 0554   K 3.7 06/05/2014 0554  CL 98 06/05/2014 0554   CO2 35* 06/05/2014 0554   BUN 16 06/05/2014 0554   CREATININE 1.00 06/05/2014 0554      Component Value Date/Time   CALCIUM 8.3* 06/05/2014 0554   ALKPHOS 51 06/03/2014 1200   AST 20 06/03/2014 1200   ALT 26 06/03/2014 1200   BILITOT 0.5 06/03/2014 1200     Lab Results  Component Value Date   WBC 8.4 06/04/2014   HGB 11.9* 06/04/2014   HCT 35.7* 06/04/2014   MCV 96.2 06/04/2014   PLT 155 06/04/2014     IMPRESSION AND PLAN:  1) CAP, recovering appropriately. Encouraged gradual return to normal activities, gave pneumovax 23 and flu vaccines today. At f/u in 1 mo, if he is still feeling no resp sx's and his energy has returned to  normal, we'll hold off of any repeat imaging (CT chest).  2) HTN; controlled solely on toprol xl at this time. Continue to monitor at home daily, call if persistently back up above 160/100, otherwise we'll go over numbers at f/u in 1 mo. May need to eventually get back on lisinopril and HCTZ.  3) ARF on CRI stage 2: resolved with fluids in hosp.  No need for repeat labs at this time.  4) Chronic pain syndrome: stable. I printed rx's for MS Contin 30mg , 1 tid, #90 and oxycodone 10mg , 1 tid prn breakthrough pain, #90 today for this month and April 2016.  Appropriate fill on/after date was noted on each rx. Of note, he gave me back an unfilled paper rx for oxycodone 10mg  dated 02/2014 for oxycodone today and I shredded this.  5) Mucositis: mouth exam normal but he describes sx's consistent with this so I rx'd [facility] magic mouthwash to use 96ml tid prn.  An After Visit Summary was printed and given to the patient.  FOLLOW UP: 4 wks f/u CAP and HTN.

## 2014-08-10 ENCOUNTER — Encounter: Payer: Self-pay | Admitting: Family Medicine

## 2014-08-10 ENCOUNTER — Ambulatory Visit (INDEPENDENT_AMBULATORY_CARE_PROVIDER_SITE_OTHER): Payer: Medicare Other | Admitting: Family Medicine

## 2014-08-10 VITALS — BP 128/85 | HR 75 | Temp 98.1°F | Resp 16 | Ht 69.5 in | Wt 260.0 lb

## 2014-08-10 DIAGNOSIS — E119 Type 2 diabetes mellitus without complications: Secondary | ICD-10-CM

## 2014-08-10 DIAGNOSIS — G894 Chronic pain syndrome: Secondary | ICD-10-CM

## 2014-08-10 DIAGNOSIS — I10 Essential (primary) hypertension: Secondary | ICD-10-CM

## 2014-08-10 DIAGNOSIS — Z125 Encounter for screening for malignant neoplasm of prostate: Secondary | ICD-10-CM | POA: Diagnosis not present

## 2014-08-10 DIAGNOSIS — Z0000 Encounter for general adult medical examination without abnormal findings: Secondary | ICD-10-CM | POA: Diagnosis not present

## 2014-08-10 LAB — PSA: PSA: 0.58 ng/mL (ref 0.10–4.00)

## 2014-08-10 LAB — TSH: TSH: 0.61 u[IU]/mL (ref 0.35–4.50)

## 2014-08-10 MED ORDER — MORPHINE SULFATE ER 30 MG PO TBCR: 30.0000 mg | EXTENDED_RELEASE_TABLET | Freq: Three times a day (TID) | ORAL | Status: DC

## 2014-08-10 MED ORDER — OXYCODONE HCL 10 MG PO TABS: 10.0000 mg | ORAL_TABLET | Freq: Three times a day (TID) | ORAL | Status: DC | PRN

## 2014-08-10 MED ORDER — ZOSTER VACCINE LIVE 19400 UNT/0.65ML ~~LOC~~ SOLR: 0.6500 mL | Freq: Once | SUBCUTANEOUS | Status: DC

## 2014-08-10 NOTE — Progress Notes (Signed)
Pre visit review using our clinic review tool, if applicable. No additional management support is needed unless otherwise documented below in the visit note. 
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
## 2014-09-10 ENCOUNTER — Other Ambulatory Visit: Payer: Self-pay | Admitting: *Deleted

## 2014-09-10 MED ORDER — CLONAZEPAM 1 MG PO TABS: 1.0000 mg | ORAL_TABLET | Freq: Two times a day (BID) | ORAL | Status: DC

## 2014-09-10 NOTE — Telephone Encounter (Signed)
Pt LMOM requesting refill for Clonazepam. LOV 08/10/14, up coming ov 12/07/14, last written: 03/25/14 #60 w/ 5RF.

## 2014-10-02 ENCOUNTER — Ambulatory Visit: Payer: Medicare Other | Admitting: Nutrition

## 2014-10-02 ENCOUNTER — Telehealth: Payer: Self-pay | Admitting: Nutrition

## 2014-10-02 NOTE — Telephone Encounter (Signed)
Left VM to call back to reschedule missed appointment. PC

## 2014-10-12 ENCOUNTER — Other Ambulatory Visit: Payer: Self-pay | Admitting: Family Medicine

## 2014-10-12 NOTE — Telephone Encounter (Signed)
RF request for hctz - pt no longer taking, request denied.   RF request for metoprol  LOV: 08/10/14 Next ov: 12/07/14 Last written: 12/23/13 #90 w/ 1RF  RF request for pantoprasol LOV: 08/10/14 Next ov: 12/07/14 Last written: 04/13/14 #90 w/ 1RF

## 2014-10-14 ENCOUNTER — Other Ambulatory Visit: Payer: Self-pay | Admitting: Family Medicine

## 2014-10-14 NOTE — Telephone Encounter (Signed)
Returned Heather's phone call in reference to his med. Refill Please call at [phone].

## 2014-10-14 NOTE — Telephone Encounter (Signed)
RF requesting for hctz. LOV: 12/07/14 NOV: 08/10/14 Last written: Unknown Please advise. Thanks.

## 2014-10-14 NOTE — Telephone Encounter (Signed)
Is pt suppose to be taking this medications?  Please advise. Thanks.  Left detailed message for pt to call back, to see if he is requesting RF or it this is an automated request.

## 2014-11-03 ENCOUNTER — Encounter: Payer: Self-pay | Admitting: Family Medicine

## 2014-11-03 ENCOUNTER — Ambulatory Visit (INDEPENDENT_AMBULATORY_CARE_PROVIDER_SITE_OTHER): Payer: Medicare Other | Admitting: Family Medicine

## 2014-11-03 VITALS — BP 129/86 | HR 59 | Temp 98.3°F | Resp 16 | Ht 69.5 in | Wt 259.0 lb

## 2014-11-03 DIAGNOSIS — G894 Chronic pain syndrome: Secondary | ICD-10-CM | POA: Diagnosis not present

## 2014-11-03 DIAGNOSIS — E119 Type 2 diabetes mellitus without complications: Secondary | ICD-10-CM | POA: Insufficient documentation

## 2014-11-03 DIAGNOSIS — E782 Mixed hyperlipidemia: Secondary | ICD-10-CM

## 2014-11-03 DIAGNOSIS — M170 Bilateral primary osteoarthritis of knee: Secondary | ICD-10-CM

## 2014-11-03 DIAGNOSIS — I10 Essential (primary) hypertension: Secondary | ICD-10-CM

## 2014-11-03 DIAGNOSIS — F411 Generalized anxiety disorder: Secondary | ICD-10-CM

## 2014-11-03 MED ORDER — CLONAZEPAM 1 MG PO TABS: 1.0000 mg | ORAL_TABLET | Freq: Two times a day (BID) | ORAL | Status: DC | PRN

## 2014-11-03 MED ORDER — OXYCODONE HCL 10 MG PO TABS: 10.0000 mg | ORAL_TABLET | Freq: Three times a day (TID) | ORAL | Status: DC | PRN

## 2014-11-03 MED ORDER — MORPHINE SULFATE ER 30 MG PO TBCR: 30.0000 mg | EXTENDED_RELEASE_TABLET | Freq: Three times a day (TID) | ORAL | Status: DC

## 2014-11-03 MED ORDER — LISINOPRIL 10 MG PO TABS: 10.0000 mg | ORAL_TABLET | Freq: Every day | ORAL | Status: DC
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
## 2014-11-03 NOTE — Progress Notes (Signed)
Pre visit review using our clinic review tool, if applicable. No additional management support is needed unless otherwise documented below in the visit note. 

## 2014-12-07 ENCOUNTER — Ambulatory Visit: Payer: Medicare Other | Admitting: Family Medicine

## 2015-01-12 ENCOUNTER — Other Ambulatory Visit: Payer: Self-pay | Admitting: Family Medicine

## 2015-01-12 NOTE — Telephone Encounter (Signed)
LOV: 11/03/14 NOV: None, was advised to come back in one month at [facility] for f/u  RF request for pantoprazole Last written: 10/12/14 #90 w/ 1RF  RF request for metoprolol Last written: 10/12/14 #90 w/ 1RF  Spoke to pt and advised hime that he will need ov with Dr. Anitra Lauth. Apt made for 01/29/15 at 8:15am.

## 2015-01-27 ENCOUNTER — Encounter: Payer: Self-pay | Admitting: Family Medicine

## 2015-01-27 ENCOUNTER — Ambulatory Visit (INDEPENDENT_AMBULATORY_CARE_PROVIDER_SITE_OTHER): Payer: Medicare Other | Admitting: Family Medicine

## 2015-01-27 VITALS — BP 107/77 | HR 70 | Temp 98.9°F | Resp 16 | Ht 69.5 in | Wt 258.0 lb

## 2015-01-27 DIAGNOSIS — I10 Essential (primary) hypertension: Secondary | ICD-10-CM

## 2015-01-27 DIAGNOSIS — Z23 Encounter for immunization: Secondary | ICD-10-CM | POA: Diagnosis not present

## 2015-01-27 DIAGNOSIS — G894 Chronic pain syndrome: Secondary | ICD-10-CM | POA: Diagnosis not present

## 2015-01-27 DIAGNOSIS — M170 Bilateral primary osteoarthritis of knee: Secondary | ICD-10-CM | POA: Diagnosis not present

## 2015-01-27 DIAGNOSIS — E119 Type 2 diabetes mellitus without complications: Secondary | ICD-10-CM

## 2015-01-27 DIAGNOSIS — E782 Mixed hyperlipidemia: Secondary | ICD-10-CM

## 2015-01-27 LAB — COMPREHENSIVE METABOLIC PANEL
ALBUMIN: 4.2 g/dL (ref 3.5–5.2)
ALK PHOS: 56 U/L (ref 39–117)
ALT: 18 U/L (ref 0–53)
AST: 18 U/L (ref 0–37)
BUN: 18 mg/dL (ref 6–23)
CALCIUM: 10.1 mg/dL (ref 8.4–10.5)
CO2: 33 mEq/L — ABNORMAL HIGH (ref 19–32)
Chloride: 96 mEq/L (ref 96–112)
Creatinine, Ser: 1.12 mg/dL (ref 0.40–1.50)
GFR: 70.55 mL/min (ref >60.00)
Glucose, Bld: 107 mg/dL — ABNORMAL HIGH (ref 70–99)
POTASSIUM: 4.3 meq/L (ref 3.5–5.1)
SODIUM: 137 meq/L (ref 135–145)
TOTAL PROTEIN: 7.4 g/dL (ref 6.0–8.3)
Total Bilirubin: 0.7 mg/dL (ref 0.2–1.2)

## 2015-01-27 LAB — LIPID PANEL
Cholesterol: 181 mg/dL (ref 0–200)
HDL: 40.3 mg/dL (ref >39.00)
LDL CALC: 106 mg/dL — AB (ref 0–99)
NONHDL: 140.77
Total CHOL/HDL Ratio: 4
Triglycerides: 174 mg/dL — ABNORMAL HIGH (ref 0.0–149.0)
VLDL: 34.8 mg/dL (ref 0.0–40.0)

## 2015-01-27 LAB — HEMOGLOBIN A1C: Hgb A1c MFr Bld: 5.8 % (ref 4.6–6.5)

## 2015-01-27 LAB — MICROALBUMIN / CREATININE URINE RATIO
CREATININE, U: 169.6 mg/dL
MICROALB UR: 1.1 mg/dL (ref 0.0–1.9)
MICROALB/CREAT RATIO: 0.6 mg/g (ref 0.0–30.0)

## 2015-01-27 MED ORDER — OXYCODONE HCL 10 MG PO TABS: 10.0000 mg | ORAL_TABLET | Freq: Three times a day (TID) | ORAL | Status: DC | PRN

## 2015-01-27 MED ORDER — MORPHINE SULFATE ER 30 MG PO TBCR: 30.0000 mg | EXTENDED_RELEASE_TABLET | Freq: Three times a day (TID) | ORAL | Status: DC
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
## 2015-01-27 NOTE — Progress Notes (Signed)
Pre visit review using our clinic review tool, if applicable. No additional management support is needed unless otherwise documented below in the visit note. 

## 2015-03-09 ENCOUNTER — Other Ambulatory Visit: Payer: Self-pay | Admitting: Family Medicine

## 2015-03-09 NOTE — Telephone Encounter (Signed)
RF request for clonazepam LOV: 01/27/15 Next ov: 04/28/15 Last written: 09/10/14 #60 w/ 5RF  Please advise. Thanks.

## 2015-04-28 ENCOUNTER — Ambulatory Visit (INDEPENDENT_AMBULATORY_CARE_PROVIDER_SITE_OTHER): Payer: Medicare Other | Admitting: Family Medicine

## 2015-04-28 ENCOUNTER — Encounter: Payer: Self-pay | Admitting: Family Medicine

## 2015-04-28 VITALS — BP 119/86 | HR 82 | Temp 98.2°F | Resp 16 | Ht 69.5 in | Wt 257.0 lb

## 2015-04-28 DIAGNOSIS — E119 Type 2 diabetes mellitus without complications: Secondary | ICD-10-CM

## 2015-04-28 DIAGNOSIS — M170 Bilateral primary osteoarthritis of knee: Secondary | ICD-10-CM | POA: Diagnosis not present

## 2015-04-28 DIAGNOSIS — M545 Low back pain, unspecified: Secondary | ICD-10-CM

## 2015-04-28 DIAGNOSIS — I10 Essential (primary) hypertension: Secondary | ICD-10-CM

## 2015-04-28 DIAGNOSIS — G8929 Other chronic pain: Secondary | ICD-10-CM

## 2015-04-28 DIAGNOSIS — E785 Hyperlipidemia, unspecified: Secondary | ICD-10-CM

## 2015-04-28 DIAGNOSIS — G894 Chronic pain syndrome: Secondary | ICD-10-CM

## 2015-04-28 MED ORDER — OXYCODONE HCL 10 MG PO TABS: 10.0000 mg | ORAL_TABLET | Freq: Three times a day (TID) | ORAL | Status: DC | PRN

## 2015-04-28 MED ORDER — MORPHINE SULFATE ER 30 MG PO TBCR
30.0000 mg | EXTENDED_RELEASE_TABLET | Freq: Three times a day (TID) | ORAL | Status: DC
Start: 2015-04-28 — End: 2015-07-26

## 2015-04-28 MED ORDER — MORPHINE SULFATE ER 30 MG PO TBCR: 30.0000 mg | EXTENDED_RELEASE_TABLET | Freq: Three times a day (TID) | ORAL | Status: DC

## 2015-04-28 MED ORDER — MORPHINE SULFATE ER 30 MG PO TBCR
30.0000 mg | EXTENDED_RELEASE_TABLET | Freq: Three times a day (TID) | ORAL | Status: DC
Start: 2015-04-28 — End: 2015-04-28
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
## 2015-04-28 NOTE — Progress Notes (Signed)
Pre visit review using our clinic review tool, if applicable. No additional management support is needed unless otherwise documented below in the visit note. 

## 2015-06-03 ENCOUNTER — Encounter: Payer: Self-pay | Admitting: Family Medicine

## 2015-07-15 ENCOUNTER — Ambulatory Visit (INDEPENDENT_AMBULATORY_CARE_PROVIDER_SITE_OTHER): Payer: Medicare Other | Admitting: Family Medicine

## 2015-07-15 ENCOUNTER — Encounter: Payer: Self-pay | Admitting: Family Medicine

## 2015-07-15 VITALS — BP 111/76 | HR 76 | Temp 98.1°F | Resp 16 | Ht 69.5 in | Wt 253.2 lb

## 2015-07-15 DIAGNOSIS — M25512 Pain in left shoulder: Secondary | ICD-10-CM | POA: Diagnosis not present

## 2015-07-15 DIAGNOSIS — M7582 Other shoulder lesions, left shoulder: Secondary | ICD-10-CM

## 2015-07-15 MED ORDER — METHYLPREDNISOLONE ACETATE 80 MG/ML IJ SUSP
80.0000 mg | Freq: Once | INTRAMUSCULAR | Status: AC
  Administered 2015-07-15: 80 mg via INTRAMUSCULAR

## 2015-07-15 NOTE — Progress Notes (Signed)
Pre visit review using our clinic review tool, if applicable. No additional management support is needed unless otherwise documented below in the visit note. 
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
## 2015-07-22 ENCOUNTER — Other Ambulatory Visit: Payer: Self-pay | Admitting: Family Medicine

## 2015-07-22 NOTE — Telephone Encounter (Signed)
LOV: 04/28/15 NOV: 07/26/15  RF request for hctz Last written: 10/14/14 #90 w/ 3RF  RF request for pantoprazole Last written: 01/12/15 #90 w/ [id number]  RF request for metoprolol Last written: 01/12/15 #90 w/ 0RF

## 2015-07-26 ENCOUNTER — Encounter: Payer: Self-pay | Admitting: Family Medicine

## 2015-07-26 ENCOUNTER — Ambulatory Visit (INDEPENDENT_AMBULATORY_CARE_PROVIDER_SITE_OTHER): Payer: Medicare Other | Admitting: Family Medicine

## 2015-07-26 VITALS — BP 121/81 | HR 65 | Temp 98.0°F | Resp 16 | Ht 69.5 in | Wt 250.5 lb

## 2015-07-26 DIAGNOSIS — M170 Bilateral primary osteoarthritis of knee: Secondary | ICD-10-CM | POA: Diagnosis not present

## 2015-07-26 DIAGNOSIS — E785 Hyperlipidemia, unspecified: Secondary | ICD-10-CM

## 2015-07-26 DIAGNOSIS — E119 Type 2 diabetes mellitus without complications: Secondary | ICD-10-CM | POA: Diagnosis not present

## 2015-07-26 DIAGNOSIS — G894 Chronic pain syndrome: Secondary | ICD-10-CM | POA: Diagnosis not present

## 2015-07-26 DIAGNOSIS — I10 Essential (primary) hypertension: Secondary | ICD-10-CM | POA: Diagnosis not present

## 2015-07-26 LAB — BASIC METABOLIC PANEL
BUN: 25 mg/dL — ABNORMAL HIGH (ref 6–23)
CHLORIDE: 96 meq/L (ref 96–112)
CO2: 32 mEq/L (ref 19–32)
CREATININE: 1.06 mg/dL (ref 0.40–1.50)
Calcium: 9.8 mg/dL (ref 8.4–10.5)
GFR: 75.06 mL/min (ref >60.00)
Glucose, Bld: 93 mg/dL (ref 70–99)
Potassium: 4.2 mEq/L (ref 3.5–5.1)
Sodium: 136 mEq/L (ref 135–145)

## 2015-07-26 LAB — HEMOGLOBIN A1C: Hgb A1c MFr Bld: 6.1 % (ref 4.6–6.5)

## 2015-07-26 MED ORDER — MORPHINE SULFATE ER 30 MG PO TBCR: 30.0000 mg | EXTENDED_RELEASE_TABLET | Freq: Three times a day (TID) | ORAL | Status: DC

## 2015-07-26 MED ORDER — OXYCODONE HCL 10 MG PO TABS: 10.0000 mg | ORAL_TABLET | Freq: Three times a day (TID) | ORAL | Status: DC | PRN
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
## 2015-07-26 NOTE — Progress Notes (Signed)
Pre visit review using our clinic review tool, if applicable. No additional management support is needed unless otherwise documented below in the visit note. 

## 2015-08-26 ENCOUNTER — Other Ambulatory Visit: Payer: Self-pay | Admitting: Family Medicine

## 2015-08-26 NOTE — Telephone Encounter (Signed)
Spoke to pharmacy.  Rx was faxed at 1:52pm.  Patient aware Rx will be ready for pick up.

## 2015-08-26 NOTE — Telephone Encounter (Signed)
RF request for clonazepam LOV: 07/26/15  Next ov: 10/29/15 Last written: 09/10/14 #60 w/ 5RF  Please advise.Thanks.

## 2015-08-26 NOTE — Telephone Encounter (Signed)
Rx faxed

## 2015-08-26 NOTE — Telephone Encounter (Signed)
Patient called to report he called his pharmacy([facility]) and was advised his refill for clonazePAM (KLONOPIN) 1 MG tablet was denied.  Patient is calling to inquire why refill request was denied.

## 2015-10-14 ENCOUNTER — Encounter: Payer: Self-pay | Admitting: Family Medicine

## 2015-10-14 ENCOUNTER — Encounter (INDEPENDENT_AMBULATORY_CARE_PROVIDER_SITE_OTHER): Payer: Self-pay | Admitting: *Deleted

## 2015-10-14 ENCOUNTER — Ambulatory Visit (INDEPENDENT_AMBULATORY_CARE_PROVIDER_SITE_OTHER): Payer: Medicare Other | Admitting: Family Medicine

## 2015-10-14 VITALS — BP 104/76 | HR 74 | Temp 98.3°F | Resp 16 | Ht 70.0 in | Wt 250.5 lb

## 2015-10-14 DIAGNOSIS — Z125 Encounter for screening for malignant neoplasm of prostate: Secondary | ICD-10-CM

## 2015-10-14 DIAGNOSIS — E782 Mixed hyperlipidemia: Secondary | ICD-10-CM | POA: Diagnosis not present

## 2015-10-14 DIAGNOSIS — Z23 Encounter for immunization: Secondary | ICD-10-CM | POA: Diagnosis not present

## 2015-10-14 DIAGNOSIS — N182 Chronic kidney disease, stage 2 (mild): Secondary | ICD-10-CM | POA: Diagnosis not present

## 2015-10-14 DIAGNOSIS — Z1211 Encounter for screening for malignant neoplasm of colon: Secondary | ICD-10-CM

## 2015-10-14 DIAGNOSIS — G894 Chronic pain syndrome: Secondary | ICD-10-CM

## 2015-10-14 DIAGNOSIS — Z0000 Encounter for general adult medical examination without abnormal findings: Secondary | ICD-10-CM | POA: Insufficient documentation

## 2015-10-14 DIAGNOSIS — I10 Essential (primary) hypertension: Secondary | ICD-10-CM

## 2015-10-14 LAB — COMPREHENSIVE METABOLIC PANEL
ALT: 19 U/L (ref 0–53)
AST: 17 U/L (ref 0–37)
Albumin: 4.2 g/dL (ref 3.5–5.2)
Alkaline Phosphatase: 51 U/L (ref 39–117)
BUN: 20 mg/dL (ref 6–23)
CALCIUM: 9.6 mg/dL (ref 8.4–10.5)
CHLORIDE: 98 meq/L (ref 96–112)
CO2: 31 meq/L (ref 19–32)
CREATININE: 1.16 mg/dL (ref 0.40–1.50)
GFR: 67.59 mL/min (ref >60.00)
Glucose, Bld: 96 mg/dL (ref 70–99)
POTASSIUM: 4 meq/L (ref 3.5–5.1)
Sodium: 139 mEq/L (ref 135–145)
Total Bilirubin: 0.5 mg/dL (ref 0.2–1.2)
Total Protein: 7.3 g/dL (ref 6.0–8.3)

## 2015-10-14 LAB — TSH: TSH: 1.83 u[IU]/mL (ref 0.35–4.50)

## 2015-10-14 LAB — CBC WITH DIFFERENTIAL/PLATELET
BASOS ABS: 0.1 10*3/uL (ref 0.0–0.1)
Basophils Relative: 0.9 % (ref 0.0–3.0)
EOS ABS: 0.3 10*3/uL (ref 0.0–0.7)
Eosinophils Relative: 3.6 % (ref 0.0–5.0)
HCT: 45.3 % (ref 39.0–52.0)
Hemoglobin: 15.3 g/dL (ref 13.0–17.0)
LYMPHS ABS: 2.2 10*3/uL (ref 0.7–4.0)
Lymphocytes Relative: 30.3 % (ref 12.0–46.0)
MCHC: 33.7 g/dL (ref 30.0–36.0)
MCV: 95.8 fl (ref 78.0–100.0)
MONO ABS: 0.8 10*3/uL (ref 0.1–1.0)
Monocytes Relative: 10.8 % (ref 3.0–12.0)
NEUTROS PCT: 54.4 % (ref 43.0–77.0)
Neutro Abs: 3.9 10*3/uL (ref 1.4–7.7)
PLATELETS: 240 10*3/uL (ref 150.0–400.0)
RBC: 4.73 Mil/uL (ref 4.22–5.81)
RDW: 13.4 % (ref 11.5–15.5)
WBC: 7.3 10*3/uL (ref 4.0–10.5)

## 2015-10-14 LAB — LIPID PANEL
CHOL/HDL RATIO: 5
CHOLESTEROL: 212 mg/dL — AB (ref 0–200)
HDL: 43.9 mg/dL (ref >39.00)
NonHDL: 168.34
Triglycerides: 223 mg/dL — ABNORMAL HIGH (ref 0.0–149.0)
VLDL: 44.6 mg/dL — AB (ref 0.0–40.0)

## 2015-10-14 LAB — PSA, MEDICARE: PSA: 0.6 ng/ml (ref 0.10–4.00)

## 2015-10-14 LAB — LDL CHOLESTEROL, DIRECT: Direct LDL: 145 mg/dL

## 2015-10-14 MED ORDER — OXYCODONE HCL 10 MG PO TABS: 10.0000 mg | ORAL_TABLET | Freq: Three times a day (TID) | ORAL | Status: DC | PRN

## 2015-10-14 MED ORDER — MORPHINE SULFATE ER 30 MG PO TBCR: 30.0000 mg | EXTENDED_RELEASE_TABLET | Freq: Three times a day (TID) | ORAL | Status: DC

## 2015-10-14 NOTE — Progress Notes (Signed)
Pre visit review using our clinic review tool, if applicable. No additional management support is needed unless otherwise documented below in the visit note. 
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
## 2015-10-14 NOTE — Addendum Note (Signed)
Addended by: Onalee Hua on: 10/14/2015 09:34 AM   Modules accepted: Orders, SmartSet

## 2015-10-15 ENCOUNTER — Other Ambulatory Visit: Payer: Self-pay | Admitting: *Deleted

## 2015-10-15 MED ORDER — ATORVASTATIN CALCIUM 40 MG PO TABS: 40.0000 mg | ORAL_TABLET | Freq: Every day | ORAL | Status: DC

## 2015-10-18 ENCOUNTER — Other Ambulatory Visit: Payer: Self-pay | Admitting: Family Medicine

## 2015-10-20 ENCOUNTER — Other Ambulatory Visit: Payer: Self-pay | Admitting: Family Medicine

## 2015-10-20 ENCOUNTER — Encounter: Payer: Medicare Other | Admitting: Family Medicine

## 2015-10-20 NOTE — Telephone Encounter (Signed)
RF request for promethazine LOV: 10/14/15 Next ov: None Last written: 03/02/14 #12 w/ 1RF

## 2015-10-21 ENCOUNTER — Other Ambulatory Visit: Payer: Self-pay | Admitting: Family Medicine

## 2015-10-29 ENCOUNTER — Encounter: Payer: Medicare Other | Admitting: Family Medicine

## 2015-11-09 ENCOUNTER — Encounter: Payer: Self-pay | Admitting: Family Medicine

## 2016-01-13 ENCOUNTER — Other Ambulatory Visit (INDEPENDENT_AMBULATORY_CARE_PROVIDER_SITE_OTHER): Payer: Self-pay | Admitting: *Deleted

## 2016-01-13 ENCOUNTER — Telehealth (INDEPENDENT_AMBULATORY_CARE_PROVIDER_SITE_OTHER): Payer: Self-pay | Admitting: *Deleted

## 2016-01-13 ENCOUNTER — Encounter (INDEPENDENT_AMBULATORY_CARE_PROVIDER_SITE_OTHER): Payer: Self-pay | Admitting: *Deleted

## 2016-01-13 DIAGNOSIS — Z1211 Encounter for screening for malignant neoplasm of colon: Secondary | ICD-10-CM | POA: Insufficient documentation

## 2016-01-13 MED ORDER — PEG 3350-KCL-NA BICARB-NACL 420 G PO SOLR: 4000.0000 mL | Freq: Once | ORAL | 0 refills | Status: AC

## 2016-01-13 NOTE — Telephone Encounter (Signed)
Referring MD/PCP: [email or web address]   Procedure: tcs with propofol  Reason/Indication:  screening  Has patient had this procedure before?  9-10 yrs ago  If so, when, by whom and where?    Is there a family history of colon cancer?  no  Who?  What age when diagnosed?    Is patient diabetic?   no      Does patient have prosthetic heart valve or mechanical valve?  no  Do you have a pacemaker?  no  Has patient ever had endocarditis? no  Has patient had joint replacement within last 12 months?  no  Does patient tend to be constipated or take laxatives? no  Does patient have a history of alcohol/drug use?  no  Is patient on Coumadin, Plavix and/or Aspirin? yes  Medications: see epic  Allergies: nkda  Medication Adjustment: asa 2 days  Procedure date & time: 11/31/17 at 1250 -- preop 10/31 @ 11

## 2016-01-13 NOTE — Telephone Encounter (Signed)
Patient needs trilyte 

## 2016-01-13 NOTE — Telephone Encounter (Signed)
agree

## 2016-01-20 ENCOUNTER — Ambulatory Visit (INDEPENDENT_AMBULATORY_CARE_PROVIDER_SITE_OTHER): Payer: Medicare Other | Admitting: Family Medicine

## 2016-01-20 ENCOUNTER — Encounter: Payer: Self-pay | Admitting: Family Medicine

## 2016-01-20 VITALS — BP 121/82 | HR 79 | Temp 98.4°F | Resp 18 | Wt 261.0 lb

## 2016-01-20 DIAGNOSIS — G894 Chronic pain syndrome: Secondary | ICD-10-CM

## 2016-01-20 DIAGNOSIS — M150 Primary generalized (osteo)arthritis: Secondary | ICD-10-CM

## 2016-01-20 DIAGNOSIS — I10 Essential (primary) hypertension: Secondary | ICD-10-CM | POA: Diagnosis not present

## 2016-01-20 DIAGNOSIS — M159 Polyosteoarthritis, unspecified: Secondary | ICD-10-CM

## 2016-01-20 DIAGNOSIS — E119 Type 2 diabetes mellitus without complications: Secondary | ICD-10-CM | POA: Diagnosis not present

## 2016-01-20 DIAGNOSIS — M8949 Other hypertrophic osteoarthropathy, multiple sites: Secondary | ICD-10-CM

## 2016-01-20 MED ORDER — OXYCODONE HCL 10 MG PO TABS: 10.0000 mg | ORAL_TABLET | Freq: Three times a day (TID) | ORAL | 0 refills | Status: DC | PRN

## 2016-01-20 MED ORDER — MORPHINE SULFATE ER 30 MG PO TBCR: 30.0000 mg | EXTENDED_RELEASE_TABLET | Freq: Three times a day (TID) | ORAL | 0 refills | Status: DC

## 2016-01-20 NOTE — Progress Notes (Signed)
Pre visit review using our clinic review tool, if applicable. No additional management support is needed unless otherwise documented below in the visit note. 
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
## 2016-01-24 NOTE — Patient Instructions (Signed)
Richard Levine  01/24/2016     @PREFPERIOPPHARMACY @   Your procedure is scheduled on  01/28/2016  Report to [facility] at  925  A.M.  Call this number if you have problems the morning of surgery:  [phone]   Remember:  Do not eat food or drink liquids after midnight.  Take these medicines the morning of surgery with A SIP OF WATER  Klonopin, lisinopril, metoprolol, ms contin or oxycodone, protonix.   Do not wear jewelry, make-up or nail polish.  Do not wear lotions, powders, or perfumes, or deoderant.  Do not shave 48 hours prior to surgery.  Men may shave face and neck.  Do not bring valuables to the hospital.  [facility] is not responsible for any belongings or valuables.  Contacts, dentures or bridgework may not be worn into surgery.  Leave your suitcase in the car.  After surgery it may be brought to your room.  For patients admitted to the hospital, discharge time will be determined by your treatment team.  Patients discharged the day of surgery will not be allowed to drive home.   Name and phone number of your driver:   family Special instructions:  Follow the diet and prep instructions given to you by Dr Olevia Perches office.  Please read over the following fact sheets that you were given. Anesthesia Post-op Instructions and Care and Recovery After Surgery       Colonoscopy A colonoscopy is an exam to look at the entire large intestine (colon). This exam can help find problems such as tumors, polyps, inflammation, and areas of bleeding. The exam takes about 1 hour.  LET [facility] CARE PROVIDER KNOW ABOUT:   Any allergies you have.  All medicines you are taking, including vitamins, herbs, eye drops, creams, and over-the-counter medicines.  Previous problems you or members of your family have had with the use of anesthetics.  Any blood disorders you have.  Previous surgeries you have had.  Medical conditions you have. RISKS AND  COMPLICATIONS  Generally, this is a safe procedure. However, as with any procedure, complications can occur. Possible complications include:  Bleeding.  Tearing or rupture of the colon wall.  Reaction to medicines given during the exam.  Infection (rare). BEFORE THE PROCEDURE   Ask your health care provider about changing or stopping your regular medicines.  You may be prescribed an oral bowel prep. This involves drinking a large amount of medicated liquid, starting the day before your procedure. The liquid will cause you to have multiple loose stools until your stool is almost clear or light green. This cleans out your colon in preparation for the procedure.  Do not eat or drink anything else once you have started the bowel prep, unless your health care provider tells you it is safe to do so.  Arrange for someone to drive you home after the procedure. PROCEDURE   You will be given medicine to help you relax (sedative).  You will lie on your side with your knees bent.  A long, flexible tube with a light and camera on the end (colonoscope) will be inserted through the rectum and into the colon. The camera sends video back to a computer screen as it moves through the colon. The colonoscope also releases carbon dioxide gas to inflate the colon. This helps your health care provider see the area better.  During the exam, your health care provider may take  a small tissue sample (biopsy) to be examined under a microscope if any abnormalities are found.  The exam is finished when the entire colon has been viewed. AFTER THE PROCEDURE   Do not drive for 24 hours after the exam.  You may have a small amount of blood in your stool.  You may pass moderate amounts of gas and have mild abdominal cramping or bloating. This is caused by the gas used to inflate your colon during the exam.  Ask when your test results will be ready and how you will get your results. Make sure you get your test  results.   This information is not intended to replace advice given to you by your health care provider. Make sure you discuss any questions you have with your health care provider.   Document Released: 03/10/2000 Document Revised: 01/01/2013 Document Reviewed: 11/18/2012 Elsevier Interactive Patient Education 2016 [employer] Colonoscopy, Care After Refer to this sheet in the next few weeks. These instructions provide you with information on caring for yourself after your procedure. Your health care provider may also give you more specific instructions. Your treatment has been planned according to current medical practices, but problems sometimes occur. Call your health care provider if you have any problems or questions after your procedure. WHAT TO EXPECT AFTER THE PROCEDURE  After your procedure, it is typical to have the following:  A small amount of blood in your stool.  Moderate amounts of gas and mild abdominal cramping or bloating. HOME CARE INSTRUCTIONS  Do not drive, operate machinery, or sign important documents for 24 hours.  You may shower and resume your regular physical activities, but move at a slower pace for the first 24 hours.  Take frequent rest periods for the first 24 hours.  Walk around or put a warm pack on your abdomen to help reduce abdominal cramping and bloating.  Drink enough fluids to keep your urine clear or pale yellow.  You may resume your normal diet as instructed by your health care provider. Avoid heavy or fried foods that are hard to digest.  Avoid drinking alcohol for 24 hours or as instructed by your health care provider.  Only take over-the-counter or prescription medicines as directed by your health care provider.  If a tissue sample (biopsy) was taken during your procedure:  Do not take aspirin or blood thinners for 7 days, or as instructed by your health care provider.  Do not drink alcohol for 7 days, or as instructed by your health  care provider.  Eat soft foods for the first 24 hours. SEEK MEDICAL CARE IF: You have persistent spotting of blood in your stool 2-3 days after the procedure. SEEK IMMEDIATE MEDICAL CARE IF:  You have more than a small spotting of blood in your stool.  You pass large blood clots in your stool.  Your abdomen is swollen (distended).  You have nausea or vomiting.  You have a fever.  You have increasing abdominal pain that is not relieved with medicine.   This information is not intended to replace advice given to you by your health care provider. Make sure you discuss any questions you have with your health care provider.   Document Released: 10/26/2003 Document Revised: 01/01/2013 Document Reviewed: 11/18/2012 Elsevier Interactive Patient Education 2016 [place] Monitored anesthesia care is an anesthesia service for a medical procedure. Anesthesia is the loss of the ability to feel pain. It is produced by medicines called anesthetics. It may  affect a small area of your body (local anesthesia), a large area of your body (regional anesthesia), or your entire body (general anesthesia). The need for monitored anesthesia care depends your procedure, your condition, and the potential need for regional or general anesthesia. It is often provided during procedures where:   General anesthesia may be needed if there are complications. This is because you need special care when you are under general anesthesia.   You will be under local or regional anesthesia. This is so that you are able to have higher levels of anesthesia if needed.   You will receive calming medicines (sedatives). This is especially the case if sedatives are given to put you in a semi-conscious state of relaxation (deep sedation). This is because the amount of sedative needed to produce this state can be hard to predict. Too much of a sedative can produce general anesthesia. Monitored anesthesia  care is performed by one or more health care providers who have special training in all types of anesthesia. You will need to meet with these health care providers before your procedure. During this meeting, they will ask you about your medical history. They will also give you instructions to follow. (For example, you will need to stop eating and drinking before your procedure. You may also need to stop or change medicines you are taking.) During your procedure, your health care providers will stay with you. They will:   Watch your condition. This includes watching your blood pressure, breathing, and level of pain.   Diagnose and treat problems that occur.   Give medicines if they are needed. These may include calming medicines (sedatives) and anesthetics.   Make sure you are comfortable.  Having monitored anesthesia care does not necessarily mean that you will be under anesthesia. It does mean that your health care providers will be able to manage anesthesia if you need it or if it occurs. It also means that you will be able to have a different type of anesthesia than you are having if you need it. When your procedure is complete, your health care providers will continue to watch your condition. They will make sure any medicines wear off before you are allowed to go home.    This information is not intended to replace advice given to you by your health care provider. Make sure you discuss any questions you have with your health care provider.   Document Released: 12/07/2004 Document Revised: 04/03/2014 Document Reviewed: 04/24/2012 Elsevier Interactive Patient Education 2016 [employer] PATIENT INSTRUCTIONS POST-ANESTHESIA  IMMEDIATELY FOLLOWING SURGERY:  Do not drive or operate machinery for the first twenty four hours after surgery.  Do not make any important decisions for twenty four hours after surgery or while taking narcotic pain medications or sedatives.  If you develop intractable  nausea and vomiting or a severe headache please notify your doctor immediately.  FOLLOW-UP:  Please make an appointment with your surgeon as instructed. You do not need to follow up with anesthesia unless specifically instructed to do so.  WOUND CARE INSTRUCTIONS (if applicable):  Keep a dry clean dressing on the anesthesia/puncture wound site if there is drainage.  Once the wound has quit draining you may leave it open to air.  Generally you should leave the bandage intact for twenty four hours unless there is drainage.  If the epidural site drains for more than 36-48 hours please call the anesthesia department.  QUESTIONS?:  Please feel free to call your physician or the hospital  operator if you have any questions, and they will be happy to assist you.

## 2016-01-25 ENCOUNTER — Encounter (HOSPITAL_COMMUNITY): Payer: Self-pay

## 2016-01-25 ENCOUNTER — Encounter (HOSPITAL_COMMUNITY)
Admission: RE | Admit: 2016-01-25 | Discharge: 2016-01-25 | Disposition: A | Discharge status: Home / Self Care | Payer: Medicare Other | Source: Ambulatory Visit | Attending: Internal Medicine | Admitting: Internal Medicine

## 2016-01-28 ENCOUNTER — Encounter (HOSPITAL_COMMUNITY): Admission: RE | Payer: Self-pay | Source: Ambulatory Visit

## 2016-01-28 ENCOUNTER — Ambulatory Visit (HOSPITAL_COMMUNITY): Admission: RE | Admit: 2016-01-28 | Payer: Medicare Other | Source: Ambulatory Visit | Admitting: Internal Medicine

## 2016-01-28 SURGERY — COLONOSCOPY WITH PROPOFOL
Anesthesia: Monitor Anesthesia Care

## 2016-02-02 ENCOUNTER — Encounter (INDEPENDENT_AMBULATORY_CARE_PROVIDER_SITE_OTHER): Payer: Self-pay | Admitting: *Deleted

## 2016-02-02 ENCOUNTER — Other Ambulatory Visit (INDEPENDENT_AMBULATORY_CARE_PROVIDER_SITE_OTHER): Payer: Self-pay | Admitting: *Deleted

## 2016-02-02 DIAGNOSIS — Z1211 Encounter for screening for malignant neoplasm of colon: Secondary | ICD-10-CM

## 2016-02-12 ENCOUNTER — Other Ambulatory Visit: Payer: Self-pay | Admitting: Family Medicine

## 2016-02-14 NOTE — Telephone Encounter (Signed)
Clonazepam [employer]. Patient going out of town needs to pick up Rx.

## 2016-02-14 NOTE — Telephone Encounter (Signed)
Rx faxed. Pt advised by Diane.

## 2016-02-14 NOTE — Telephone Encounter (Signed)
Please advise. Thanks.  

## 2016-02-14 NOTE — Telephone Encounter (Signed)
[facility]   RF request for clonazepam LOV: 01/20/16 Next ov: 04/23/16 Last written: 08/26/15 #60 w/ 5RF  Please advise. Thanks.

## 2016-03-06 ENCOUNTER — Encounter (INDEPENDENT_AMBULATORY_CARE_PROVIDER_SITE_OTHER): Payer: Self-pay | Admitting: *Deleted

## 2016-03-07 ENCOUNTER — Other Ambulatory Visit (HOSPITAL_COMMUNITY): Payer: Medicare Other

## 2016-03-24 ENCOUNTER — Telehealth: Payer: Self-pay | Admitting: Family Medicine

## 2016-03-24 NOTE — Telephone Encounter (Signed)
His wife is on my schedule this afternoon so I'll discuss this with her at that time.

## 2016-03-24 NOTE — Telephone Encounter (Signed)
Patient's wife called very concerned, pt has an awful cough and she is afraid he is going to get pneumonia or maybe already has it.  There are no available appointments today and she is very concerned about his health since last time this happened he ended up in ICU.  Patient is refusing going to ED and pt's wife is wanting to see if there is anything you can do? Please advise.

## 2016-03-24 NOTE — Patient Instructions (Signed)
Richard Levine  03/24/2016     @PREFPERIOPPHARMACY @   Your procedure is scheduled on  03/31/2016  Report to [facility] at  900  A.M.  Call this number if you have problems the morning of surgery:  [phone]   Remember:  Do not eat food or drink liquids after midnight.  Take these medicines the morning of surgery with A SIP OF WATER  Klonopin, lisinopril, metoprolol, ms contin or oxycodone, protonix.   Do not wear jewelry, make-up or nail polish.  Do not wear lotions, powders, or perfumes, or deoderant.  Do not shave 48 hours prior to surgery.  Men may shave face and neck.  Do not bring valuables to the hospital.  [facility] is not responsible for any belongings or valuables.  Contacts, dentures or bridgework may not be worn into surgery.  Leave your suitcase in the car.  After surgery it may be brought to your room.  For patients admitted to the hospital, discharge time will be determined by your treatment team.  Patients discharged the day of surgery will not be allowed to drive home.   Name and phone number of your driver:   family Special instructions:  Follow the diet and prep instructions given to you by Dr Olevia Perches office.  Please read over the following fact sheets that you were given. Anesthesia Post-op Instructions and Care and Recovery After Surgery       Colonoscopy, Adult A colonoscopy is an exam to look at the entire large intestine. During the exam, a lubricated, bendable tube is inserted into the anus and then passed into the rectum, colon, and other parts of the large intestine. A colonoscopy is often done as a part of normal colorectal screening or in response to certain symptoms, such as anemia, persistent diarrhea, abdominal pain, and blood in the stool. The exam can help screen for and diagnose medical problems, including:  Tumors.  Polyps.  Inflammation.  Areas of bleeding. Tell a health care provider about:  Any allergies you  have.  All medicines you are taking, including vitamins, herbs, eye drops, creams, and over-the-counter medicines.  Any problems you or family members have had with anesthetic medicines.  Any blood disorders you have.  Any surgeries you have had.  Any medical conditions you have.  Any problems you have had passing stool. What are the risks? Generally, this is a safe procedure. However, problems may occur, including:  Bleeding.  A tear in the intestine.  A reaction to medicines given during the exam.  Infection (rare). What happens before the procedure? Eating and drinking restrictions  Follow instructions from your health care provider about eating and drinking, which may include:  A few days before the procedure - follow a low-fiber diet. Avoid nuts, seeds, dried fruit, raw fruits, and vegetables.  1-3 days before the procedure - follow a clear liquid diet. Drink only clear liquids, such as clear broth or bouillon, black coffee or tea, clear juice, clear soft drinks or sports drinks, gelatin desert, and popsicles. Avoid any liquids that contain red or purple dye.  On the day of the procedure - do not eat or drink anything during the 2 hours before the procedure, or within the time period that your health care provider recommends. Bowel prep  If you were prescribed an oral bowel prep to clean out your colon:  Take it as told by your health care provider. Starting the day  before your procedure, you will need to drink a large amount of medicated liquid. The liquid will cause you to have multiple loose stools until your stool is almost clear or light green.  If your skin or anus gets irritated from diarrhea, you may use these to relieve the irritation:  Medicated wipes, such as adult wet wipes with aloe and vitamin E.  A skin soothing-product like petroleum jelly.  If you vomit while drinking the bowel prep, take a break for up to 60 minutes and then begin the bowel prep  again. If vomiting continues and you cannot take the bowel prep without vomiting, call your health care provider. General instructions  Ask your health care provider about changing or stopping your regular medicines. This is especially important if you are taking diabetes medicines or blood thinners.  Plan to have someone take you home from the hospital or clinic. What happens during the procedure?  An IV tube may be inserted into one of your veins.  You will be given medicine to help you relax (sedative).  To reduce your risk of infection:  Your health care team will wash or sanitize their hands.  Your anal area will be washed with soap.  You will be asked to lie on your side with your knees bent.  Your health care provider will lubricate a long, thin, flexible tube. The tube will have a camera and a light on the end.  The tube will be inserted into your anus.  The tube will be gently eased through your rectum and colon.  Air will be delivered into your colon to keep it open. You may feel some pressure or cramping.  The camera will be used to take images during the procedure.  A small tissue sample may be removed from your body to be examined under a microscope (biopsy). If any potential problems are found, the tissue will be sent to a lab for testing.  If small polyps are found, your health care provider may remove them and have them checked for cancer cells.  The tube that was inserted into your anus will be slowly removed. The procedure may vary among health care providers and hospitals. What happens after the procedure?  Your blood pressure, heart rate, breathing rate, and blood oxygen level will be monitored until the medicines you were given have worn off.  Do not drive for 24 hours after the exam.  You may have a small amount of blood in your stool.  You may pass gas and have mild abdominal cramping or bloating due to the air that was used to inflate your colon  during the exam.  It is up to you to get the results of your procedure. Ask your health care provider, or the department performing the procedure, when your results will be ready. This information is not intended to replace advice given to you by your health care provider. Make sure you discuss any questions you have with your health care provider. Document Released: 03/10/2000 Document Revised: 10/01/2015 Document Reviewed: 05/25/2015 Elsevier Interactive Patient Education  2017 [employer]  Colonoscopy, Adult, Care After This sheet gives you information about how to care for yourself after your procedure. Your health care provider may also give you more specific instructions. If you have problems or questions, contact your health care provider. What can I expect after the procedure? After the procedure, it is common to have:  A small amount of blood in your stool for 24 hours after the  procedure.  Some gas.  Mild abdominal cramping or bloating. Follow these instructions at home: General instructions  For the first 24 hours after the procedure:  Do not drive or use machinery.  Do not sign important documents.  Do not drink alcohol.  Do your regular daily activities at a slower pace than normal.  Eat soft, easy-to-digest foods.  Rest often.  Take over-the-counter or prescription medicines only as told by your health care provider.  It is up to you to get the results of your procedure. Ask your health care provider, or the department performing the procedure, when your results will be ready. Relieving cramping and bloating  Try walking around when you have cramps or feel bloated.  Apply heat to your abdomen as told by your health care provider. Use a heat source that your health care provider recommends, such as a moist heat pack or a heating pad.  Place a towel between your skin and the heat source.  Leave the heat on for 20-30 minutes.  Remove the heat if your skin  turns bright red. This is especially important if you are unable to feel pain, heat, or cold. You may have a greater risk of getting burned. Eating and drinking  Drink enough fluid to keep your urine clear or pale yellow.  Resume your normal diet as instructed by your health care provider. Avoid heavy or fried foods that are hard to digest.  Avoid drinking alcohol for as long as instructed by your health care provider. Contact a health care provider if:  You have blood in your stool 2-3 days after the procedure. Get help right away if:  You have more than a small spotting of blood in your stool.  You pass large blood clots in your stool.  Your abdomen is swollen.  You have nausea or vomiting.  You have a fever.  You have increasing abdominal pain that is not relieved with medicine. This information is not intended to replace advice given to you by your health care provider. Make sure you discuss any questions you have with your health care provider. Document Released: 10/26/2003 Document Revised: 12/06/2015 Document Reviewed: 05/25/2015 Elsevier Interactive Patient Education  2017 [place] Anesthesia is a term that refers to techniques, procedures, and medicines that help a person stay safe and comfortable during a medical procedure. Monitored anesthesia care, or sedation, is one type of anesthesia. Your anesthesia specialist may recommend sedation if you will be having a procedure that does not require you to be unconscious, such as:  Cataract surgery.  A dental procedure.  A biopsy.  A colonoscopy. During the procedure, you may receive a medicine to help you relax (sedative). There are three levels of sedation:  Mild sedation. At this level, you may feel awake and relaxed. You will be able to follow directions.  Moderate sedation. At this level, you will be sleepy. You may not remember the procedure.  Deep sedation. At this level, you  will be asleep. You will not remember the procedure. The more medicine you are given, the deeper your level of sedation will be. Depending on how you respond to the procedure, the anesthesia specialist may change your level of sedation or the type of anesthesia to fit your needs. An anesthesia specialist will monitor you closely during the procedure. Let your health care provider know about:  Any allergies you have.  All medicines you are taking, including vitamins, herbs, eye drops, creams, and over-the-counter medicines.  Any use of steroids (by mouth or as a cream).  Any problems you or family members have had with sedatives and anesthetic medicines.  Any blood disorders you have.  Any surgeries you have had.  Any medical conditions you have, such as sleep apnea.  Whether you are pregnant or may be pregnant.  Any use of cigarettes, alcohol, or street drugs. What are the risks? Generally, this is a safe procedure. However, problems may occur, including:  Getting too much medicine (oversedation).  Nausea.  Allergic reaction to medicines.  Trouble breathing. If this happens, a breathing tube may be used to help with breathing. It will be removed when you are awake and breathing on your own.  Heart trouble.  Lung trouble. Before the procedure Staying hydrated  Follow instructions from your health care provider about hydration, which may include:  Up to 2 hours before the procedure - you may continue to drink clear liquids, such as water, clear fruit juice, black coffee, and plain tea. Eating and drinking restrictions  Follow instructions from your health care provider about eating and drinking, which may include:  8 hours before the procedure - stop eating heavy meals or foods such as meat, fried foods, or fatty foods.  6 hours before the procedure - stop eating light meals or foods, such as toast or cereal.  6 hours before the procedure - stop drinking milk or drinks  that contain milk.  2 hours before the procedure - stop drinking clear liquids. Medicines  Ask your health care provider about:  Changing or stopping your regular medicines. This is especially important if you are taking diabetes medicines or blood thinners.  Taking medicines such as aspirin and ibuprofen. These medicines can thin your blood. Do not take these medicines before your procedure if your health care provider instructs you not to. Tests and exams  You will have a physical exam.  You may have blood tests done to show:  How well your kidneys and liver are working.  How well your blood can clot.  General instructions  Plan to have someone take you home from the hospital or clinic.  If you will be going home right after the procedure, plan to have someone with you for 24 hours. What happens during the procedure?  Your blood pressure, heart rate, breathing, level of pain and overall condition will be monitored.  An IV tube will be inserted into one of your veins.  Your anesthesia specialist will give you medicines as needed to keep you comfortable during the procedure. This may mean changing the level of sedation.  The procedure will be performed. After the procedure  Your blood pressure, heart rate, breathing rate, and blood oxygen level will be monitored until the medicines you were given have worn off.  Do not drive for 24 hours if you received a sedative.  You may:  Feel sleepy, clumsy, or nauseous.  Feel forgetful about what happened after the procedure.  Have a sore throat if you had a breathing tube during the procedure.  Vomit. This information is not intended to replace advice given to you by your health care provider. Make sure you discuss any questions you have with your health care provider. Document Released: 12/07/2004 Document Revised: 08/20/2015 Document Reviewed: 07/04/2015 Elsevier Interactive Patient Education  2017 [place], Care After These instructions provide you with information about caring for yourself after your procedure. Your health care provider may also give you more specific instructions.  Your treatment has been planned according to current medical practices, but problems sometimes occur. Call your health care provider if you have any problems or questions after your procedure. What can I expect after the procedure? After your procedure, it is common to:  Feel sleepy for several hours.  Feel clumsy and have poor balance for several hours.  Feel forgetful about what happened after the procedure.  Have poor judgment for several hours.  Feel nauseous or vomit.  Have a sore throat if you had a breathing tube during the procedure. Follow these instructions at home: For at least 24 hours after the procedure:   Do not:  Participate in activities in which you could fall or become injured.  Drive.  Use heavy machinery.  Drink alcohol.  Take sleeping pills or medicines that cause drowsiness.  Make important decisions or sign legal documents.  Take care of children on your own.  Rest. Eating and drinking  Follow the diet that is recommended by your health care provider.  If you vomit, drink water, juice, or soup when you can drink without vomiting.  Make sure you have little or no nausea before eating solid foods. General instructions  Have a responsible adult stay with you until you are awake and alert.  Take over-the-counter and prescription medicines only as told by your health care provider.  If you smoke, do not smoke without supervision.  Keep all follow-up visits as told by your health care provider. This is important. Contact a health care provider if:  You keep feeling nauseous or you keep vomiting.  You feel light-headed.  You develop a rash.  You have a fever. Get help right away if:  You have trouble breathing. This information is not intended  to replace advice given to you by your health care provider. Make sure you discuss any questions you have with your health care provider. Document Released: 07/04/2015 Document Revised: 11/03/2015 Document Reviewed: 07/04/2015 Elsevier Interactive Patient Education  2017 [employer].

## 2016-03-29 ENCOUNTER — Encounter (HOSPITAL_COMMUNITY): Payer: Self-pay

## 2016-03-29 ENCOUNTER — Encounter (HOSPITAL_COMMUNITY)
Admission: RE | Admit: 2016-03-29 | Discharge: 2016-03-29 | Disposition: A | Discharge status: Home / Self Care | Payer: PPO | Source: Ambulatory Visit | Attending: Internal Medicine | Admitting: Internal Medicine

## 2016-03-29 DIAGNOSIS — G8929 Other chronic pain: Secondary | ICD-10-CM | POA: Diagnosis not present

## 2016-03-29 DIAGNOSIS — N182 Chronic kidney disease, stage 2 (mild): Secondary | ICD-10-CM | POA: Diagnosis not present

## 2016-03-29 DIAGNOSIS — E1122 Type 2 diabetes mellitus with diabetic chronic kidney disease: Secondary | ICD-10-CM | POA: Diagnosis not present

## 2016-03-29 DIAGNOSIS — M21371 Foot drop, right foot: Secondary | ICD-10-CM | POA: Diagnosis not present

## 2016-03-29 DIAGNOSIS — Z6841 Body Mass Index (BMI) 40.0 and over, adult: Secondary | ICD-10-CM | POA: Diagnosis not present

## 2016-03-29 DIAGNOSIS — M479 Spondylosis, unspecified: Secondary | ICD-10-CM | POA: Diagnosis not present

## 2016-03-29 DIAGNOSIS — M549 Dorsalgia, unspecified: Secondary | ICD-10-CM | POA: Diagnosis not present

## 2016-03-29 DIAGNOSIS — K219 Gastro-esophageal reflux disease without esophagitis: Secondary | ICD-10-CM | POA: Diagnosis not present

## 2016-03-29 DIAGNOSIS — Z1211 Encounter for screening for malignant neoplasm of colon: Secondary | ICD-10-CM

## 2016-03-29 DIAGNOSIS — G40909 Epilepsy, unspecified, not intractable, without status epilepticus: Secondary | ICD-10-CM | POA: Diagnosis not present

## 2016-03-29 DIAGNOSIS — E782 Mixed hyperlipidemia: Secondary | ICD-10-CM | POA: Diagnosis not present

## 2016-03-29 DIAGNOSIS — M170 Bilateral primary osteoarthritis of knee: Secondary | ICD-10-CM | POA: Diagnosis not present

## 2016-03-29 DIAGNOSIS — K5730 Diverticulosis of large intestine without perforation or abscess without bleeding: Secondary | ICD-10-CM | POA: Diagnosis not present

## 2016-03-29 DIAGNOSIS — Z7982 Long term (current) use of aspirin: Secondary | ICD-10-CM | POA: Diagnosis not present

## 2016-03-29 DIAGNOSIS — I129 Hypertensive chronic kidney disease with stage 1 through stage 4 chronic kidney disease, or unspecified chronic kidney disease: Secondary | ICD-10-CM | POA: Diagnosis not present

## 2016-03-29 DIAGNOSIS — K6389 Other specified diseases of intestine: Secondary | ICD-10-CM | POA: Diagnosis not present

## 2016-03-29 DIAGNOSIS — M5136 Other intervertebral disc degeneration, lumbar region: Secondary | ICD-10-CM | POA: Diagnosis not present

## 2016-03-29 DIAGNOSIS — F329 Major depressive disorder, single episode, unspecified: Secondary | ICD-10-CM | POA: Diagnosis not present

## 2016-03-29 DIAGNOSIS — E669 Obesity, unspecified: Secondary | ICD-10-CM | POA: Diagnosis not present

## 2016-03-29 DIAGNOSIS — F419 Anxiety disorder, unspecified: Secondary | ICD-10-CM | POA: Diagnosis not present

## 2016-03-29 LAB — CBC WITH DIFFERENTIAL/PLATELET
BASOS PCT: 0 %
Basophils Absolute: 0 10*3/uL (ref 0.0–0.1)
Eosinophils Absolute: 0.6 10*3/uL (ref 0.0–0.7)
Eosinophils Relative: 7 %
HEMATOCRIT: 43.7 % (ref 39.0–52.0)
HEMOGLOBIN: 14.4 g/dL (ref 13.0–17.0)
LYMPHS PCT: 26 %
Lymphs Abs: 2.2 10*3/uL (ref 0.7–4.0)
MCH: 32.8 pg (ref 26.0–34.0)
MCHC: 33 g/dL (ref 30.0–36.0)
MCV: 99.5 fL (ref 78.0–100.0)
Monocytes Absolute: 0.9 10*3/uL (ref 0.1–1.0)
Monocytes Relative: 11 %
NEUTROS ABS: 4.7 10*3/uL (ref 1.7–7.7)
NEUTROS PCT: 56 %
Platelets: 215 10*3/uL (ref 150–400)
RBC: 4.39 MIL/uL (ref 4.22–5.81)
RDW: 12.9 % (ref 11.5–15.5)
WBC: 8.5 10*3/uL (ref 4.0–10.5)

## 2016-03-29 LAB — BASIC METABOLIC PANEL
ANION GAP: 7 (ref 5–15)
BUN: 14 mg/dL (ref 6–20)
CALCIUM: 8.5 mg/dL — AB (ref 8.9–10.3)
CHLORIDE: 97 mmol/L — AB (ref 101–111)
CO2: 32 mmol/L (ref 22–32)
Creatinine, Ser: 1.07 mg/dL (ref 0.61–1.24)
GFR calc non Af Amer: >60 mL/min (ref >60)
Glucose, Bld: 159 mg/dL — ABNORMAL HIGH (ref 65–99)
POTASSIUM: 4.1 mmol/L (ref 3.5–5.1)
Sodium: 136 mmol/L (ref 135–145)

## 2016-03-29 NOTE — Progress Notes (Signed)
   03/29/16 0827  OBSTRUCTIVE SLEEP APNEA  Have you ever been diagnosed with sleep apnea through a sleep study? No  Do you snore loudly (loud enough to be heard through closed doors)?  1  Do you often feel tired, fatigued, or sleepy during the daytime (such as falling asleep during driving or talking to someone)? 0  Has anyone observed you stop breathing during your sleep? 0  Do you have, or are you being treated for high blood pressure? 1  BMI more than 35 kg/m2? 1  Age > 50 (1-yes) 1  Neck circumference greater than:Male 16 inches or larger, Male 17inches or larger? 1  Male Gender (Yes=1) 1  Obstructive Sleep Apnea Score 6

## 2016-03-31 ENCOUNTER — Encounter (HOSPITAL_COMMUNITY)
Admission: RE | Disposition: A | Discharge status: Home / Self Care | Payer: Self-pay | Source: Ambulatory Visit | Attending: Internal Medicine

## 2016-03-31 ENCOUNTER — Encounter (HOSPITAL_COMMUNITY): Payer: Self-pay | Admitting: *Deleted

## 2016-03-31 ENCOUNTER — Ambulatory Visit (HOSPITAL_COMMUNITY)
Admission: RE | Admit: 2016-03-31 | Discharge: 2016-03-31 | Disposition: A | Discharge status: Home / Self Care | Payer: PPO | Source: Ambulatory Visit | Attending: Internal Medicine | Admitting: Internal Medicine

## 2016-03-31 ENCOUNTER — Ambulatory Visit (HOSPITAL_COMMUNITY): Payer: PPO | Admitting: Anesthesiology

## 2016-03-31 DIAGNOSIS — F419 Anxiety disorder, unspecified: Secondary | ICD-10-CM | POA: Insufficient documentation

## 2016-03-31 DIAGNOSIS — M5136 Other intervertebral disc degeneration, lumbar region: Secondary | ICD-10-CM | POA: Diagnosis not present

## 2016-03-31 DIAGNOSIS — M170 Bilateral primary osteoarthritis of knee: Secondary | ICD-10-CM | POA: Insufficient documentation

## 2016-03-31 DIAGNOSIS — M21371 Foot drop, right foot: Secondary | ICD-10-CM | POA: Insufficient documentation

## 2016-03-31 DIAGNOSIS — E669 Obesity, unspecified: Secondary | ICD-10-CM | POA: Insufficient documentation

## 2016-03-31 DIAGNOSIS — G8929 Other chronic pain: Secondary | ICD-10-CM | POA: Diagnosis not present

## 2016-03-31 DIAGNOSIS — K6389 Other specified diseases of intestine: Secondary | ICD-10-CM | POA: Diagnosis not present

## 2016-03-31 DIAGNOSIS — G40909 Epilepsy, unspecified, not intractable, without status epilepticus: Secondary | ICD-10-CM | POA: Insufficient documentation

## 2016-03-31 DIAGNOSIS — K219 Gastro-esophageal reflux disease without esophagitis: Secondary | ICD-10-CM | POA: Insufficient documentation

## 2016-03-31 DIAGNOSIS — E1122 Type 2 diabetes mellitus with diabetic chronic kidney disease: Secondary | ICD-10-CM | POA: Diagnosis not present

## 2016-03-31 DIAGNOSIS — M549 Dorsalgia, unspecified: Secondary | ICD-10-CM | POA: Diagnosis not present

## 2016-03-31 DIAGNOSIS — M479 Spondylosis, unspecified: Secondary | ICD-10-CM | POA: Insufficient documentation

## 2016-03-31 DIAGNOSIS — Z1211 Encounter for screening for malignant neoplasm of colon: Secondary | ICD-10-CM

## 2016-03-31 DIAGNOSIS — Z6841 Body Mass Index (BMI) 40.0 and over, adult: Secondary | ICD-10-CM | POA: Insufficient documentation

## 2016-03-31 DIAGNOSIS — I129 Hypertensive chronic kidney disease with stage 1 through stage 4 chronic kidney disease, or unspecified chronic kidney disease: Secondary | ICD-10-CM | POA: Diagnosis not present

## 2016-03-31 DIAGNOSIS — N182 Chronic kidney disease, stage 2 (mild): Secondary | ICD-10-CM | POA: Insufficient documentation

## 2016-03-31 DIAGNOSIS — K5730 Diverticulosis of large intestine without perforation or abscess without bleeding: Secondary | ICD-10-CM | POA: Insufficient documentation

## 2016-03-31 DIAGNOSIS — F329 Major depressive disorder, single episode, unspecified: Secondary | ICD-10-CM | POA: Diagnosis not present

## 2016-03-31 DIAGNOSIS — E782 Mixed hyperlipidemia: Secondary | ICD-10-CM | POA: Insufficient documentation

## 2016-03-31 DIAGNOSIS — Z7982 Long term (current) use of aspirin: Secondary | ICD-10-CM | POA: Insufficient documentation

## 2016-03-31 HISTORY — PX: COLONOSCOPY WITH PROPOFOL: SHX5780

## 2016-03-31 LAB — GLUCOSE, CAPILLARY
Glucose-Capillary: 140 mg/dL — ABNORMAL HIGH (ref 65–99)
Glucose-Capillary: 94 mg/dL (ref 65–99)

## 2016-03-31 SURGERY — COLONOSCOPY WITH PROPOFOL
Anesthesia: Monitor Anesthesia Care

## 2016-03-31 MED ORDER — MIDAZOLAM HCL 2 MG/2ML IJ SOLN
INTRAMUSCULAR | Status: AC
Start: 2016-03-31 — End: 2016-03-31
  Filled 2016-03-31: qty 2

## 2016-03-31 MED ORDER — MIDAZOLAM HCL 2 MG/2ML IJ SOLN
INTRAMUSCULAR | Status: AC
  Filled 2016-03-31: qty 2

## 2016-03-31 MED ORDER — PROPOFOL 10 MG/ML IV BOLUS
INTRAVENOUS | Status: AC
  Filled 2016-03-31: qty 40

## 2016-03-31 MED ORDER — FENTANYL CITRATE (PF) 100 MCG/2ML IJ SOLN
INTRAMUSCULAR | Status: AC
  Filled 2016-03-31: qty 2

## 2016-03-31 MED ORDER — ONDANSETRON HCL 4 MG/2ML IJ SOLN
4.0000 mg | Freq: Once | INTRAMUSCULAR | Status: AC
  Administered 2016-03-31: 4 mg via INTRAVENOUS

## 2016-03-31 MED ORDER — CHLORHEXIDINE GLUCONATE CLOTH 2 % EX PADS: 6.0000 | MEDICATED_PAD | Freq: Once | CUTANEOUS | Status: DC

## 2016-03-31 MED ORDER — GLYCOPYRROLATE 0.2 MG/ML IJ SOLN
0.2000 mg | Freq: Once | INTRAMUSCULAR | Status: AC | PRN
  Administered 2016-03-31: 0.2 mg via INTRAVENOUS

## 2016-03-31 MED ORDER — PROPOFOL 500 MG/50ML IV EMUL
INTRAVENOUS | Status: DC | PRN
  Administered 2016-03-31: 10:00:00 via INTRAVENOUS
  Administered 2016-03-31: 150 ug/kg/min via INTRAVENOUS

## 2016-03-31 MED ORDER — FENTANYL CITRATE (PF) 100 MCG/2ML IJ SOLN
25.0000 ug | INTRAMUSCULAR | Status: AC | PRN
  Administered 2016-03-31 (×2): 25 ug via INTRAVENOUS

## 2016-03-31 MED ORDER — MIDAZOLAM HCL 5 MG/5ML IJ SOLN
INTRAMUSCULAR | Status: DC | PRN
Start: 2016-03-31 — End: 2016-03-31
  Administered 2016-03-31: 2 mg via INTRAVENOUS

## 2016-03-31 MED ORDER — MIDAZOLAM HCL 2 MG/2ML IJ SOLN
1.0000 mg | INTRAMUSCULAR | Status: DC | PRN
  Administered 2016-03-31 (×2): 2 mg via INTRAVENOUS

## 2016-03-31 MED ORDER — LACTATED RINGERS IV SOLN
INTRAVENOUS | Status: DC
  Administered 2016-03-31: 09:00:00 via INTRAVENOUS

## 2016-03-31 MED ORDER — GLYCOPYRROLATE 0.2 MG/ML IJ SOLN
INTRAMUSCULAR | Status: AC
  Filled 2016-03-31: qty 1

## 2016-03-31 MED ORDER — LIDOCAINE HCL (CARDIAC) 10 MG/ML IV SOLN
INTRAVENOUS | Status: DC | PRN
  Administered 2016-03-31: 30 mg via INTRAVENOUS

## 2016-03-31 MED ORDER — ONDANSETRON HCL 4 MG/2ML IJ SOLN
INTRAMUSCULAR | Status: AC
  Filled 2016-03-31: qty 2

## 2016-03-31 NOTE — Transfer of Care (Signed)
Immediate Anesthesia Transfer of Care Note  Patient: Richard Levine  Procedure(s) Performed: Procedure(s) with comments: COLONOSCOPY WITH PROPOFOL (N/A) - 9:20  Patient Location: PACU  Anesthesia Type:MAC  Level of Consciousness: awake, alert , oriented and patient cooperative  Airway & Oxygen Therapy: Patient Spontanous Breathing and Patient connected to nasal cannula oxygen  Post-op Assessment: Report given to RN and Post -op Vital signs reviewed and stable  Post vital signs: Reviewed and stable  Last Vitals:  Vitals:   03/31/16 0900 03/31/16 0905  BP: (!) 142/75 135/75  Pulse:    Resp: 16 (!) 23  Temp:      Last Pain:  Vitals:   03/31/16 0825  TempSrc: Oral         Complications: No apparent anesthesia complications
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
## 2016-03-31 NOTE — Discharge Instructions (Signed)
Resume usual medications and high fiber diet. No driving for 24 hours. Consider next screening exam in 5 years.  PATIENT INSTRUCTIONS POST-ANESTHESIA  IMMEDIATELY FOLLOWING SURGERY:  Do not drive or operate machinery for the first twenty four hours after surgery.  Do not make any important decisions for twenty four hours after surgery or while taking narcotic pain medications or sedatives.  If you develop intractable nausea and vomiting or a severe headache please notify your doctor immediately.  FOLLOW-UP:  Please make an appointment with your surgeon as instructed. You do not need to follow up with anesthesia unless specifically instructed to do so.  WOUND CARE INSTRUCTIONS (if applicable):  Keep a dry clean dressing on the anesthesia/puncture wound site if there is drainage.  Once the wound has quit draining you may leave it open to air.  Generally you should leave the bandage intact for twenty four hours unless there is drainage.  If the epidural site drains for more than 36-48 hours please call the anesthesia department.  QUESTIONS?:  Please feel free to call your physician or the hospital operator if you have any questions, and they will be happy to assist you.        High-Fiber Diet Fiber, also called dietary fiber, is a type of carbohydrate found in fruits, vegetables, whole grains, and beans. A high-fiber diet can have many health benefits. Your health care provider may recommend a high-fiber diet to help:  Prevent constipation. Fiber can make your bowel movements more regular.  Lower your cholesterol.  Relieve hemorrhoids, uncomplicated diverticulosis, or irritable bowel syndrome.  Prevent overeating as part of a weight-loss plan.  Prevent heart disease, type 2 diabetes, and certain cancers. What is my plan? The recommended daily intake of fiber includes:  38 grams for men under age 5.  70 grams for men over age 40.  54 grams for women under age 81.  58 grams for  women over age 61. You can get the recommended daily intake of dietary fiber by eating a variety of fruits, vegetables, grains, and beans. Your health care provider may also recommend a fiber supplement if it is not possible to get enough fiber through your diet. What do I need to know about a high-fiber diet?  Fiber supplements have not been widely studied for their effectiveness, so it is better to get fiber through food sources.  Always check the fiber content on thenutrition facts label of any prepackaged food. Look for foods that contain at least 5 grams of fiber per serving.  Ask your dietitian if you have questions about specific foods that are related to your condition, especially if those foods are not listed in the following section.  Increase your daily fiber consumption gradually. Increasing your intake of dietary fiber too quickly may cause bloating, cramping, or gas.  Drink plenty of water. Water helps you to digest fiber. What foods can I eat? Grains  Whole-grain breads. Multigrain cereal. Oats and oatmeal. Brown rice. Barley. Bulgur wheat. [place]. Bran muffins. Popcorn. Rye wafer crackers. Vegetables  Sweet potatoes. Spinach. Kale. Artichokes. Cabbage. Broccoli. Green peas. Carrots. Squash. Fruits  Berries. Pears. Apples. Oranges. Avocados. Prunes and raisins. Dried figs. Meats and Other Protein Sources  Navy, kidney, pinto, and soy beans. Split peas. Lentils. Nuts and seeds. Dairy  Fiber-fortified yogurt. Beverages  Fiber-fortified soy milk. Fiber-fortified orange juice. Other  Fiber bars. The items listed above may not be a complete list of recommended foods or beverages. Contact your dietitian for more options.  What foods are not recommended? Grains  White bread. Pasta made with refined flour. White rice. Vegetables  Fried potatoes. Canned vegetables. Well-cooked vegetables. Fruits  Fruit juice. Cooked, strained fruit. Meats and Other Protein Sources  Fatty  cuts of meat. Fried Sales executive or fried fish. Dairy  Milk. Yogurt. Cream cheese. Sour cream. Beverages  Soft drinks. Other  Cakes and pastries. Butter and oils. The items listed above may not be a complete list of foods and beverages to avoid. Contact your dietitian for more information.  What are some tips for including high-fiber foods in my diet?  Eat a wide variety of high-fiber foods.  Make sure that half of all grains consumed each day are whole grains.  Replace breads and cereals made from refined flour or white flour with whole-grain breads and cereals.  Replace white rice with brown rice, bulgur wheat, or millet.  Start the day with a breakfast that is high in fiber, such as a cereal that contains at least 5 grams of fiber per serving.  Use beans in place of meat in soups, salads, or pasta.  Eat high-fiber snacks, such as berries, raw vegetables, nuts, or popcorn. This information is not intended to replace advice given to you by your health care provider. Make sure you discuss any questions you have with your health care provider. Document Released: 03/13/2005 Document Revised: 08/19/2015 Document Reviewed: 08/26/2013 Elsevier Interactive Patient Education  2017 [employer].  Colonoscopy, Adult, Care After This sheet gives you information about how to care for yourself after your procedure. Your health care provider may also give you more specific instructions. If you have problems or questions, contact your health care provider. What can I expect after the procedure? After the procedure, it is common to have:  A small amount of blood in your stool for 24 hours after the procedure.  Some gas.  Mild abdominal cramping or bloating. Follow these instructions at home: General instructions  For the first 24 hours after the procedure:  Do not drive or use machinery.  Do not sign important documents.  Do not drink alcohol.  Do your regular daily activities at a  slower pace than normal.  Eat soft, easy-to-digest foods.  Rest often.  Take over-the-counter or prescription medicines only as told by your health care provider.  It is up to you to get the results of your procedure. Ask your health care provider, or the department performing the procedure, when your results will be ready. Relieving cramping and bloating  Try walking around when you have cramps or feel bloated.  Apply heat to your abdomen as told by your health care provider. Use a heat source that your health care provider recommends, such as a moist heat pack or a heating pad.  Place a towel between your skin and the heat source.  Leave the heat on for 20-30 minutes.  Remove the heat if your skin turns bright red. This is especially important if you are unable to feel pain, heat, or cold. You may have a greater risk of getting burned. Eating and drinking  Drink enough fluid to keep your urine clear or pale yellow.  Resume your normal diet as instructed by your health care provider. Avoid heavy or fried foods that are hard to digest.  Avoid drinking alcohol for as long as instructed by your health care provider. Contact a health care provider if:  You have blood in your stool 2-3 days after the procedure. Get help right away if:  You have more than a small spotting of blood in your stool.  You pass large blood clots in your stool.  Your abdomen is swollen.  You have nausea or vomiting.  You have a fever.  You have increasing abdominal pain that is not relieved with medicine. This information is not intended to replace advice given to you by your health care provider. Make sure you discuss any questions you have with your health care provider. Document Released: 10/26/2003 Document Revised: 12/06/2015 Document Reviewed: 05/25/2015 Elsevier Interactive Patient Education  2017 [employer].

## 2016-03-31 NOTE — Anesthesia Procedure Notes (Signed)
Procedure Name: MAC Date/Time: 03/31/2016 9:33 AM Performed by: Andree Elk, AMY A Pre-anesthesia Checklist: Patient identified, Emergency Drugs available, Suction available and Patient being monitored Oxygen Delivery Method: Simple face mask

## 2016-03-31 NOTE — Anesthesia Postprocedure Evaluation (Signed)
Anesthesia Post Note  Patient: DEMITRIS POKORNY  Procedure(s) Performed: Procedure(s) (LRB): COLONOSCOPY WITH PROPOFOL (N/A)  Patient location during evaluation: PACU Anesthesia Type: MAC Level of consciousness: awake and alert and oriented Pain management: pain level controlled Vital Signs Assessment: post-procedure vital signs reviewed and stable Respiratory status: spontaneous breathing and patient connected to nasal cannula oxygen Cardiovascular status: stable Postop Assessment: no signs of nausea or vomiting Anesthetic complications: no     Last Vitals:  Vitals:   03/31/16 0900 03/31/16 0905  BP: (!) 142/75 135/75  Pulse:    Resp: 16 (!) 23  Temp:      Last Pain:  Vitals:   03/31/16 0825  TempSrc: Oral                 Pedrohenrique Mccon[place] A

## 2016-03-31 NOTE — Anesthesia Preprocedure Evaluation (Addendum)
Anesthesia Evaluation  Patient identified by MRN, date of birth, ID band Patient awake    Reviewed: Allergy & Precautions, H&P , NPO status , Patient's Chart, lab work & pertinent test results, reviewed documented beta blocker date and time   Airway Mallampati: II  TM Distance: >3 FB Neck ROM: full    Dental  (+) Teeth Intact, Caps, Dental Advisory Given   Pulmonary neg pulmonary ROS, Pneumonia: resolving URI, mild cough.,    Pulmonary exam normal breath sounds clear to auscultation       Cardiovascular hypertension, Pt. on home beta blockers and Pt. on medications Normal cardiovascular exam Rhythm:Regular Rate:Normal     Neuro/Psych Seizures -, Well Controlled,  PSYCHIATRIC DISORDERS Anxiety Depression    GI/Hepatic Neg liver ROS, GERD  Controlled and Medicated,  Endo/Other  diabetes, Type 2obese  Renal/GU Renal InsufficiencyRenal disease     Musculoskeletal  (+) Arthritis , Osteoarthritis,    Abdominal   Peds  Hematology negative hematology ROS (+)   Anesthesia Other Findings Caps front top X 2;  Goatee, mustache  Reproductive/Obstetrics                            Anesthesia Physical Anesthesia Plan  ASA: III  Anesthesia Plan: MAC   Post-op Pain Management:    Induction: Intravenous  Airway Management Planned: Simple Face Mask  Additional Equipment:   Intra-op Plan:   Post-operative Plan:   Informed Consent: I have reviewed the patients History and Physical, chart, labs and discussed the procedure including the risks, benefits and alternatives for the proposed anesthesia with the patient or authorized representative who has indicated his/her understanding and acceptance.     Plan Discussed with:   Anesthesia Plan Comments:         Anesthesia Quick Evaluation

## 2016-03-31 NOTE — Op Note (Signed)
[facility] Patient Name: Richard Levine Procedure Date: 03/31/2016 9:37 AM MRN: [id number] Date of Birth: 09-05-1950 Attending MD: Hildred Laser , MD CSN: [id number] Age: 66 Admit Type: Outpatient Procedure:                Colonoscopy Indications:              Screening for colorectal malignant neoplasm Providers:                Hildred Laser, MD, Charlyne Petrin RN, RN, Isabella Stalling, Technician Referring MD:             Tammi Sou, MD Medicines:                Propofol per Anesthesia Complications:            No immediate complications. Estimated Blood Loss:     Estimated blood loss: none. Procedure:                Pre-Anesthesia Assessment:                           - Prior to the procedure, a History and Physical                            was performed, and patient medications and                            allergies were reviewed. The patient's tolerance of                            previous anesthesia was also reviewed. The risks                            and benefits of the procedure and the sedation                            options and risks were discussed with the patient.                            All questions were answered, and informed consent                            was obtained. Prior Anticoagulants: The patient                            last took aspirin 7 days prior to the procedure.                            ASA Grade Assessment: III - A patient with severe                            systemic disease. After reviewing the risks and  benefits, the patient was deemed in satisfactory                            condition to undergo the procedure.                           After obtaining informed consent, the colonoscope                            was passed under direct vision. Throughout the                            procedure, the patient's blood pressure, pulse, and   oxygen saturations were monitored continuously. The                            [id number]) scope was introduced through                            the anus and advanced to the the cecum, identified                            by appendiceal orifice and ileocecal valve. The                            colonoscopy was technically difficult and complex                            due to inadequate bowel prep, poor endoscopic                            visualization and significant looping. Successful                            completion of the procedure was aided by changing                            the patient to a supine position. The quality of                            the bowel preparation was inadequate. The ileocecal                            valve, appendiceal orifice, and rectum were                            photographed. Scope In: 9:43:12 AM Scope Out: 10:09:17 AM Scope Withdrawal Time: 0 hours 6 minutes 22 seconds  Total Procedure Duration: 0 hours 26 minutes 5 seconds  Findings:      The perianal and digital rectal examinations were normal.      A diffuse area of moderate melanosis was found in the entire colon.      Scattered medium-mouthed diverticula were found in the sigmoid colon.      The retroflexed view of the distal rectum  and anal verge was normal and       showed no anal or rectal abnormalities. Impression:               - Preparation of the colon was inadequate.                           - Melanosis in the colon.                           - Diverticulosis in the sigmoid colon.                           - The distal rectum and anal verge are normal on                            retroflexion view.                           - No specimens collected. Moderate Sedation:      Per Anesthesia Care Recommendation:           - Patient has a contact number available for                            emergencies. The signs and symptoms of potential                             delayed complications were discussed with the                            patient. Return to normal activities tomorrow.                            Written discharge instructions were provided to the                            patient.                           - High fiber diet today.                           - Continue present medications.                           - Repeat colonoscopy in 5 years for screening                            purposes.                           - Patient will need two day prep. Procedure Code(s):        --- Professional ---                           [phone], Colonoscopy, flexible; diagnostic, including  collection of specimen(s) by brushing or washing,                            when performed (separate procedure) Diagnosis Code(s):        --- Professional ---                           Z12.11, Encounter for screening for malignant                            neoplasm of colon                           K63.89, Other specified diseases of intestine                           K57.30, Diverticulosis of large intestine without                            perforation or abscess without bleeding CPT copyright 2016 American Medical Association. All rights reserved. The codes documented in this report are preliminary and upon coder review may  be revised to meet current compliance requirements. Hildred Laser, MD Hildred Laser, MD 03/31/2016 10:26:41 AM This report has been signed electronically. Number of Addenda: 0

## 2016-04-03 ENCOUNTER — Encounter (HOSPITAL_COMMUNITY): Payer: Self-pay | Admitting: Internal Medicine

## 2016-04-10 ENCOUNTER — Encounter: Payer: Self-pay | Admitting: Family Medicine

## 2016-04-10 ENCOUNTER — Ambulatory Visit (INDEPENDENT_AMBULATORY_CARE_PROVIDER_SITE_OTHER): Payer: PPO | Admitting: Family Medicine

## 2016-04-10 VITALS — BP 132/85 | HR 72 | Temp 98.3°F | Resp 16 | Ht 70.0 in | Wt 266.2 lb

## 2016-04-10 DIAGNOSIS — F4322 Adjustment disorder with anxiety: Secondary | ICD-10-CM | POA: Diagnosis not present

## 2016-04-10 DIAGNOSIS — G44219 Episodic tension-type headache, not intractable: Secondary | ICD-10-CM
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
## 2016-04-10 NOTE — Progress Notes (Signed)
Pre visit review using our clinic review tool, if applicable. No additional management support is needed unless otherwise documented below in the visit note. 

## 2016-04-20 ENCOUNTER — Other Ambulatory Visit: Payer: Self-pay | Admitting: Family Medicine

## 2016-04-21 ENCOUNTER — Ambulatory Visit (INDEPENDENT_AMBULATORY_CARE_PROVIDER_SITE_OTHER): Payer: PPO | Admitting: Family Medicine

## 2016-04-21 ENCOUNTER — Encounter: Payer: Self-pay | Admitting: Family Medicine

## 2016-04-21 VITALS — BP 117/79 | HR 78 | Temp 98.1°F | Resp 16 | Ht 70.0 in | Wt 270.0 lb

## 2016-04-21 DIAGNOSIS — G894 Chronic pain syndrome: Secondary | ICD-10-CM | POA: Diagnosis not present

## 2016-04-21 DIAGNOSIS — N182 Chronic kidney disease, stage 2 (mild): Secondary | ICD-10-CM

## 2016-04-21 DIAGNOSIS — E7800 Pure hypercholesterolemia, unspecified: Secondary | ICD-10-CM

## 2016-04-21 DIAGNOSIS — E118 Type 2 diabetes mellitus with unspecified complications: Secondary | ICD-10-CM | POA: Diagnosis not present

## 2016-04-21 DIAGNOSIS — I10 Essential (primary) hypertension: Secondary | ICD-10-CM | POA: Diagnosis not present

## 2016-04-21 LAB — POCT GLYCOSYLATED HEMOGLOBIN (HGB A1C): Hemoglobin A1C: 6

## 2016-04-21 MED ORDER — OXYCODONE HCL 10 MG PO TABS: 10.0000 mg | ORAL_TABLET | Freq: Three times a day (TID) | ORAL | 0 refills | Status: DC | PRN

## 2016-04-21 MED ORDER — MORPHINE SULFATE ER 30 MG PO TBCR: 30.0000 mg | EXTENDED_RELEASE_TABLET | Freq: Three times a day (TID) | ORAL | 0 refills | Status: DC

## 2016-04-21 NOTE — Progress Notes (Signed)
Pre visit review using our clinic review tool, if applicable. No additional management support is needed unless otherwise documented below in the visit note. 
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
## 2016-07-17 ENCOUNTER — Encounter: Payer: Self-pay | Admitting: Family Medicine

## 2016-07-17 ENCOUNTER — Other Ambulatory Visit: Payer: Self-pay | Admitting: Family Medicine

## 2016-07-17 ENCOUNTER — Ambulatory Visit (INDEPENDENT_AMBULATORY_CARE_PROVIDER_SITE_OTHER): Payer: PPO | Admitting: Family Medicine

## 2016-07-17 VITALS — BP 97/63 | HR 84 | Temp 98.2°F | Resp 16 | Ht 70.0 in | Wt 266.8 lb

## 2016-07-17 DIAGNOSIS — E7800 Pure hypercholesterolemia, unspecified: Secondary | ICD-10-CM | POA: Diagnosis not present

## 2016-07-17 DIAGNOSIS — I10 Essential (primary) hypertension: Secondary | ICD-10-CM | POA: Diagnosis not present

## 2016-07-17 DIAGNOSIS — N182 Chronic kidney disease, stage 2 (mild): Secondary | ICD-10-CM | POA: Diagnosis not present

## 2016-07-17 DIAGNOSIS — Z0000 Encounter for general adult medical examination without abnormal findings: Secondary | ICD-10-CM

## 2016-07-17 DIAGNOSIS — G894 Chronic pain syndrome: Secondary | ICD-10-CM

## 2016-07-17 DIAGNOSIS — E119 Type 2 diabetes mellitus without complications: Secondary | ICD-10-CM | POA: Diagnosis not present

## 2016-07-17 LAB — BASIC METABOLIC PANEL
BUN: 19 mg/dL (ref 6–23)
CALCIUM: 9.7 mg/dL (ref 8.4–10.5)
CO2: 35 meq/L — AB (ref 19–32)
Chloride: 96 mEq/L (ref 96–112)
Creatinine, Ser: 1.23 mg/dL (ref 0.40–1.50)
GFR: 63.02 mL/min (ref >60.00)
Glucose, Bld: 121 mg/dL — ABNORMAL HIGH (ref 70–99)
POTASSIUM: 4.6 meq/L (ref 3.5–5.1)
Sodium: 137 mEq/L (ref 135–145)

## 2016-07-17 LAB — LIPID PANEL
Cholesterol: 218 mg/dL — ABNORMAL HIGH (ref 0–200)
HDL: 40.4 mg/dL (ref >39.00)
NONHDL: 177.5
Total CHOL/HDL Ratio: 5
Triglycerides: 306 mg/dL — ABNORMAL HIGH (ref 0.0–149.0)
VLDL: 61.2 mg/dL — ABNORMAL HIGH (ref 0.0–40.0)

## 2016-07-17 LAB — LDL CHOLESTEROL, DIRECT: Direct LDL: 130 mg/dL

## 2016-07-17 LAB — HEMOGLOBIN A1C: Hgb A1c MFr Bld: 6.5 % (ref 4.6–6.5)

## 2016-07-17 MED ORDER — MORPHINE SULFATE ER 30 MG PO TBCR: 30.0000 mg | EXTENDED_RELEASE_TABLET | Freq: Three times a day (TID) | ORAL | 0 refills | Status: DC

## 2016-07-17 MED ORDER — OXYCODONE HCL 10 MG PO TABS: 10.0000 mg | ORAL_TABLET | Freq: Three times a day (TID) | ORAL | 0 refills | Status: DC | PRN

## 2016-07-17 MED ORDER — ATORVASTATIN CALCIUM 80 MG PO TABS: 80.0000 mg | ORAL_TABLET | Freq: Every day | ORAL | 3 refills | Status: DC

## 2016-07-17 NOTE — Progress Notes (Signed)
Pre visit review using our clinic review tool, if applicable. No additional management support is needed unless otherwise documented below in the visit note. 
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
## 2016-07-17 NOTE — Patient Instructions (Addendum)
Continue doing brain stimulating activities (puzzles, reading, adult coloring books, staying active) to keep memory sharp.   Continue to eat heart healthy diet (full of fruits, vegetables, whole grains, lean protein, water--limit salt, fat, and sugar intake) and increase physical activity as tolerated.  Bring a copy of your advance directives to your next office visit.   Health Maintenance, Male A healthy lifestyle and preventive care is important for your health and wellness. Ask your health care provider about what schedule of regular examinations is right for you. What should I know about weight and diet?  Eat a Healthy Diet  Eat plenty of vegetables, fruits, whole grains, low-fat dairy products, and lean protein.  Do not eat a lot of foods high in solid fats, added sugars, or salt. Maintain a Healthy Weight  Regular exercise can help you achieve or maintain a healthy weight. You should:  Do at least 150 minutes of exercise each week. The exercise should increase your heart rate and make you sweat (moderate-intensity exercise).  Do strength-training exercises at least twice a week. Watch Your Levels of Cholesterol and Blood Lipids  Have your blood tested for lipids and cholesterol every 5 years starting at 64 years of age. If you are at high risk for heart disease, you should start having your blood tested when you are 64 years old. You may need to have your cholesterol levels checked more often if:  Your lipid or cholesterol levels are high.  You are older than 64 years of age.  You are at high risk for heart disease. What should I know about cancer screening? Many types of cancers can be detected early and may often be prevented. Lung Cancer  You should be screened every year for lung cancer if:  You are a current smoker who has smoked for at least 30 years.  You are a former smoker who has quit within the past 15 years.  Talk to your health care provider about your  screening options, when you should start screening, and how often you should be screened. Colorectal Cancer  Routine colorectal cancer screening usually begins at 64 years of age and should be repeated every 5-10 years until you are 64 years old. You may need to be screened more often if early forms of precancerous polyps or small growths are found. Your health care provider may recommend screening at an earlier age if you have risk factors for colon cancer.  Your health care provider may recommend using home test kits to check for hidden blood in the stool.  A small camera at the end of a tube can be used to examine your colon (sigmoidoscopy or colonoscopy). This checks for the earliest forms of colorectal cancer. Prostate and Testicular Cancer  Depending on your age and overall health, your health care provider may do certain tests to screen for prostate and testicular cancer.  Talk to your health care provider about any symptoms or concerns you have about testicular or prostate cancer. Skin Cancer  Check your skin from head to toe regularly.  Tell your health care provider about any new moles or changes in moles, especially if:  There is a change in a mole's size, shape, or color.  You have a mole that is larger than a pencil eraser.  Always use sunscreen. Apply sunscreen liberally and repeat throughout the day.  Protect yourself by wearing long sleeves, pants, a wide-brimmed hat, and sunglasses when outside. What should I know about heart disease, diabetes, and  high blood pressure?  If you are 95-77 years of age, have your blood pressure checked every 3-5 years. If you are 57 years of age or older, have your blood pressure checked every year. You should have your blood pressure measured twice-once when you are at a hospital or clinic, and once when you are not at a hospital or clinic. Record the average of the two measurements. To check your blood pressure when you are not at a  hospital or clinic, you can use:  An automated blood pressure machine at a pharmacy.  A home blood pressure monitor.  Talk to your health care provider about your target blood pressure.  If you are between 52-68 years old, ask your health care provider if you should take aspirin to prevent heart disease.  Have regular diabetes screenings by checking your fasting blood sugar level.  If you are at a normal weight and have a low risk for diabetes, have this test once every three years after the age of 77.  If you are overweight and have a high risk for diabetes, consider being tested at a younger age or more often.  A one-time screening for abdominal aortic aneurysm (AAA) by ultrasound is recommended for men aged 32-75 years who are current or former smokers. What should I know about preventing infection? Hepatitis B  If you have a higher risk for hepatitis B, you should be screened for this virus. Talk with your health care provider to find out if you are at risk for hepatitis B infection. Hepatitis C  Blood testing is recommended for:  Everyone born from [birth year] through 1963.  Anyone with known risk factors for hepatitis C. Sexually Transmitted Diseases (STDs)  You should be screened each year for STDs including gonorrhea and chlamydia if:  You are sexually active and are younger than 64 years of age.  You are older than 64 years of age and your health care provider tells you that you are at risk for this type of infection.  Your sexual activity has changed since you were last screened and you are at an increased risk for chlamydia or gonorrhea. Ask your health care provider if you are at risk.  Talk with your health care provider about whether you are at high risk of being infected with HIV. Your health care provider may recommend a prescription medicine to help prevent HIV infection. What else can I do?  Schedule regular health, dental, and eye exams.  Stay current with your  vaccines (immunizations).  Do not use any tobacco products, such as cigarettes, chewing tobacco, and e-cigarettes. If you need help quitting, ask your health care provider.  Limit alcohol intake to no more than 2 drinks per day. One drink equals 12 ounces of beer, 5 ounces of wine, or 1 ounces of hard liquor.  Do not use street drugs.  Do not share needles.  Ask your health care provider for help if you need support or information about quitting drugs.  Tell your health care provider if you often feel depressed.  Tell your health care provider if you have ever been abused or do not feel safe at home. This information is not intended to replace advice given to you by your health care provider. Make sure you discuss any questions you have with your health care provider. Document Released: 09/09/2007 Document Revised: 11/10/2015 Document Reviewed: 12/15/2014 Elsevier Interactive Patient Education  2017 [employer].

## 2016-07-17 NOTE — Progress Notes (Signed)
AWV reviewed and agree.  Signed:  Crissie Sickles, MD           07/17/2016
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
## 2016-07-20 ENCOUNTER — Ambulatory Visit: Payer: PPO | Admitting: Family Medicine

## 2016-07-24 ENCOUNTER — Other Ambulatory Visit: Payer: Self-pay | Admitting: Family Medicine

## 2016-08-04 ENCOUNTER — Other Ambulatory Visit: Payer: Self-pay | Admitting: Family Medicine

## 2016-08-04 NOTE — Telephone Encounter (Signed)
Rx faxed, patient aware.

## 2016-08-04 NOTE — Telephone Encounter (Signed)
Patient requesting RF of clonazepam last RF 01/2016 X 5 RFS.  Patient states it is a bit early because he can't get to the pharmacy on Sunday due to the short hours.    Please advise.

## 2016-09-06 ENCOUNTER — Ambulatory Visit (INDEPENDENT_AMBULATORY_CARE_PROVIDER_SITE_OTHER): Payer: PPO | Admitting: Family Medicine

## 2016-09-06 ENCOUNTER — Telehealth: Payer: Self-pay | Admitting: Family Medicine

## 2016-09-06 ENCOUNTER — Encounter: Payer: Self-pay | Admitting: Family Medicine

## 2016-09-06 VITALS — BP 123/79 | HR 79 | Temp 98.0°F | Resp 16 | Ht 70.0 in | Wt 266.8 lb

## 2016-09-06 DIAGNOSIS — R079 Chest pain, unspecified: Secondary | ICD-10-CM | POA: Diagnosis not present

## 2016-09-06 DIAGNOSIS — R0789 Other chest pain: Secondary | ICD-10-CM | POA: Diagnosis not present

## 2016-09-06 DIAGNOSIS — M25512 Pain in left shoulder: Secondary | ICD-10-CM

## 2016-09-06 DIAGNOSIS — F4329 Adjustment disorder with other symptoms: Secondary | ICD-10-CM | POA: Diagnosis not present

## 2016-09-06 DIAGNOSIS — M791 Myalgia, unspecified site: Secondary | ICD-10-CM

## 2016-09-06 NOTE — Telephone Encounter (Signed)
LM for patient to CB about echo. This cannot be scheduled at [facility] without patient seeing one of the providers there. The scheduler mentioned that the patient can go to the [facility] office to have just have the test done.
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
## 2016-09-19 ENCOUNTER — Telehealth (HOSPITAL_COMMUNITY): Payer: Self-pay | Admitting: *Deleted

## 2016-09-19 NOTE — Telephone Encounter (Signed)
Left message on voicemail in reference to upcoming appointment scheduled for 09/20/16 Phone number given for a call back so details instructions can be given.  Richard Levine

## 2016-09-20 ENCOUNTER — Ambulatory Visit (HOSPITAL_COMMUNITY): Payer: PPO

## 2016-09-20 ENCOUNTER — Encounter (HOSPITAL_COMMUNITY): Payer: PPO

## 2016-09-20 ENCOUNTER — Telehealth (HOSPITAL_COMMUNITY): Payer: Self-pay | Admitting: *Deleted

## 2016-09-20 NOTE — Telephone Encounter (Signed)
Patient was a no show for his nuclear appointment. Attempted to call to R/S, No answer.  Kirstie Peri

## 2016-09-21 ENCOUNTER — Ambulatory Visit (HOSPITAL_COMMUNITY): Payer: PPO

## 2016-09-24 HISTORY — PX: CARDIOVASCULAR STRESS TEST: SHX262

## 2016-09-29 LAB — HM DIABETES EYE EXAM

## 2016-10-03 ENCOUNTER — Ambulatory Visit (HOSPITAL_COMMUNITY): Payer: PPO | Attending: Internal Medicine

## 2016-10-03 DIAGNOSIS — R0789 Other chest pain: Secondary | ICD-10-CM | POA: Insufficient documentation

## 2016-10-03 LAB — MYOCARDIAL PERFUSION IMAGING
CHL CUP NUCLEAR SDS: 3
CHL CUP NUCLEAR SRS: 2
CHL CUP RESTING HR STRESS: 74 {beats}/min
CSEPPHR: 88 {beats}/min
LV dias vol: 103 mL (ref 62–150)
LV sys vol: 37 mL
RATE: 0.22
SSS: 5
TID: 0.97

## 2016-10-03 MED ORDER — REGADENOSON 0.4 MG/5ML IV SOLN
0.4000 mg | Freq: Once | INTRAVENOUS | Status: AC
  Administered 2016-10-03: 0.4 mg via INTRAVENOUS

## 2016-10-03 MED ORDER — TECHNETIUM TC 99M TETROFOSMIN IV KIT
32.5000 | PACK | Freq: Once | INTRAVENOUS | Status: AC | PRN
  Administered 2016-10-03: 32.5 via INTRAVENOUS
  Filled 2016-10-03: qty 33

## 2016-10-03 MED ORDER — TECHNETIUM TC 99M TETROFOSMIN IV KIT
10.6000 | PACK | Freq: Once | INTRAVENOUS | Status: AC | PRN
  Administered 2016-10-03: 10.6 via INTRAVENOUS
  Filled 2016-10-03: qty 11

## 2016-10-04 ENCOUNTER — Encounter: Payer: Self-pay | Admitting: Family Medicine

## 2016-10-10 ENCOUNTER — Ambulatory Visit (INDEPENDENT_AMBULATORY_CARE_PROVIDER_SITE_OTHER): Payer: PPO | Admitting: Family Medicine

## 2016-10-10 ENCOUNTER — Encounter: Payer: Self-pay | Admitting: Family Medicine

## 2016-10-10 VITALS — BP 106/74 | HR 82 | Temp 98.1°F | Resp 16 | Ht 70.0 in | Wt 260.8 lb

## 2016-10-10 DIAGNOSIS — G894 Chronic pain syndrome: Secondary | ICD-10-CM | POA: Diagnosis not present

## 2016-10-10 DIAGNOSIS — R0789 Other chest pain: Secondary | ICD-10-CM | POA: Diagnosis not present

## 2016-10-10 DIAGNOSIS — E119 Type 2 diabetes mellitus without complications: Secondary | ICD-10-CM

## 2016-10-10 DIAGNOSIS — I10 Essential (primary) hypertension: Secondary | ICD-10-CM | POA: Diagnosis not present

## 2016-10-10 LAB — POCT GLYCOSYLATED HEMOGLOBIN (HGB A1C): HEMOGLOBIN A1C: 5.8

## 2016-10-10 MED ORDER — OXYCODONE HCL 10 MG PO TABS: 10.0000 mg | ORAL_TABLET | Freq: Three times a day (TID) | ORAL | 0 refills | Status: DC | PRN

## 2016-10-10 MED ORDER — MORPHINE SULFATE ER 30 MG PO TBCR: 30.0000 mg | EXTENDED_RELEASE_TABLET | Freq: Three times a day (TID) | ORAL | 0 refills | Status: DC
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
## 2016-10-21 ENCOUNTER — Other Ambulatory Visit: Payer: Self-pay | Admitting: Family Medicine

## 2017-01-09 ENCOUNTER — Ambulatory Visit (INDEPENDENT_AMBULATORY_CARE_PROVIDER_SITE_OTHER): Payer: PPO | Admitting: Family Medicine

## 2017-01-09 ENCOUNTER — Encounter: Payer: Self-pay | Admitting: Family Medicine

## 2017-01-09 VITALS — BP 142/76 | HR 79 | Temp 98.0°F | Resp 16 | Ht 70.0 in | Wt 268.0 lb

## 2017-01-09 DIAGNOSIS — E119 Type 2 diabetes mellitus without complications: Secondary | ICD-10-CM | POA: Diagnosis not present

## 2017-01-09 DIAGNOSIS — E7800 Pure hypercholesterolemia, unspecified: Secondary | ICD-10-CM | POA: Diagnosis not present

## 2017-01-09 DIAGNOSIS — I10 Essential (primary) hypertension: Secondary | ICD-10-CM

## 2017-01-09 DIAGNOSIS — R200 Anesthesia of skin: Secondary | ICD-10-CM | POA: Diagnosis not present

## 2017-01-09 DIAGNOSIS — M65341 Trigger finger, right ring finger: Secondary | ICD-10-CM | POA: Diagnosis not present

## 2017-01-09 DIAGNOSIS — H6121 Impacted cerumen, right ear: Secondary | ICD-10-CM

## 2017-01-09 DIAGNOSIS — Z0000 Encounter for general adult medical examination without abnormal findings: Secondary | ICD-10-CM | POA: Diagnosis not present

## 2017-01-09 DIAGNOSIS — G894 Chronic pain syndrome: Secondary | ICD-10-CM | POA: Diagnosis not present

## 2017-01-09 DIAGNOSIS — Z125 Encounter for screening for malignant neoplasm of prostate: Secondary | ICD-10-CM

## 2017-01-09 MED ORDER — OXYCODONE HCL 10 MG PO TABS: 10.0000 mg | ORAL_TABLET | Freq: Three times a day (TID) | ORAL | 0 refills | Status: DC | PRN

## 2017-01-09 MED ORDER — MORPHINE SULFATE ER 30 MG PO TBCR: 30.0000 mg | EXTENDED_RELEASE_TABLET | Freq: Three times a day (TID) | ORAL | 0 refills | Status: DC

## 2017-01-09 NOTE — Patient Instructions (Signed)
Arrange lab appt (fasting) at your earliest convenience.
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
## 2017-01-11 ENCOUNTER — Other Ambulatory Visit: Payer: Self-pay | Admitting: Family Medicine

## 2017-01-12 LAB — PAIN MGMT, PROFILE 8 W/CONF, U
6 Acetylmorphine: NEGATIVE ng/mL (ref <10)
ALCOHOL METABOLITES: NEGATIVE ng/mL (ref <500)
ALPHAHYDROXYMIDAZOLAM: NEGATIVE ng/mL (ref <50)
ALPHAHYDROXYTRIAZOLAM: NEGATIVE ng/mL (ref <50)
AMPHETAMINES: NEGATIVE ng/mL (ref <500)
Alphahydroxyalprazolam: NEGATIVE ng/mL (ref <25)
Benzodiazepines: POSITIVE ng/mL — AB (ref <100)
Buprenorphine, Urine: NEGATIVE ng/mL (ref <5)
CODEINE: NEGATIVE ng/mL (ref <50)
Cocaine Metabolite: NEGATIVE ng/mL (ref <150)
Creatinine: 134.6 mg/dL
HYDROMORPHONE: 250 ng/mL — AB (ref <50)
Hydrocodone: NEGATIVE ng/mL (ref <50)
Hydroxyethylflurazepam: NEGATIVE ng/mL (ref <50)
Lorazepam: NEGATIVE ng/mL (ref <50)
MDMA: NEGATIVE ng/mL (ref <500)
Marijuana Metabolite: NEGATIVE ng/mL (ref <20)
Morphine: >20000 ng/mL — ABNORMAL HIGH (ref <50)
NORHYDROCODONE: NEGATIVE ng/mL (ref <50)
NOROXYCODONE: 1154 ng/mL — AB (ref <50)
Nordiazepam: NEGATIVE ng/mL (ref <50)
OXAZEPAM: NEGATIVE ng/mL (ref <50)
OXIDANT: NEGATIVE ug/mL (ref <200)
OXYCODONE: 1087 ng/mL — AB (ref <50)
OXYCODONE: POSITIVE ng/mL — AB (ref <100)
Opiates: POSITIVE ng/mL — AB (ref <100)
Oxymorphone: 139 ng/mL — ABNORMAL HIGH (ref <50)
TEMAZEPAM: NEGATIVE ng/mL (ref <50)
pH: 6.55 (ref 4.5–9.0)

## 2017-01-17 ENCOUNTER — Ambulatory Visit (HOSPITAL_COMMUNITY): Admission: RE | Admit: 2017-01-17 | Payer: PPO | Source: Ambulatory Visit

## 2017-01-18 ENCOUNTER — Other Ambulatory Visit: Payer: Self-pay | Admitting: Family Medicine

## 2017-01-18 ENCOUNTER — Encounter: Payer: Self-pay | Admitting: Family Medicine

## 2017-01-18 NOTE — Telephone Encounter (Signed)
[facility]  RF request for clonazepam LOV: 01/09/17 Next ov: None Last written: 08/04/16 #60 w/ 5RF  Please advise. Thanks.

## 2017-01-18 NOTE — Telephone Encounter (Signed)
Rx faxed

## 2017-04-05 ENCOUNTER — Ambulatory Visit (INDEPENDENT_AMBULATORY_CARE_PROVIDER_SITE_OTHER): Payer: PPO | Admitting: Family Medicine

## 2017-04-05 ENCOUNTER — Encounter: Payer: Self-pay | Admitting: Family Medicine

## 2017-04-05 VITALS — BP 133/68 | HR 88 | Temp 97.7°F | Resp 16 | Wt 271.0 lb

## 2017-04-05 DIAGNOSIS — E119 Type 2 diabetes mellitus without complications: Secondary | ICD-10-CM

## 2017-04-05 DIAGNOSIS — G894 Chronic pain syndrome: Secondary | ICD-10-CM | POA: Diagnosis not present

## 2017-04-05 DIAGNOSIS — E7800 Pure hypercholesterolemia, unspecified: Secondary | ICD-10-CM | POA: Diagnosis not present

## 2017-04-05 DIAGNOSIS — I10 Essential (primary) hypertension: Secondary | ICD-10-CM

## 2017-04-05 DIAGNOSIS — M65341 Trigger finger, right ring finger: Secondary | ICD-10-CM

## 2017-04-05 DIAGNOSIS — M24541 Contracture, right hand: Secondary | ICD-10-CM | POA: Diagnosis not present

## 2017-04-05 LAB — COMPREHENSIVE METABOLIC PANEL
ALBUMIN: 4.3 g/dL (ref 3.5–5.2)
ALT: 45 U/L (ref 0–53)
AST: 24 U/L (ref 0–37)
Alkaline Phosphatase: 44 U/L (ref 39–117)
BILIRUBIN TOTAL: 0.7 mg/dL (ref 0.2–1.2)
BUN: 19 mg/dL (ref 6–23)
CHLORIDE: 95 meq/L — AB (ref 96–112)
CO2: 33 mEq/L — ABNORMAL HIGH (ref 19–32)
CREATININE: 1.11 mg/dL (ref 0.40–1.50)
Calcium: 9.8 mg/dL (ref 8.4–10.5)
GFR: 70.78 mL/min (ref >60.00)
Glucose, Bld: 124 mg/dL — ABNORMAL HIGH (ref 70–99)
Potassium: 4.2 mEq/L (ref 3.5–5.1)
SODIUM: 138 meq/L (ref 135–145)
TOTAL PROTEIN: 7.5 g/dL (ref 6.0–8.3)

## 2017-04-05 LAB — LIPID PANEL
CHOLESTEROL: 223 mg/dL — AB (ref 0–200)
HDL: 37.7 mg/dL — ABNORMAL LOW (ref >39.00)
NonHDL: 185.35
Total CHOL/HDL Ratio: 6
Triglycerides: 215 mg/dL — ABNORMAL HIGH (ref 0.0–149.0)
VLDL: 43 mg/dL — AB (ref 0.0–40.0)

## 2017-04-05 LAB — LDL CHOLESTEROL, DIRECT: Direct LDL: 168 mg/dL

## 2017-04-05 LAB — HEMOGLOBIN A1C: HEMOGLOBIN A1C: 6.2 % (ref 4.6–6.5)

## 2017-04-05 MED ORDER — MORPHINE SULFATE ER 30 MG PO TBCR: 30.0000 mg | EXTENDED_RELEASE_TABLET | Freq: Three times a day (TID) | ORAL | 0 refills | Status: DC

## 2017-04-05 MED ORDER — OXYCODONE HCL 10 MG PO TABS: 10.0000 mg | ORAL_TABLET | Freq: Three times a day (TID) | ORAL | 0 refills | Status: DC | PRN
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
## 2017-04-06 ENCOUNTER — Telehealth: Payer: Self-pay | Admitting: Family Medicine

## 2017-04-06 ENCOUNTER — Other Ambulatory Visit: Payer: Self-pay | Admitting: Family Medicine

## 2017-04-06 NOTE — Telephone Encounter (Signed)
Reviewed lab results and physician's note with patient. Stated he understood.

## 2017-06-26 ENCOUNTER — Encounter: Payer: Self-pay | Admitting: *Deleted

## 2017-06-27 ENCOUNTER — Encounter: Payer: Self-pay | Admitting: Family Medicine

## 2017-06-27 ENCOUNTER — Ambulatory Visit (INDEPENDENT_AMBULATORY_CARE_PROVIDER_SITE_OTHER): Payer: PPO | Admitting: Family Medicine

## 2017-06-27 VITALS — BP 112/73 | HR 80 | Temp 98.4°F | Resp 16 | Ht 70.0 in | Wt 274.0 lb

## 2017-06-27 DIAGNOSIS — I10 Essential (primary) hypertension: Secondary | ICD-10-CM

## 2017-06-27 DIAGNOSIS — G8929 Other chronic pain: Secondary | ICD-10-CM

## 2017-06-27 DIAGNOSIS — M170 Bilateral primary osteoarthritis of knee: Secondary | ICD-10-CM | POA: Diagnosis not present

## 2017-06-27 DIAGNOSIS — G894 Chronic pain syndrome: Secondary | ICD-10-CM | POA: Diagnosis not present

## 2017-06-27 DIAGNOSIS — M545 Low back pain, unspecified: Secondary | ICD-10-CM

## 2017-06-27 DIAGNOSIS — E119 Type 2 diabetes mellitus without complications: Secondary | ICD-10-CM | POA: Diagnosis not present

## 2017-06-27 DIAGNOSIS — N182 Chronic kidney disease, stage 2 (mild): Secondary | ICD-10-CM | POA: Diagnosis not present

## 2017-06-27 LAB — BASIC METABOLIC PANEL
BUN: 23 mg/dL (ref 6–23)
CHLORIDE: 95 meq/L — AB (ref 96–112)
CO2: 35 meq/L — AB (ref 19–32)
CREATININE: 1.33 mg/dL (ref 0.40–1.50)
Calcium: 8.2 mg/dL — ABNORMAL LOW (ref 8.4–10.5)
GFR: 57.41 mL/min — ABNORMAL LOW (ref >60.00)
GLUCOSE: 110 mg/dL — AB (ref 70–99)
POTASSIUM: 4.3 meq/L (ref 3.5–5.1)
Sodium: 137 mEq/L (ref 135–145)

## 2017-06-27 LAB — HEMOGLOBIN A1C: HEMOGLOBIN A1C: 6.3 % (ref 4.6–6.5)

## 2017-06-27 MED ORDER — MORPHINE SULFATE ER 30 MG PO TBCR: 30.0000 mg | EXTENDED_RELEASE_TABLET | Freq: Three times a day (TID) | ORAL | 0 refills | Status: DC

## 2017-06-27 MED ORDER — OXYCODONE HCL 10 MG PO TABS: 10.0000 mg | ORAL_TABLET | Freq: Three times a day (TID) | ORAL | 0 refills | Status: DC | PRN
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
## 2017-07-05 ENCOUNTER — Other Ambulatory Visit: Payer: Self-pay | Admitting: Family Medicine

## 2017-07-06 NOTE — Telephone Encounter (Signed)
[place]  RF request for clonazepam LOV: 06/27/17 Next ov: 09/24/17 Last written: 01/18/17 #60 w/ 5RF  Please advise. Thanks.

## 2017-07-06 NOTE — Telephone Encounter (Signed)
Rx faxed

## 2017-09-24 ENCOUNTER — Encounter: Payer: Self-pay | Admitting: Family Medicine

## 2017-09-24 ENCOUNTER — Ambulatory Visit (INDEPENDENT_AMBULATORY_CARE_PROVIDER_SITE_OTHER): Payer: PPO | Admitting: Family Medicine

## 2017-09-24 VITALS — BP 121/78 | HR 79 | Temp 98.4°F | Resp 16 | Ht 70.0 in | Wt 272.5 lb

## 2017-09-24 DIAGNOSIS — G8929 Other chronic pain: Secondary | ICD-10-CM | POA: Diagnosis not present

## 2017-09-24 DIAGNOSIS — G894 Chronic pain syndrome: Secondary | ICD-10-CM

## 2017-09-24 DIAGNOSIS — E669 Obesity, unspecified: Secondary | ICD-10-CM

## 2017-09-24 DIAGNOSIS — E119 Type 2 diabetes mellitus without complications: Secondary | ICD-10-CM | POA: Diagnosis not present

## 2017-09-24 DIAGNOSIS — M545 Low back pain: Secondary | ICD-10-CM

## 2017-09-24 DIAGNOSIS — I10 Essential (primary) hypertension: Secondary | ICD-10-CM | POA: Diagnosis not present

## 2017-09-24 DIAGNOSIS — R6882 Decreased libido: Secondary | ICD-10-CM

## 2017-09-24 DIAGNOSIS — M170 Bilateral primary osteoarthritis of knee: Secondary | ICD-10-CM

## 2017-09-24 LAB — BASIC METABOLIC PANEL
BUN: 23 mg/dL (ref 6–23)
CALCIUM: 9.5 mg/dL (ref 8.4–10.5)
CHLORIDE: 93 meq/L — AB (ref 96–112)
CO2: 32 meq/L (ref 19–32)
Creatinine, Ser: 1.23 mg/dL (ref 0.40–1.50)
GFR: 62.78 mL/min (ref >60.00)
Glucose, Bld: 107 mg/dL — ABNORMAL HIGH (ref 70–99)
Potassium: 4.2 mEq/L (ref 3.5–5.1)
SODIUM: 135 meq/L (ref 135–145)

## 2017-09-24 LAB — HEMOGLOBIN A1C: HEMOGLOBIN A1C: 6.5 % (ref 4.6–6.5)

## 2017-09-24 MED ORDER — MORPHINE SULFATE ER 30 MG PO TBCR: 30.0000 mg | EXTENDED_RELEASE_TABLET | Freq: Three times a day (TID) | ORAL | 0 refills | Status: DC

## 2017-09-24 MED ORDER — OXYCODONE HCL 10 MG PO TABS: 10.0000 mg | ORAL_TABLET | Freq: Three times a day (TID) | ORAL | 0 refills | Status: DC | PRN
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
## 2017-09-26 ENCOUNTER — Other Ambulatory Visit: Payer: Self-pay | Admitting: Family Medicine

## 2017-11-23 ENCOUNTER — Other Ambulatory Visit: Payer: Self-pay | Admitting: Family Medicine

## 2017-12-19 ENCOUNTER — Ambulatory Visit (INDEPENDENT_AMBULATORY_CARE_PROVIDER_SITE_OTHER): Payer: PPO | Admitting: Family Medicine

## 2017-12-19 ENCOUNTER — Encounter: Payer: Self-pay | Admitting: Family Medicine

## 2017-12-19 ENCOUNTER — Other Ambulatory Visit: Payer: Self-pay | Admitting: Family Medicine

## 2017-12-19 VITALS — BP 108/79 | HR 79 | Temp 98.5°F | Resp 16 | Ht 70.0 in | Wt 272.0 lb

## 2017-12-19 DIAGNOSIS — M25562 Pain in left knee: Secondary | ICD-10-CM | POA: Diagnosis not present

## 2017-12-19 DIAGNOSIS — G8929 Other chronic pain: Secondary | ICD-10-CM | POA: Diagnosis not present

## 2017-12-19 DIAGNOSIS — I10 Essential (primary) hypertension: Secondary | ICD-10-CM

## 2017-12-19 DIAGNOSIS — E119 Type 2 diabetes mellitus without complications: Secondary | ICD-10-CM

## 2017-12-19 DIAGNOSIS — M25561 Pain in right knee: Secondary | ICD-10-CM | POA: Diagnosis not present

## 2017-12-19 DIAGNOSIS — M170 Bilateral primary osteoarthritis of knee: Secondary | ICD-10-CM

## 2017-12-19 DIAGNOSIS — G894 Chronic pain syndrome: Secondary | ICD-10-CM | POA: Diagnosis not present

## 2017-12-19 DIAGNOSIS — E669 Obesity, unspecified: Secondary | ICD-10-CM | POA: Diagnosis not present

## 2017-12-19 DIAGNOSIS — Z79899 Other long term (current) drug therapy: Secondary | ICD-10-CM | POA: Diagnosis not present

## 2017-12-19 LAB — BASIC METABOLIC PANEL WITH GFR
BUN: 17 mg/dL (ref 6–23)
CO2: 30 meq/L (ref 19–32)
Calcium: 9.3 mg/dL (ref 8.4–10.5)
Chloride: 94 meq/L — ABNORMAL LOW (ref 96–112)
Creatinine, Ser: 1.1 mg/dL (ref 0.40–1.50)
GFR: 71.37 mL/min
Glucose, Bld: 115 mg/dL — ABNORMAL HIGH (ref 70–99)
Potassium: 4 meq/L (ref 3.5–5.1)
Sodium: 133 meq/L — ABNORMAL LOW (ref 135–145)

## 2017-12-19 LAB — HEMOGLOBIN A1C: HEMOGLOBIN A1C: 6.5 % (ref 4.6–6.5)

## 2017-12-19 MED ORDER — MORPHINE SULFATE ER 30 MG PO TBCR: 30.0000 mg | EXTENDED_RELEASE_TABLET | Freq: Three times a day (TID) | ORAL | 0 refills | Status: DC

## 2017-12-19 MED ORDER — OXYCODONE HCL 10 MG PO TABS: 10.0000 mg | ORAL_TABLET | Freq: Three times a day (TID) | ORAL | 0 refills | Status: DC | PRN

## 2017-12-19 MED ORDER — CLONAZEPAM 1 MG PO TABS: 1.0000 mg | ORAL_TABLET | Freq: Two times a day (BID) | ORAL | 5 refills | Status: DC | PRN
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
## 2017-12-23 LAB — PAIN MGMT, PROFILE 8 W/CONF, U
6 Acetylmorphine: NEGATIVE ng/mL (ref <10)
ALPHAHYDROXYMIDAZOLAM: NEGATIVE ng/mL (ref <50)
ALPHAHYDROXYTRIAZOLAM: NEGATIVE ng/mL (ref <50)
AMPHETAMINES: NEGATIVE ng/mL (ref <500)
Alcohol Metabolites: NEGATIVE ng/mL (ref <500)
Alphahydroxyalprazolam: NEGATIVE ng/mL (ref <25)
BUPRENORPHINE, URINE: NEGATIVE ng/mL (ref <5)
Benzodiazepines: POSITIVE ng/mL — AB (ref <100)
COCAINE METABOLITE: NEGATIVE ng/mL (ref <150)
CODEINE: NEGATIVE ng/mL (ref <50)
CREATININE: 201.3 mg/dL
Hydrocodone: NEGATIVE ng/mL (ref <50)
Hydromorphone: 449 ng/mL — ABNORMAL HIGH (ref <50)
Hydroxyethylflurazepam: NEGATIVE ng/mL (ref <50)
LORAZEPAM: 1769 ng/mL — AB (ref <50)
MDMA: NEGATIVE ng/mL (ref <500)
Marijuana Metabolite: NEGATIVE ng/mL (ref <20)
Morphine: >20000 ng/mL — ABNORMAL HIGH (ref <50)
NORHYDROCODONE: NEGATIVE ng/mL (ref <50)
Nordiazepam: NEGATIVE ng/mL (ref <50)
Noroxycodone: 2107 ng/mL — ABNORMAL HIGH (ref <50)
OXAZEPAM: NEGATIVE ng/mL (ref <50)
OXIDANT: NEGATIVE ug/mL (ref <200)
OXYCODONE: POSITIVE ng/mL — AB (ref <100)
Opiates: POSITIVE ng/mL — AB (ref <100)
Oxycodone: 669 ng/mL — ABNORMAL HIGH (ref <50)
Oxymorphone: 165 ng/mL — ABNORMAL HIGH (ref <50)
PH: 7 (ref 4.5–9.0)
Temazepam: NEGATIVE ng/mL (ref <50)

## 2018-03-14 ENCOUNTER — Other Ambulatory Visit: Payer: Self-pay | Admitting: Family Medicine

## 2018-03-14 DIAGNOSIS — G894 Chronic pain syndrome: Secondary | ICD-10-CM

## 2018-03-14 DIAGNOSIS — I10 Essential (primary) hypertension: Secondary | ICD-10-CM

## 2018-03-14 NOTE — Telephone Encounter (Signed)
RF request for morphine LOV: 12/19/17 Next ov: 03/25/18  Last written: 12/19/17 #90 w/ 0RF (given 3Rx's Sept, Oct, and Nov)  RF request for oxycodone Last written: 12/19/17 #90 w/ 0RF (given 3Rx's Sept, Oct and Nov)  Please advise. Thanks.

## 2018-03-14 NOTE — Telephone Encounter (Signed)
Pt advised that Rx's were sent to his pharmacy and to keep apt on 03/25/18.

## 2018-03-25 ENCOUNTER — Ambulatory Visit: Payer: PPO | Admitting: Family Medicine

## 2018-04-09 ENCOUNTER — Ambulatory Visit (INDEPENDENT_AMBULATORY_CARE_PROVIDER_SITE_OTHER): Payer: PPO | Admitting: Family Medicine

## 2018-04-09 ENCOUNTER — Encounter: Payer: Self-pay | Admitting: Family Medicine

## 2018-04-09 VITALS — BP 117/78 | HR 76 | Temp 98.4°F | Resp 16 | Ht 70.0 in | Wt 270.5 lb

## 2018-04-09 DIAGNOSIS — I10 Essential (primary) hypertension: Secondary | ICD-10-CM

## 2018-04-09 DIAGNOSIS — F4322 Adjustment disorder with anxiety: Secondary | ICD-10-CM

## 2018-04-09 DIAGNOSIS — E669 Obesity, unspecified: Secondary | ICD-10-CM

## 2018-04-09 DIAGNOSIS — M1711 Unilateral primary osteoarthritis, right knee: Secondary | ICD-10-CM | POA: Diagnosis not present

## 2018-04-09 DIAGNOSIS — F411 Generalized anxiety disorder: Secondary | ICD-10-CM | POA: Diagnosis not present

## 2018-04-09 DIAGNOSIS — M545 Low back pain, unspecified: Secondary | ICD-10-CM

## 2018-04-09 DIAGNOSIS — G894 Chronic pain syndrome: Secondary | ICD-10-CM

## 2018-04-09 DIAGNOSIS — G8929 Other chronic pain: Secondary | ICD-10-CM

## 2018-04-09 DIAGNOSIS — M25561 Pain in right knee: Secondary | ICD-10-CM | POA: Diagnosis not present

## 2018-04-09 MED ORDER — OXYCODONE HCL 10 MG PO TABS: ORAL_TABLET | ORAL | 0 refills | Status: DC

## 2018-04-09 MED ORDER — CLONAZEPAM 2 MG PO TABS: 2.0000 mg | ORAL_TABLET | Freq: Two times a day (BID) | ORAL | 1 refills | Status: DC

## 2018-04-09 MED ORDER — MORPHINE SULFATE ER 30 MG PO TBCR: 30.0000 mg | EXTENDED_RELEASE_TABLET | Freq: Three times a day (TID) | ORAL | 0 refills | Status: DC

## 2018-04-09 NOTE — Addendum Note (Signed)
Addended by: Tammi Sou on: 04/09/2018 01:41 PM   Modules accepted: Orders
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
## 2018-04-29 ENCOUNTER — Encounter: Payer: Self-pay | Admitting: Family Medicine

## 2018-04-29 ENCOUNTER — Emergency Department (HOSPITAL_COMMUNITY): Payer: PPO

## 2018-04-29 ENCOUNTER — Encounter (HOSPITAL_COMMUNITY): Payer: Self-pay | Admitting: *Deleted

## 2018-04-29 ENCOUNTER — Ambulatory Visit (INDEPENDENT_AMBULATORY_CARE_PROVIDER_SITE_OTHER): Payer: PPO | Admitting: Family Medicine

## 2018-04-29 ENCOUNTER — Other Ambulatory Visit: Payer: Self-pay

## 2018-04-29 ENCOUNTER — Inpatient Hospital Stay (HOSPITAL_COMMUNITY)
Admission: EM | Admit: 2018-04-29 | Discharge: 2018-05-01 | DRG: 190 | Disposition: A | Discharge status: Home / Self Care | Payer: PPO | Source: Ambulatory Visit | Attending: Internal Medicine | Admitting: Internal Medicine

## 2018-04-29 VITALS — BP 112/72 | HR 81 | Temp 97.9°F | Resp 16 | Ht 70.0 in | Wt 274.0 lb

## 2018-04-29 DIAGNOSIS — R410 Disorientation, unspecified: Secondary | ICD-10-CM | POA: Diagnosis not present

## 2018-04-29 DIAGNOSIS — R296 Repeated falls: Secondary | ICD-10-CM | POA: Diagnosis not present

## 2018-04-29 DIAGNOSIS — G43909 Migraine, unspecified, not intractable, without status migrainosus: Secondary | ICD-10-CM | POA: Diagnosis not present

## 2018-04-29 DIAGNOSIS — Z96652 Presence of left artificial knee joint: Secondary | ICD-10-CM | POA: Diagnosis present

## 2018-04-29 DIAGNOSIS — M5136 Other intervertebral disc degeneration, lumbar region: Secondary | ICD-10-CM | POA: Diagnosis present

## 2018-04-29 DIAGNOSIS — F418 Other specified anxiety disorders: Secondary | ICD-10-CM | POA: Diagnosis not present

## 2018-04-29 DIAGNOSIS — E878 Other disorders of electrolyte and fluid balance, not elsewhere classified: Secondary | ICD-10-CM | POA: Diagnosis present

## 2018-04-29 DIAGNOSIS — G894 Chronic pain syndrome: Secondary | ICD-10-CM | POA: Diagnosis not present

## 2018-04-29 DIAGNOSIS — R17 Unspecified jaundice: Secondary | ICD-10-CM | POA: Diagnosis not present

## 2018-04-29 DIAGNOSIS — M21371 Foot drop, right foot: Secondary | ICD-10-CM | POA: Diagnosis present

## 2018-04-29 DIAGNOSIS — G8929 Other chronic pain: Secondary | ICD-10-CM | POA: Diagnosis present

## 2018-04-29 DIAGNOSIS — J441 Chronic obstructive pulmonary disease with (acute) exacerbation: Secondary | ICD-10-CM | POA: Diagnosis not present

## 2018-04-29 DIAGNOSIS — E871 Hypo-osmolality and hyponatremia: Secondary | ICD-10-CM | POA: Diagnosis present

## 2018-04-29 DIAGNOSIS — R42 Dizziness and giddiness: Secondary | ICD-10-CM | POA: Diagnosis not present

## 2018-04-29 DIAGNOSIS — R0902 Hypoxemia: Secondary | ICD-10-CM

## 2018-04-29 DIAGNOSIS — J9601 Acute respiratory failure with hypoxia: Secondary | ICD-10-CM | POA: Diagnosis not present

## 2018-04-29 DIAGNOSIS — I2510 Atherosclerotic heart disease of native coronary artery without angina pectoris: Secondary | ICD-10-CM | POA: Diagnosis present

## 2018-04-29 DIAGNOSIS — J209 Acute bronchitis, unspecified: Secondary | ICD-10-CM | POA: Diagnosis present

## 2018-04-29 DIAGNOSIS — E119 Type 2 diabetes mellitus without complications: Secondary | ICD-10-CM | POA: Diagnosis not present

## 2018-04-29 DIAGNOSIS — N182 Chronic kidney disease, stage 2 (mild): Secondary | ICD-10-CM | POA: Diagnosis not present

## 2018-04-29 DIAGNOSIS — R7401 Elevation of levels of liver transaminase levels: Secondary | ICD-10-CM

## 2018-04-29 DIAGNOSIS — R630 Anorexia: Secondary | ICD-10-CM

## 2018-04-29 DIAGNOSIS — R0989 Other specified symptoms and signs involving the circulatory and respiratory systems: Secondary | ICD-10-CM | POA: Diagnosis not present

## 2018-04-29 DIAGNOSIS — R4182 Altered mental status, unspecified: Secondary | ICD-10-CM

## 2018-04-29 DIAGNOSIS — Z8249 Family history of ischemic heart disease and other diseases of the circulatory system: Secondary | ICD-10-CM

## 2018-04-29 DIAGNOSIS — Z7982 Long term (current) use of aspirin: Secondary | ICD-10-CM

## 2018-04-29 DIAGNOSIS — Z6839 Body mass index (BMI) 39.0-39.9, adult: Secondary | ICD-10-CM

## 2018-04-29 DIAGNOSIS — N189 Chronic kidney disease, unspecified: Secondary | ICD-10-CM

## 2018-04-29 DIAGNOSIS — J189 Pneumonia, unspecified organism: Secondary | ICD-10-CM | POA: Diagnosis not present

## 2018-04-29 DIAGNOSIS — J440 Chronic obstructive pulmonary disease with acute lower respiratory infection: Secondary | ICD-10-CM | POA: Diagnosis not present

## 2018-04-29 DIAGNOSIS — K219 Gastro-esophageal reflux disease without esophagitis: Secondary | ICD-10-CM | POA: Diagnosis present

## 2018-04-29 DIAGNOSIS — E1122 Type 2 diabetes mellitus with diabetic chronic kidney disease: Secondary | ICD-10-CM | POA: Diagnosis present

## 2018-04-29 DIAGNOSIS — E782 Mixed hyperlipidemia: Secondary | ICD-10-CM | POA: Diagnosis present

## 2018-04-29 DIAGNOSIS — I129 Hypertensive chronic kidney disease with stage 1 through stage 4 chronic kidney disease, or unspecified chronic kidney disease: Secondary | ICD-10-CM | POA: Diagnosis present

## 2018-04-29 DIAGNOSIS — M1990 Unspecified osteoarthritis, unspecified site: Secondary | ICD-10-CM | POA: Diagnosis not present

## 2018-04-29 DIAGNOSIS — N179 Acute kidney failure, unspecified: Secondary | ICD-10-CM | POA: Diagnosis not present

## 2018-04-29 DIAGNOSIS — S0990XA Unspecified injury of head, initial encounter: Secondary | ICD-10-CM | POA: Diagnosis not present

## 2018-04-29 DIAGNOSIS — J180 Bronchopneumonia, unspecified organism: Secondary | ICD-10-CM | POA: Diagnosis not present

## 2018-04-29 DIAGNOSIS — G40909 Epilepsy, unspecified, not intractable, without status epilepticus: Secondary | ICD-10-CM | POA: Diagnosis not present

## 2018-04-29 DIAGNOSIS — T424X5A Adverse effect of benzodiazepines, initial encounter: Secondary | ICD-10-CM | POA: Diagnosis present

## 2018-04-29 DIAGNOSIS — Z8719 Personal history of other diseases of the digestive system: Secondary | ICD-10-CM

## 2018-04-29 DIAGNOSIS — G92 Toxic encephalopathy: Secondary | ICD-10-CM | POA: Diagnosis not present

## 2018-04-29 DIAGNOSIS — Z801 Family history of malignant neoplasm of trachea, bronchus and lung: Secondary | ICD-10-CM | POA: Diagnosis not present

## 2018-04-29 DIAGNOSIS — I10 Essential (primary) hypertension: Secondary | ICD-10-CM | POA: Diagnosis not present

## 2018-04-29 DIAGNOSIS — R740 Nonspecific elevation of levels of transaminase and lactic acid dehydrogenase [LDH]: Secondary | ICD-10-CM

## 2018-04-29 DIAGNOSIS — M545 Low back pain: Secondary | ICD-10-CM

## 2018-04-29 DIAGNOSIS — R0602 Shortness of breath: Secondary | ICD-10-CM

## 2018-04-29 DIAGNOSIS — E875 Hyperkalemia: Secondary | ICD-10-CM | POA: Diagnosis not present

## 2018-04-29 DIAGNOSIS — Z66 Do not resuscitate: Secondary | ICD-10-CM | POA: Diagnosis present

## 2018-04-29 DIAGNOSIS — G9341 Metabolic encephalopathy: Secondary | ICD-10-CM

## 2018-04-29 DIAGNOSIS — T40605A Adverse effect of unspecified narcotics, initial encounter: Secondary | ICD-10-CM | POA: Diagnosis present

## 2018-04-29 DIAGNOSIS — R05 Cough: Secondary | ICD-10-CM | POA: Diagnosis not present

## 2018-04-29 DIAGNOSIS — E1140 Type 2 diabetes mellitus with diabetic neuropathy, unspecified: Secondary | ICD-10-CM | POA: Diagnosis present

## 2018-04-29 DIAGNOSIS — Z79891 Long term (current) use of opiate analgesic: Secondary | ICD-10-CM | POA: Diagnosis not present

## 2018-04-29 DIAGNOSIS — R531 Weakness: Secondary | ICD-10-CM | POA: Diagnosis not present

## 2018-04-29 DIAGNOSIS — E669 Obesity, unspecified: Secondary | ICD-10-CM | POA: Diagnosis present

## 2018-04-29 DIAGNOSIS — E860 Dehydration: Secondary | ICD-10-CM | POA: Diagnosis present

## 2018-04-29 LAB — URINALYSIS, ROUTINE W REFLEX MICROSCOPIC
Bilirubin Urine: NEGATIVE
Glucose, UA: NEGATIVE mg/dL
HGB URINE DIPSTICK: NEGATIVE
Ketones, ur: NEGATIVE mg/dL
Leukocytes, UA: NEGATIVE
Nitrite: NEGATIVE
PROTEIN: NEGATIVE mg/dL
Specific Gravity, Urine: 1.011 (ref 1.005–1.030)
pH: 6 (ref 5.0–8.0)

## 2018-04-29 LAB — RAPID URINE DRUG SCREEN, HOSP PERFORMED
Amphetamines: NOT DETECTED
Barbiturates: NOT DETECTED
Benzodiazepines: POSITIVE — AB
Cocaine: NOT DETECTED
Opiates: POSITIVE — AB
Tetrahydrocannabinol: NOT DETECTED

## 2018-04-29 LAB — CBC
HEMATOCRIT: 44.2 % (ref 39.0–52.0)
Hemoglobin: 14.7 g/dL (ref 13.0–17.0)
MCH: 31.5 pg (ref 26.0–34.0)
MCHC: 33.3 g/dL (ref 30.0–36.0)
MCV: 94.6 fL (ref 80.0–100.0)
Platelets: 192 10*3/uL (ref 150–400)
RBC: 4.67 MIL/uL (ref 4.22–5.81)
RDW: 13.1 % (ref 11.5–15.5)
WBC: 6.7 10*3/uL (ref 4.0–10.5)
nRBC: 0 % (ref 0.0–0.2)

## 2018-04-29 LAB — AMMONIA: Ammonia: 36 umol/L — ABNORMAL HIGH (ref 9–35)

## 2018-04-29 LAB — I-STAT TROPONIN, ED: TROPONIN I, POC: 0 ng/mL (ref 0.00–0.08)

## 2018-04-29 LAB — LACTIC ACID, PLASMA
Lactic Acid, Venous: 1.4 mmol/L (ref 0.5–1.9)
Lactic Acid, Venous: 1.4 mmol/L (ref 0.5–1.9)

## 2018-04-29 LAB — COMPREHENSIVE METABOLIC PANEL
ALT: 20 U/L (ref 0–44)
AST: 42 U/L — ABNORMAL HIGH (ref 15–41)
Albumin: 3.4 g/dL — ABNORMAL LOW (ref 3.5–5.0)
Alkaline Phosphatase: 39 U/L (ref 38–126)
Anion gap: 12 (ref 5–15)
BUN: 29 mg/dL — AB (ref 8–23)
CO2: 34 mmol/L — ABNORMAL HIGH (ref 22–32)
Calcium: 9 mg/dL (ref 8.9–10.3)
Chloride: 85 mmol/L — ABNORMAL LOW (ref 98–111)
Creatinine, Ser: 1.27 mg/dL — ABNORMAL HIGH (ref 0.61–1.24)
GFR calc Af Amer: >60 mL/min (ref >60)
GFR calc non Af Amer: 59 mL/min — ABNORMAL LOW (ref >60)
Glucose, Bld: 100 mg/dL — ABNORMAL HIGH (ref 70–99)
Potassium: 5.5 mmol/L — ABNORMAL HIGH (ref 3.5–5.1)
Sodium: 131 mmol/L — ABNORMAL LOW (ref 135–145)
Total Bilirubin: 1.9 mg/dL — ABNORMAL HIGH (ref 0.3–1.2)
Total Protein: 7 g/dL (ref 6.5–8.1)

## 2018-04-29 LAB — MAGNESIUM: Magnesium: 2.2 mg/dL (ref 1.7–2.4)

## 2018-04-29 LAB — BRAIN NATRIURETIC PEPTIDE: B Natriuretic Peptide: 14.6 pg/mL (ref 0.0–100.0)

## 2018-04-29 LAB — PHOSPHORUS: Phosphorus: 2.5 mg/dL (ref 2.5–4.6)

## 2018-04-29 LAB — GLUCOSE, POCT (MANUAL RESULT ENTRY): POC GLUCOSE: 147 mg/dL — AB (ref 70–99)

## 2018-04-29 LAB — TSH: TSH: 0.381 u[IU]/mL (ref 0.350–4.500)

## 2018-04-29 LAB — GLUCOSE, CAPILLARY: Glucose-Capillary: 165 mg/dL — ABNORMAL HIGH (ref 70–99)

## 2018-04-29 MED ORDER — BISACODYL 10 MG RE SUPP: 10.0000 mg | Freq: Every day | RECTAL | Status: DC | PRN

## 2018-04-29 MED ORDER — INSULIN ASPART 100 UNIT/ML ~~LOC~~ SOLN: 0.0000 [IU] | Freq: Three times a day (TID) | SUBCUTANEOUS | Status: DC

## 2018-04-29 MED ORDER — METHYLPREDNISOLONE SODIUM SUCC 40 MG IJ SOLR
40.0000 mg | Freq: Two times a day (BID) | INTRAMUSCULAR | Status: DC
  Administered 2018-04-30: 40 mg via INTRAVENOUS
  Filled 2018-04-29 (×2): qty 1

## 2018-04-29 MED ORDER — MORPHINE SULFATE ER 15 MG PO TBCR: 30.0000 mg | EXTENDED_RELEASE_TABLET | Freq: Two times a day (BID) | ORAL | Status: DC

## 2018-04-29 MED ORDER — ALBUTEROL SULFATE (2.5 MG/3ML) 0.083% IN NEBU
2.5000 mg | INHALATION_SOLUTION | RESPIRATORY_TRACT | Status: DC | PRN
  Administered 2018-04-30: 2.5 mg via RESPIRATORY_TRACT
  Filled 2018-04-29: qty 3

## 2018-04-29 MED ORDER — IPRATROPIUM-ALBUTEROL 0.5-2.5 (3) MG/3ML IN SOLN
3.0000 mL | Freq: Three times a day (TID) | RESPIRATORY_TRACT | Status: DC
  Administered 2018-04-30: 3 mL via RESPIRATORY_TRACT
  Filled 2018-04-29: qty 3

## 2018-04-29 MED ORDER — SODIUM CHLORIDE 0.9% FLUSH
3.0000 mL | Freq: Once | INTRAVENOUS | Status: AC
  Administered 2018-04-29: 3 mL via INTRAVENOUS

## 2018-04-29 MED ORDER — CLONAZEPAM 1 MG PO TABS
1.0000 mg | ORAL_TABLET | Freq: Two times a day (BID) | ORAL | Status: DC
  Administered 2018-04-29 – 2018-05-01 (×4): 1 mg via ORAL
  Filled 2018-04-29 (×4): qty 1

## 2018-04-29 MED ORDER — GUAIFENESIN ER 600 MG PO TB12
1200.0000 mg | ORAL_TABLET | Freq: Two times a day (BID) | ORAL | Status: DC
  Administered 2018-04-29 – 2018-05-01 (×4): 1200 mg via ORAL
  Filled 2018-04-29 (×4): qty 2

## 2018-04-29 MED ORDER — IOPAMIDOL (ISOVUE-370) INJECTION 76%
INTRAVENOUS | Status: AC
  Administered 2018-04-29: 17:00:00
  Filled 2018-04-29: qty 100

## 2018-04-29 MED ORDER — OXYCODONE HCL 5 MG PO TABS
10.0000 mg | ORAL_TABLET | Freq: Three times a day (TID) | ORAL | Status: DC | PRN
  Administered 2018-04-30 – 2018-05-01 (×2): 10 mg via ORAL
  Filled 2018-04-29 (×2): qty 2

## 2018-04-29 MED ORDER — ATORVASTATIN CALCIUM 80 MG PO TABS
80.0000 mg | ORAL_TABLET | Freq: Every day | ORAL | Status: DC
  Administered 2018-04-29 – 2018-04-30 (×2): 80 mg via ORAL
  Filled 2018-04-29 (×2): qty 1

## 2018-04-29 MED ORDER — METHYLPREDNISOLONE SODIUM SUCC 125 MG IJ SOLR
125.0000 mg | Freq: Once | INTRAMUSCULAR | Status: AC
  Administered 2018-04-29: 125 mg via INTRAVENOUS
  Filled 2018-04-29: qty 2

## 2018-04-29 MED ORDER — SODIUM CHLORIDE 0.9 % IV SOLN
100.0000 mg | Freq: Two times a day (BID) | INTRAVENOUS | Status: DC
  Administered 2018-04-30 – 2018-05-01 (×3): 100 mg via INTRAVENOUS
  Filled 2018-04-29 (×4): qty 100

## 2018-04-29 MED ORDER — ACETAMINOPHEN 325 MG PO TABS: 650.0000 mg | ORAL_TABLET | Freq: Four times a day (QID) | ORAL | Status: DC | PRN

## 2018-04-29 MED ORDER — DOXYCYCLINE HYCLATE 100 MG PO TABS
100.0000 mg | ORAL_TABLET | Freq: Once | ORAL | Status: AC
  Administered 2018-04-29: 100 mg via ORAL
  Filled 2018-04-29: qty 1

## 2018-04-29 MED ORDER — POLYETHYLENE GLYCOL 3350 17 G PO PACK: 17.0000 g | PACK | Freq: Every day | ORAL | Status: DC | PRN

## 2018-04-29 MED ORDER — ONDANSETRON HCL 4 MG/2ML IJ SOLN
4.0000 mg | Freq: Four times a day (QID) | INTRAMUSCULAR | Status: DC | PRN
  Filled 2018-04-29: qty 2

## 2018-04-29 MED ORDER — ACETAMINOPHEN 650 MG RE SUPP: 650.0000 mg | Freq: Four times a day (QID) | RECTAL | Status: DC | PRN

## 2018-04-29 MED ORDER — ALBUTEROL SULFATE (2.5 MG/3ML) 0.083% IN NEBU
2.5000 mg | INHALATION_SOLUTION | Freq: Four times a day (QID) | RESPIRATORY_TRACT | Status: DC
  Administered 2018-04-29: 2.5 mg via RESPIRATORY_TRACT
  Filled 2018-04-29: qty 3

## 2018-04-29 MED ORDER — IPRATROPIUM BROMIDE 0.02 % IN SOLN
0.5000 mg | Freq: Four times a day (QID) | RESPIRATORY_TRACT | Status: DC
  Administered 2018-04-29: 0.5 mg via RESPIRATORY_TRACT
  Filled 2018-04-29: qty 2.5

## 2018-04-29 MED ORDER — PANTOPRAZOLE SODIUM 40 MG PO TBEC
40.0000 mg | DELAYED_RELEASE_TABLET | Freq: Every day | ORAL | Status: DC
  Administered 2018-04-30 – 2018-05-01 (×2): 40 mg via ORAL
  Filled 2018-04-29 (×2): qty 1

## 2018-04-29 MED ORDER — ADULT MULTIVITAMIN W/MINERALS CH
1.0000 | ORAL_TABLET | Freq: Every day | ORAL | Status: DC
  Administered 2018-04-30 – 2018-05-01 (×2): 1 via ORAL
  Filled 2018-04-29 (×2): qty 1

## 2018-04-29 MED ORDER — ASPIRIN EC 81 MG PO TBEC
81.0000 mg | DELAYED_RELEASE_TABLET | Freq: Every day | ORAL | Status: DC
  Administered 2018-04-30 – 2018-05-01 (×2): 81 mg via ORAL
  Filled 2018-04-29 (×2): qty 1

## 2018-04-29 MED ORDER — SODIUM CHLORIDE 0.9 % IV SOLN
INTRAVENOUS | Status: DC
  Administered 2018-04-29: 22:00:00 via INTRAVENOUS

## 2018-04-29 MED ORDER — IPRATROPIUM-ALBUTEROL 0.5-2.5 (3) MG/3ML IN SOLN
3.0000 mL | Freq: Once | RESPIRATORY_TRACT | Status: AC
  Administered 2018-04-29: 3 mL via RESPIRATORY_TRACT
  Filled 2018-04-29: qty 3

## 2018-04-29 MED ORDER — METOPROLOL SUCCINATE ER 100 MG PO TB24
100.0000 mg | ORAL_TABLET | Freq: Every day | ORAL | Status: DC
  Administered 2018-04-30 – 2018-05-01 (×2): 100 mg via ORAL
  Filled 2018-04-29 (×2): qty 1

## 2018-04-29 MED ORDER — ONDANSETRON HCL 4 MG PO TABS: 4.0000 mg | ORAL_TABLET | Freq: Four times a day (QID) | ORAL | Status: DC | PRN

## 2018-04-29 MED ORDER — SUCRALFATE 1 G PO TABS: 1.0000 g | ORAL_TABLET | Freq: Two times a day (BID) | ORAL | Status: DC | PRN

## 2018-04-29 MED ORDER — MORPHINE SULFATE ER 30 MG PO TBCR
30.0000 mg | EXTENDED_RELEASE_TABLET | Freq: Two times a day (BID) | ORAL | Status: DC
  Administered 2018-04-30: 30 mg via ORAL
  Filled 2018-04-29: qty 2
  Filled 2018-04-29: qty 1

## 2018-04-29 MED ORDER — HEPARIN SODIUM (PORCINE) 5000 UNIT/ML IJ SOLN
5000.0000 [IU] | Freq: Three times a day (TID) | INTRAMUSCULAR | Status: DC
  Administered 2018-04-29 – 2018-04-30 (×2): 5000 [IU] via SUBCUTANEOUS
  Filled 2018-04-29 (×4): qty 1
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
## 2018-04-29 NOTE — ED Notes (Signed)
Pt back from imaging
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
## 2018-04-29 NOTE — Patient Instructions (Signed)
Pt needs to go be evaluated in the emergency department. Main points of concern are altered mental status, hypoxia, and respiratory illness recently.

## 2018-04-29 NOTE — ED Triage Notes (Addendum)
Chest congestion x 8 days had decreased appetite and sleeping a lot , fell last Thursday and then again on Friday  and not acting right , dizzy , last night had an episode of aloc, pt refused to come to hospital last night  And last night  his mental status is worse  , went to dr today  pulse ox not over 86 % ra, and sent here for further tests, wife feels like he is clearer today

## 2018-04-29 NOTE — ED Provider Notes (Signed)
  Physical Exam  BP 139/87 (BP Location: Left Arm)   Pulse 76   Temp 98.2 F (36.8 C) (Oral)   Resp 17   SpO2 94%   Physical Exam   Gen: in NAD Pulm: wet cough on exam. Rhonchi in RLL  ED Course/Procedures     Procedures  MDM   Pt signed out to me by B Henderly, PA-C.  Please see previous notes for further history.  In brief, patient presenting for evaluation of intermittent altered mental status for the past week.  Today he saw his PCP, was found to be hypoxic at 86% on room air.  Oxygen improved on 2 L via nasal cannula.  Upon arrival to the ER, patient alert and oriented x3.  Normal neuro exam.  Negative CT head.  Chest x-ray pending.  If normal, consider need for CTA to rule out PE or occult pneumonia.  Chest x-ray negative, as such, CTA ordered.  CTA negative for blood clot, does show bronchitis.  As such, will treat with prednisone, breathing treatment, and doxycycline.  Considering patient's new oxygen demand and intermittent altered mental status, will call for admission.  Discussed with Dr. Alfredia Ferguson from [facility], pt to be admitted.    Franchot Heidelberg, PA-C 04/29/18 Ward Givens, MD 04/30/18 1329

## 2018-04-30 ENCOUNTER — Observation Stay (HOSPITAL_COMMUNITY): Payer: PPO

## 2018-04-30 ENCOUNTER — Other Ambulatory Visit: Payer: Self-pay

## 2018-04-30 DIAGNOSIS — G92 Toxic encephalopathy: Secondary | ICD-10-CM | POA: Diagnosis present

## 2018-04-30 DIAGNOSIS — E782 Mixed hyperlipidemia: Secondary | ICD-10-CM | POA: Diagnosis present

## 2018-04-30 DIAGNOSIS — E871 Hypo-osmolality and hyponatremia: Secondary | ICD-10-CM | POA: Diagnosis present

## 2018-04-30 DIAGNOSIS — Z7982 Long term (current) use of aspirin: Secondary | ICD-10-CM | POA: Diagnosis not present

## 2018-04-30 DIAGNOSIS — M21371 Foot drop, right foot: Secondary | ICD-10-CM | POA: Diagnosis present

## 2018-04-30 DIAGNOSIS — K219 Gastro-esophageal reflux disease without esophagitis: Secondary | ICD-10-CM | POA: Diagnosis present

## 2018-04-30 DIAGNOSIS — J9601 Acute respiratory failure with hypoxia: Secondary | ICD-10-CM | POA: Diagnosis present

## 2018-04-30 DIAGNOSIS — G9341 Metabolic encephalopathy: Secondary | ICD-10-CM | POA: Diagnosis not present

## 2018-04-30 DIAGNOSIS — G40909 Epilepsy, unspecified, not intractable, without status epilepticus: Secondary | ICD-10-CM | POA: Diagnosis present

## 2018-04-30 DIAGNOSIS — Z96652 Presence of left artificial knee joint: Secondary | ICD-10-CM | POA: Diagnosis present

## 2018-04-30 DIAGNOSIS — M1990 Unspecified osteoarthritis, unspecified site: Secondary | ICD-10-CM | POA: Diagnosis present

## 2018-04-30 DIAGNOSIS — G894 Chronic pain syndrome: Secondary | ICD-10-CM | POA: Diagnosis present

## 2018-04-30 DIAGNOSIS — E1122 Type 2 diabetes mellitus with diabetic chronic kidney disease: Secondary | ICD-10-CM | POA: Diagnosis present

## 2018-04-30 DIAGNOSIS — J209 Acute bronchitis, unspecified: Secondary | ICD-10-CM | POA: Diagnosis not present

## 2018-04-30 DIAGNOSIS — E119 Type 2 diabetes mellitus without complications: Secondary | ICD-10-CM | POA: Diagnosis not present

## 2018-04-30 DIAGNOSIS — E1140 Type 2 diabetes mellitus with diabetic neuropathy, unspecified: Secondary | ICD-10-CM | POA: Diagnosis present

## 2018-04-30 DIAGNOSIS — J441 Chronic obstructive pulmonary disease with (acute) exacerbation: Secondary | ICD-10-CM | POA: Diagnosis present

## 2018-04-30 DIAGNOSIS — R17 Unspecified jaundice: Secondary | ICD-10-CM | POA: Diagnosis present

## 2018-04-30 DIAGNOSIS — Z8249 Family history of ischemic heart disease and other diseases of the circulatory system: Secondary | ICD-10-CM | POA: Diagnosis not present

## 2018-04-30 DIAGNOSIS — J440 Chronic obstructive pulmonary disease with acute lower respiratory infection: Secondary | ICD-10-CM | POA: Diagnosis present

## 2018-04-30 DIAGNOSIS — Z79891 Long term (current) use of opiate analgesic: Secondary | ICD-10-CM | POA: Diagnosis not present

## 2018-04-30 DIAGNOSIS — M5136 Other intervertebral disc degeneration, lumbar region: Secondary | ICD-10-CM | POA: Diagnosis present

## 2018-04-30 DIAGNOSIS — G43909 Migraine, unspecified, not intractable, without status migrainosus: Secondary | ICD-10-CM | POA: Diagnosis present

## 2018-04-30 DIAGNOSIS — Z801 Family history of malignant neoplasm of trachea, bronchus and lung: Secondary | ICD-10-CM | POA: Diagnosis not present

## 2018-04-30 DIAGNOSIS — N179 Acute kidney failure, unspecified: Secondary | ICD-10-CM | POA: Diagnosis present

## 2018-04-30 DIAGNOSIS — R4182 Altered mental status, unspecified: Secondary | ICD-10-CM | POA: Diagnosis present

## 2018-04-30 DIAGNOSIS — J189 Pneumonia, unspecified organism: Secondary | ICD-10-CM | POA: Diagnosis not present

## 2018-04-30 DIAGNOSIS — N182 Chronic kidney disease, stage 2 (mild): Secondary | ICD-10-CM | POA: Diagnosis present

## 2018-04-30 DIAGNOSIS — I129 Hypertensive chronic kidney disease with stage 1 through stage 4 chronic kidney disease, or unspecified chronic kidney disease: Secondary | ICD-10-CM | POA: Diagnosis present

## 2018-04-30 LAB — COMPREHENSIVE METABOLIC PANEL
ALT: 25 U/L (ref 0–44)
AST: 21 U/L (ref 15–41)
Albumin: 3.1 g/dL — ABNORMAL LOW (ref 3.5–5.0)
Alkaline Phosphatase: 41 U/L (ref 38–126)
Anion gap: 11 (ref 5–15)
BUN: 24 mg/dL — ABNORMAL HIGH (ref 8–23)
CO2: 34 mmol/L — ABNORMAL HIGH (ref 22–32)
Calcium: 8.8 mg/dL — ABNORMAL LOW (ref 8.9–10.3)
Chloride: 86 mmol/L — ABNORMAL LOW (ref 98–111)
Creatinine, Ser: 1.29 mg/dL — ABNORMAL HIGH (ref 0.61–1.24)
GFR calc Af Amer: >60 mL/min (ref >60)
GFR calc non Af Amer: 58 mL/min — ABNORMAL LOW (ref >60)
Glucose, Bld: 205 mg/dL — ABNORMAL HIGH (ref 70–99)
Potassium: 3.7 mmol/L (ref 3.5–5.1)
Sodium: 131 mmol/L — ABNORMAL LOW (ref 135–145)
Total Bilirubin: 0.9 mg/dL (ref 0.3–1.2)
Total Protein: 6.9 g/dL (ref 6.5–8.1)

## 2018-04-30 LAB — GLUCOSE, CAPILLARY
Glucose-Capillary: 184 mg/dL — ABNORMAL HIGH (ref 70–99)
Glucose-Capillary: 158 mg/dL — ABNORMAL HIGH (ref 70–99)
Glucose-Capillary: 140 mg/dL — ABNORMAL HIGH (ref 70–99)
Glucose-Capillary: 169 mg/dL — ABNORMAL HIGH (ref 70–99)

## 2018-04-30 LAB — HEMOGLOBIN A1C
Hgb A1c MFr Bld: 6.7 % — ABNORMAL HIGH (ref 4.8–5.6)
MEAN PLASMA GLUCOSE: 145.59 mg/dL

## 2018-04-30 LAB — RESPIRATORY PANEL BY PCR
Adenovirus: NOT DETECTED
Bordetella pertussis: NOT DETECTED
CORONAVIRUS 229E-RVPPCR: NOT DETECTED
CORONAVIRUS NL63-RVPPCR: NOT DETECTED
Chlamydophila pneumoniae: NOT DETECTED
Coronavirus HKU1: NOT DETECTED
Coronavirus OC43: NOT DETECTED
Influenza A: NOT DETECTED
Influenza B: NOT DETECTED
Metapneumovirus: NOT DETECTED
Mycoplasma pneumoniae: NOT DETECTED
Parainfluenza Virus 1: NOT DETECTED
Parainfluenza Virus 2: NOT DETECTED
Parainfluenza Virus 3: NOT DETECTED
Parainfluenza Virus 4: NOT DETECTED
Respiratory Syncytial Virus: NOT DETECTED
Rhinovirus / Enterovirus: NOT DETECTED

## 2018-04-30 LAB — CBC
HCT: 43.5 % (ref 39.0–52.0)
Hemoglobin: 14.4 g/dL (ref 13.0–17.0)
MCH: 31.1 pg (ref 26.0–34.0)
MCHC: 33.1 g/dL (ref 30.0–36.0)
MCV: 94 fL (ref 80.0–100.0)
Platelets: 177 10*3/uL (ref 150–400)
RBC: 4.63 MIL/uL (ref 4.22–5.81)
RDW: 12.8 % (ref 11.5–15.5)
WBC: 3.7 10*3/uL — ABNORMAL LOW (ref 4.0–10.5)
nRBC: 0 % (ref 0.0–0.2)

## 2018-04-30 LAB — URINE CULTURE: CULTURE: NO GROWTH

## 2018-04-30 LAB — HIV ANTIBODY (ROUTINE TESTING W REFLEX): HIV Screen 4th Generation wRfx: NONREACTIVE

## 2018-04-30 MED ORDER — PREDNISONE 20 MG PO TABS
40.0000 mg | ORAL_TABLET | Freq: Every day | ORAL | Status: DC
  Administered 2018-05-01: 40 mg via ORAL
  Filled 2018-04-30: qty 2

## 2018-04-30 MED ORDER — MORPHINE SULFATE ER 30 MG PO TBCR
30.0000 mg | EXTENDED_RELEASE_TABLET | Freq: Three times a day (TID) | ORAL | Status: DC
  Administered 2018-04-30 – 2018-05-01 (×3): 30 mg via ORAL
  Filled 2018-04-30 (×3): qty 1

## 2018-04-30 MED ORDER — IPRATROPIUM-ALBUTEROL 0.5-2.5 (3) MG/3ML IN SOLN
3.0000 mL | Freq: Two times a day (BID) | RESPIRATORY_TRACT | Status: DC
  Administered 2018-05-01: 3 mL via RESPIRATORY_TRACT
  Filled 2018-04-30 (×2): qty 3
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
## 2018-04-30 NOTE — Progress Notes (Signed)
Chaplain responded to spiritual care request for Advanced Directive.  Patient alert; [employer] bedside. Patient prefers to go by "[place]." Chaplain explained differences between a DNR and AD.  Patient began to doze during AD explanation. Chaplain left document for patient. Will follow up if needed. Tamsen Snider Pager [phone]

## 2018-04-30 NOTE — Evaluation (Signed)
Physical Therapy Evaluation Patient Details Name: Richard Levine MRN: [id number] DOB: Mar 10, 1951 Today's Date: 04/30/2018   History of Present Illness  Pt is a 68 y/o male admitted secondary to worsening SOB and cough, likely from bronchitis. CT of head negative for acute abnormality and imaging negative for PE. PMH includes chronic back pain, CKD, HTN, seizures, L TKA, and lumbar surgery.   Clinical Impression  Pt admitted secondary to problem above with deficits below. Pt with mild unsteadiness requiring min guard to supervision throughout mobility tasks. Educated about use of DME at home to increase safety. Oxygen sats briefly decreasing to 89% on RA, however, quickly elevated to >90% on RA and remained WFL throughout gait. Pt's wife and sons available to assist at d/c. Will continue to follow acutely to maximize functional mobility independence and safety.     Follow Up Recommendations No PT follow up;Supervision for mobility/OOB(pt refusing PT follow up )    Equipment Recommendations  None recommended by PT    Recommendations for Other Services       Precautions / Restrictions Precautions Precautions: Fall Restrictions Weight Bearing Restrictions: No      Mobility  Bed Mobility Overal bed mobility: Needs Assistance Bed Mobility: Supine to Sit     Supine to sit: Supervision     General bed mobility comments: Supervision for safety.   Transfers Overall transfer level: Needs assistance Equipment used: None Transfers: Sit to/from Stand Sit to Stand: Min guard         General transfer comment: Min guard for safety. Mild unsteadiness noted upon standing.   Ambulation/Gait Ambulation/Gait assistance: Min guard;Supervision Gait Distance (Feet): 150 Feet Assistive device: None Gait Pattern/deviations: Step-through pattern;Decreased stride length;Decreased weight shift to right;Antalgic;Drifts right/left Gait velocity: Decreased    General Gait Details: Slow, mildly  antalgic gait secondary to baseline R knee pain. Pt with mild unsteadiness and required min guard A at times for steadying. Pt reports feeling weaker and slightly more unsteady than normal. Educated about using RW vs cane initially until gait returns to baseline.   Stairs            Wheelchair Mobility    Modified Rankin (Stroke Patients Only)       Balance Overall balance assessment: Needs assistance Sitting-balance support: No upper extremity supported;Feet supported Sitting balance-Leahy Scale: Good     Standing balance support: No upper extremity supported;During functional activity Standing balance-Leahy Scale: Fair                               Pertinent Vitals/Pain Pain Assessment: Faces Faces Pain Scale: Hurts little more Pain Location: R knee  Pain Descriptors / Indicators: Aching Pain Intervention(s): Limited activity within patient's tolerance;Monitored during session;Repositioned    Home Living Family/patient expects to be discharged to:: Private residence Living Arrangements: Spouse/significant other;Children Available Help at Discharge: Family Type of Home: House Home Access: Stairs to enter Entrance Stairs-Rails: None Entrance Stairs-Number of Steps: 2 Home Layout: Two level;Able to live on main level with bedroom/bathroom Home Equipment: Gilford Rile - 2 wheels;Cane - single point      Prior Function Level of Independence: Independent with assistive device(s)         Comments: Uses cane for ambulation at times     Hand Dominance        Extremity/Trunk Assessment   Upper Extremity Assessment Upper Extremity Assessment: Overall WFL for tasks assessed    Lower Extremity Assessment Lower Extremity  Assessment: RLE deficits/detail RLE Deficits / Details: R knee pain at baseline    Cervical / Trunk Assessment Cervical / Trunk Assessment: Normal  Communication   Communication: No difficulties  Cognition Arousal/Alertness:  Awake/alert Behavior During Therapy: WFL for tasks assessed/performed Overall Cognitive Status: Within Functional Limits for tasks assessed                                        General Comments General comments (skin integrity, edema, etc.): Pt's son present during session. Oxygen sats ranging from 89-95% on RA throughout ambulation.     Exercises     Assessment/Plan    PT Assessment Patient needs continued PT services  PT Problem List Decreased strength;Decreased balance;Decreased mobility;Pain       PT Treatment Interventions DME instruction;Gait training;Stair training;Functional mobility training;Therapeutic activities;Therapeutic exercise;Balance training;Patient/family education    PT Goals (Current goals can be found in the Care Plan section)  Acute Rehab PT Goals Patient Stated Goal: to go home  PT Goal Formulation: With patient Time For Goal Achievement: 05/14/18 Potential to Achieve Goals: Good    Frequency Min 3X/week   Barriers to discharge        Co-evaluation               AM-PAC PT "6 Clicks" Mobility  Outcome Measure Help needed turning from your back to your side while in a flat bed without using bedrails?: None Help needed moving from lying on your back to sitting on the side of a flat bed without using bedrails?: None Help needed moving to and from a bed to a chair (including a wheelchair)?: A Little Help needed standing up from a chair using your arms (e.g., wheelchair or bedside chair)?: A Little Help needed to walk in hospital room?: A Little Help needed climbing 3-5 steps with a railing? : A Little 6 Click Score: 20    End of Session Equipment Utilized During Treatment: Gait belt Activity Tolerance: Patient tolerated treatment well Patient left: in chair;with call bell/phone within reach;with chair alarm set;with family/visitor present Nurse Communication: Mobility status PT Visit Diagnosis: Unsteadiness on feet  (R26.81)    Time: 7915-0569 PT Time Calculation (min) (ACUTE ONLY): 15 min   Charges:   PT Evaluation $PT Eval Low Complexity: [place], PT, DPT  Acute Rehabilitation Services  Pager: [phone] Office: [phone]   Rudean Hitt 04/30/2018, 1:28 PM

## 2018-04-30 NOTE — Progress Notes (Signed)
SATURATION QUALIFICATIONS: (This note is used to comply with regulatory documentation for home oxygen)  Patient Saturations on Room Air at Rest = 98%  Patient Saturations on Room Air while Ambulating = 91%    Please briefly explain why patient needs home oxygen: Pt sats briefly dropping to 89% on RA at beginning of ambulation, however, quickly increased to >90% on RA throughout rest of ambulation. Pt able to maintain adequate oxygen saturations on RA throughout mobility, therefore will not require supplemental oxygen.   Leighton Ruff, PT, DPT  Acute Rehabilitation Services  Pager: [phone] Office: [phone]

## 2018-05-01 LAB — BASIC METABOLIC PANEL
Anion gap: 11 (ref 5–15)
BUN: 18 mg/dL (ref 8–23)
CALCIUM: 8.9 mg/dL (ref 8.9–10.3)
CO2: 34 mmol/L — AB (ref 22–32)
Chloride: 91 mmol/L — ABNORMAL LOW (ref 98–111)
Creatinine, Ser: 1.01 mg/dL (ref 0.61–1.24)
GFR calc Af Amer: >60 mL/min (ref >60)
GFR calc non Af Amer: >60 mL/min (ref >60)
Glucose, Bld: 114 mg/dL — ABNORMAL HIGH (ref 70–99)
Potassium: 4.6 mmol/L (ref 3.5–5.1)
Sodium: 136 mmol/L (ref 135–145)

## 2018-05-01 LAB — GLUCOSE, CAPILLARY
Glucose-Capillary: 102 mg/dL — ABNORMAL HIGH (ref 70–99)
Glucose-Capillary: 172 mg/dL — ABNORMAL HIGH (ref 70–99)

## 2018-05-01 LAB — CBC
HCT: 41.4 % (ref 39.0–52.0)
Hemoglobin: 13.9 g/dL (ref 13.0–17.0)
MCH: 32.3 pg (ref 26.0–34.0)
MCHC: 33.6 g/dL (ref 30.0–36.0)
MCV: 96.1 fL (ref 80.0–100.0)
Platelets: 192 10*3/uL (ref 150–400)
RBC: 4.31 MIL/uL (ref 4.22–5.81)
RDW: 13.2 % (ref 11.5–15.5)
WBC: 11.4 10*3/uL — ABNORMAL HIGH (ref 4.0–10.5)
nRBC: 0 % (ref 0.0–0.2)

## 2018-05-01 MED ORDER — POLYETHYLENE GLYCOL 3350 17 G PO PACK: 17.0000 g | PACK | Freq: Every day | ORAL | 0 refills | Status: DC | PRN

## 2018-05-01 MED ORDER — DOXYCYCLINE HYCLATE 100 MG PO TABS: 100.0000 mg | ORAL_TABLET | Freq: Two times a day (BID) | ORAL | 0 refills | Status: AC

## 2018-05-01 MED ORDER — PREDNISONE 10 MG PO TABS: ORAL_TABLET | ORAL | 0 refills | Status: DC

## 2018-05-01 MED ORDER — GUAIFENESIN ER 600 MG PO TB12: 1200.0000 mg | ORAL_TABLET | Freq: Two times a day (BID) | ORAL | 0 refills | Status: DC

## 2018-05-01 MED ORDER — DOXYCYCLINE HYCLATE 100 MG PO TABS: 100.0000 mg | ORAL_TABLET | Freq: Two times a day (BID) | ORAL | Status: DC

## 2018-05-01 NOTE — Progress Notes (Signed)
Discharge instructions read and given to patient. Pt verbalize understanding. IV and telemetry removed. Belongings packed. Wife here to pick up pt. Pt transported out via wheelchair by NT.

## 2018-05-02 NOTE — Consult Note (Signed)
            [facility] CM Primary Care Navigator  05/02/2018  Richard Levine 11/12/1950 [id number]   Attempt to seepatient at the bedside to identify possible discharge needs buthe wasalreadydischargedhome.  Per MD note,patient presented with low oxygen level and confusion, was sent from his primary care provider's office.  (acute hypoxic respiratory failure in the setting acute bronchitis, acute encephalopathy in the setting of hypoxia,  hyponatremia/ hypochloremia, hyperkalemia)  Patient was noted with schedule to follow-up with primary care providerin a 1 week.  Primary care provider's office is listed as providing transition of care (TOC) follow-up.    For additional questions please contact:  Edwena Felty A. Kazoua Gossen, BSN, RN-BC [facility] PRIMARY CARE Navigator Cell: [phone]

## 2018-05-03 ENCOUNTER — Telehealth: Payer: Self-pay

## 2018-05-03 NOTE — Telephone Encounter (Signed)
LM requesting call back to complete TCM and schedule hospital follow up.   
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
## 2018-05-06 ENCOUNTER — Other Ambulatory Visit: Payer: Self-pay | Admitting: Pharmacist

## 2018-05-06 NOTE — Telephone Encounter (Signed)
Transition Care Management Follow-up Telephone Call  Admission: 04/29/2018-05/01/2018 Principal diagnosis: COPD exacerbation    How have you been since you were released from the hospital? "I'm still weak and tire easily, but better"   Do you understand why you were in the hospital? yes   Do you understand the discharge instructions? yes   Where were you discharged to? Home. Resides with wife.    Items Reviewed:  Medications reviewed: yes  Allergies reviewed: yes  Dietary changes reviewed: yes  Referrals reviewed: yes   Functional Questionnaire:   Activities of Daily Living (ADLs):   He states they are independent in the following: ambulation, bathing and hygiene, feeding, continence, grooming, toileting and dressing States they require assistance with the following: None   Any transportation issues/concerns?: no   Any patient concerns? No.    Confirmed importance and date/time of follow-up visits scheduled yes  Provider Appointment booked with PCP 04/14/2018  Confirmed with patient if condition begins to worsen call PCP or go to the ER.  Patient was given the office number and encouraged to call back with question or concerns.  : yes

## 2018-05-06 NOTE — Telephone Encounter (Signed)
Noted: nurse phone contact with patient for TCM. Signed:  Crissie Sickles, MD           05/06/2018

## 2018-05-06 NOTE — Patient Outreach (Signed)
[facility]) Care Management  05/06/2018  Richard Levine 07-12-1950 [id number]  68 year old male outreached by [facility] services for a 30 day post discharge medication review.  PMHx includes, but not limited to, GERD, hypertension, chronic kidney disease stage 2.   Unsuccessful outreach attempt #1 with no answer. Left HIPPA compliant voicemail for patient to call back.   I will route note for [facility] Joetta Manners to make another outreach attempt to patient within 3-4 business days.   Gwenlyn Found, Sherian Rein D PGY1 Pharmacy Resident  Phone [phone] 05/06/2018   10:06 AM

## 2018-05-10 ENCOUNTER — Ambulatory Visit: Payer: Self-pay

## 2018-05-10 ENCOUNTER — Other Ambulatory Visit: Payer: Self-pay

## 2018-05-10 NOTE — Patient Outreach (Signed)
[facility]) Care Management  [place]  05/10/2018  Richard Levine 1950-07-03 [id number]  Reason for call: post discharge medication review  Unsuccessful telephone call attempt # 2 to patient.   HIPAA compliant voicemail left requesting a return call.  Plan:  I will make another outreach attempt to patient within 3-4 business days.  Joetta Manners, PharmD Clinical Pharmacist [place] [phone]

## 2018-05-15 ENCOUNTER — Inpatient Hospital Stay: Payer: PPO | Admitting: Family Medicine

## 2018-05-15 ENCOUNTER — Ambulatory Visit: Payer: Self-pay

## 2018-05-15 ENCOUNTER — Other Ambulatory Visit: Payer: Self-pay

## 2018-05-15 NOTE — Patient Outreach (Signed)
[facility]) Care Management [place]  05/15/2018  Richard Levine [birth year] [id number]  Reason for referral: post discharge medication review  [facility] pharmacy case is being closed due to the following reasons:  We have been unable to establish and/or maintain contact with the patient.  Patient has been provided [facility] CM contact information if assistance needed in the future.    Thank you for allowing [facility] pharmacy to be involved in this patient's care.    Joetta Manners, PharmD Clinical Pharmacist [address] [phone]

## 2018-05-20 ENCOUNTER — Other Ambulatory Visit: Payer: Self-pay | Admitting: Family Medicine

## 2018-05-20 ENCOUNTER — Ambulatory Visit (INDEPENDENT_AMBULATORY_CARE_PROVIDER_SITE_OTHER): Payer: PPO | Admitting: Family Medicine

## 2018-05-20 ENCOUNTER — Encounter: Payer: Self-pay | Admitting: Family Medicine

## 2018-05-20 VITALS — BP 109/71 | HR 88 | Temp 98.1°F | Resp 16 | Ht 70.0 in | Wt 265.5 lb

## 2018-05-20 DIAGNOSIS — G894 Chronic pain syndrome: Secondary | ICD-10-CM

## 2018-05-20 DIAGNOSIS — J209 Acute bronchitis, unspecified: Secondary | ICD-10-CM

## 2018-05-20 DIAGNOSIS — R4182 Altered mental status, unspecified: Secondary | ICD-10-CM

## 2018-05-20 DIAGNOSIS — N179 Acute kidney failure, unspecified: Secondary | ICD-10-CM

## 2018-05-20 DIAGNOSIS — I10 Essential (primary) hypertension: Secondary | ICD-10-CM

## 2018-05-20 DIAGNOSIS — F411 Generalized anxiety disorder: Secondary | ICD-10-CM

## 2018-05-20 DIAGNOSIS — J9601 Acute respiratory failure with hypoxia: Secondary | ICD-10-CM

## 2018-05-20 MED ORDER — HYDROCHLOROTHIAZIDE 25 MG PO TABS: 25.0000 mg | ORAL_TABLET | Freq: Every day | ORAL | 1 refills | Status: DC

## 2018-05-20 MED ORDER — METOPROLOL SUCCINATE ER 100 MG PO TB24: ORAL_TABLET | ORAL | 1 refills | Status: DC

## 2018-05-20 MED ORDER — LISINOPRIL 10 MG PO TABS: 10.0000 mg | ORAL_TABLET | Freq: Every day | ORAL | 1 refills | Status: DC

## 2018-05-20 MED ORDER — PANTOPRAZOLE SODIUM 40 MG PO TBEC: 40.0000 mg | DELAYED_RELEASE_TABLET | Freq: Every day | ORAL | 1 refills | Status: DC

## 2018-05-20 MED ORDER — ATORVASTATIN CALCIUM 80 MG PO TABS: 80.0000 mg | ORAL_TABLET | Freq: Every day | ORAL | 1 refills | Status: DC
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
## 2018-05-20 NOTE — Telephone Encounter (Signed)
Copied from [place] [id number]. Topic: General - Other >> May 20, 2018  1:11 PM Lennox Solders wrote: Reason for CRM:pt is calling and he forgot to mention to dr Anitra Lauth he needs refills on ms contin 30 mg and oxycodone 10 mg.  Pt saw dr Anitra Lauth today. [facility] in [place]

## 2018-05-20 NOTE — Telephone Encounter (Signed)
LMOM that RX's for both medications were given to patient on 04/09/2018.  Both EY'E states to fill on or after 06/20/2018.  Patient should have RX's.

## 2018-06-27 ENCOUNTER — Telehealth: Payer: Self-pay | Admitting: Family Medicine

## 2018-06-27 NOTE — Telephone Encounter (Signed)
Copied from [place] [id number]. Topic: Quick Communication - See Telephone Encounter >> Jun 27, 2018  4:21 PM Loma Boston wrote: CRM for notification. See Telephone encounter for: 06/27/18.PT called and had spoke with the office and said that he was to call back and give his current information inorder to establish a web ex visit. All info was verified [email or web address] and his only phone [phone]. He will be waiting for a FU phone call on this appt.

## 2018-06-28 NOTE — Telephone Encounter (Signed)
Left VM for patient to download [employer]-emailed patient link to meeting

## 2018-07-04 ENCOUNTER — Encounter: Payer: Self-pay | Admitting: Family Medicine

## 2018-07-04 ENCOUNTER — Ambulatory Visit (INDEPENDENT_AMBULATORY_CARE_PROVIDER_SITE_OTHER): Payer: PPO | Admitting: Family Medicine

## 2018-07-04 ENCOUNTER — Other Ambulatory Visit: Payer: Self-pay

## 2018-07-04 VITALS — BP 182/86 | HR 77 | Temp 98.2°F

## 2018-07-04 DIAGNOSIS — M170 Bilateral primary osteoarthritis of knee: Secondary | ICD-10-CM | POA: Diagnosis not present

## 2018-07-04 DIAGNOSIS — G894 Chronic pain syndrome: Secondary | ICD-10-CM

## 2018-07-04 DIAGNOSIS — G8929 Other chronic pain: Secondary | ICD-10-CM

## 2018-07-04 DIAGNOSIS — I10 Essential (primary) hypertension: Secondary | ICD-10-CM | POA: Diagnosis not present

## 2018-07-04 DIAGNOSIS — M545 Low back pain, unspecified: Secondary | ICD-10-CM

## 2018-07-04 MED ORDER — MORPHINE SULFATE ER 30 MG PO TBCR: 30.0000 mg | EXTENDED_RELEASE_TABLET | Freq: Three times a day (TID) | ORAL | 0 refills | Status: DC

## 2018-07-04 MED ORDER — OXYCODONE HCL 10 MG PO TABS: ORAL_TABLET | ORAL | 0 refills | Status: DC
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
## 2018-07-06 ENCOUNTER — Other Ambulatory Visit: Payer: Self-pay | Admitting: Family Medicine

## 2018-07-08 NOTE — Telephone Encounter (Signed)
RF request for clonazepam. Last OV 07/04/2018 No upcoming appt Last RX printed 04/09/2018 # 180 x 1 rf.  Please advise.

## 2018-07-08 NOTE — Telephone Encounter (Signed)
I'm going to deny this RF. Pls notify pt that he should have a RF at the pharmacy already.-thx

## 2018-07-09 NOTE — Telephone Encounter (Signed)
Pt was made aware of refill

## 2018-07-18 ENCOUNTER — Other Ambulatory Visit: Payer: Self-pay

## 2018-07-18 ENCOUNTER — Encounter: Payer: Self-pay | Admitting: Family Medicine

## 2018-07-18 ENCOUNTER — Ambulatory Visit (INDEPENDENT_AMBULATORY_CARE_PROVIDER_SITE_OTHER): Payer: PPO | Admitting: Family Medicine

## 2018-07-18 VITALS — BP 123/84

## 2018-07-18 DIAGNOSIS — R6889 Other general symptoms and signs: Secondary | ICD-10-CM

## 2018-07-18 DIAGNOSIS — G894 Chronic pain syndrome: Secondary | ICD-10-CM | POA: Diagnosis not present

## 2018-07-18 DIAGNOSIS — Z20822 Contact with and (suspected) exposure to covid-19: Secondary | ICD-10-CM
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
## 2018-08-14 ENCOUNTER — Other Ambulatory Visit: Payer: Self-pay | Admitting: Family Medicine

## 2018-08-14 DIAGNOSIS — I10 Essential (primary) hypertension: Secondary | ICD-10-CM

## 2018-08-14 DIAGNOSIS — G894 Chronic pain syndrome: Secondary | ICD-10-CM

## 2018-08-14 MED ORDER — OXYCODONE HCL 10 MG PO TABS: ORAL_TABLET | ORAL | 0 refills | Status: DC

## 2018-08-14 MED ORDER — MORPHINE SULFATE ER 30 MG PO TBCR: 30.0000 mg | EXTENDED_RELEASE_TABLET | Freq: Three times a day (TID) | ORAL | 0 refills | Status: DC

## 2018-08-14 NOTE — Telephone Encounter (Signed)
RF request for Morphine and Oxycodone  LOV: 07/18/2018 Next ov: 08/22/2018 Last written:.07/18/2018? Last note says filled one day early but under medication it states 07/04/2018.   Please advise.

## 2018-08-14 NOTE — Telephone Encounter (Signed)
Copied from [place] [id number]. Topic: Quick Communication - See Telephone Encounter >> Aug 14, 2018  1:09 PM Vernona Rieger wrote: CRM for notification. See Telephone encounter for: 08/14/18. Patient states yesterday he spoke with Diane and she was going to let him know if Dr Anitra Lauth was going to refill his medications since his appt is not until 5/28. Please advise

## 2018-08-14 NOTE — Telephone Encounter (Signed)
Reviewed med info in EMR and reviewed my last note: This is a request for RF b/c it will be a few days early (I authorized his most recent RF a couple days early b/c he was going to [place] and was expecting to be quarantined there for 2 wks).  I will allow early RF.  Pt has scheduled f/u in about 1 wk.  Signed:  Crissie Sickles, MD           08/14/2018

## 2018-08-22 ENCOUNTER — Ambulatory Visit: Payer: PPO | Admitting: Family Medicine

## 2018-08-26 ENCOUNTER — Encounter: Payer: Self-pay | Admitting: Family Medicine

## 2018-08-26 ENCOUNTER — Other Ambulatory Visit: Payer: Self-pay

## 2018-08-26 ENCOUNTER — Ambulatory Visit (INDEPENDENT_AMBULATORY_CARE_PROVIDER_SITE_OTHER): Payer: PPO | Admitting: Family Medicine

## 2018-08-26 VITALS — BP 125/83 | Temp 98.4°F | Wt 251.0 lb

## 2018-08-26 DIAGNOSIS — E119 Type 2 diabetes mellitus without complications: Secondary | ICD-10-CM

## 2018-08-26 DIAGNOSIS — G894 Chronic pain syndrome: Secondary | ICD-10-CM

## 2018-08-26 DIAGNOSIS — E7800 Pure hypercholesterolemia, unspecified: Secondary | ICD-10-CM | POA: Diagnosis not present

## 2018-08-26 DIAGNOSIS — I10 Essential (primary) hypertension: Secondary | ICD-10-CM

## 2018-08-26 DIAGNOSIS — M170 Bilateral primary osteoarthritis of knee: Secondary | ICD-10-CM | POA: Diagnosis not present

## 2018-08-26 MED ORDER — OXYCODONE HCL 10 MG PO TABS: ORAL_TABLET | ORAL | 0 refills | Status: DC

## 2018-08-26 MED ORDER — LISINOPRIL 10 MG PO TABS: 10.0000 mg | ORAL_TABLET | Freq: Every day | ORAL | 1 refills | Status: DC

## 2018-08-26 MED ORDER — MORPHINE SULFATE ER 30 MG PO TBCR: 30.0000 mg | EXTENDED_RELEASE_TABLET | Freq: Three times a day (TID) | ORAL | 0 refills | Status: DC
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
## 2018-09-03 ENCOUNTER — Ambulatory Visit (INDEPENDENT_AMBULATORY_CARE_PROVIDER_SITE_OTHER): Payer: PPO

## 2018-09-03 ENCOUNTER — Other Ambulatory Visit: Payer: Self-pay

## 2018-09-03 DIAGNOSIS — I10 Essential (primary) hypertension: Secondary | ICD-10-CM | POA: Diagnosis not present

## 2018-09-03 DIAGNOSIS — E119 Type 2 diabetes mellitus without complications: Secondary | ICD-10-CM

## 2018-09-03 DIAGNOSIS — E7800 Pure hypercholesterolemia, unspecified: Secondary | ICD-10-CM | POA: Diagnosis not present

## 2018-09-03 LAB — BASIC METABOLIC PANEL
BUN: 15 mg/dL (ref 6–23)
CO2: 31 mEq/L (ref 19–32)
Calcium: 9.2 mg/dL (ref 8.4–10.5)
Chloride: 92 mEq/L — ABNORMAL LOW (ref 96–112)
Creatinine, Ser: 1.01 mg/dL (ref 0.40–1.50)
GFR: 73.94 mL/min (ref >60.00)
Glucose, Bld: 108 mg/dL — ABNORMAL HIGH (ref 70–99)
Potassium: 4.1 mEq/L (ref 3.5–5.1)
Sodium: 132 mEq/L — ABNORMAL LOW (ref 135–145)

## 2018-09-03 LAB — LIPID PANEL
Cholesterol: 215 mg/dL — ABNORMAL HIGH (ref 0–200)
HDL: 38.7 mg/dL — ABNORMAL LOW (ref >39.00)
LDL Cholesterol: 146 mg/dL — ABNORMAL HIGH (ref 0–99)
NonHDL: 175.96
Total CHOL/HDL Ratio: 6
Triglycerides: 149 mg/dL (ref 0.0–149.0)
VLDL: 29.8 mg/dL (ref 0.0–40.0)

## 2018-09-03 LAB — HEMOGLOBIN A1C: Hgb A1c MFr Bld: 6.3 % (ref 4.6–6.5)

## 2018-09-25 DIAGNOSIS — F29 Unspecified psychosis not due to a substance or known physiological condition: Secondary | ICD-10-CM

## 2018-09-25 HISTORY — DX: Unspecified psychosis not due to a substance or known physiological condition: F29

## 2018-09-26 ENCOUNTER — Encounter (HOSPITAL_COMMUNITY): Payer: Self-pay | Admitting: Emergency Medicine

## 2018-09-26 ENCOUNTER — Emergency Department (HOSPITAL_COMMUNITY)
Admission: EM | Admit: 2018-09-26 | Discharge: 2018-09-27 | Disposition: A | Discharge status: Home / Self Care | Payer: PPO | Attending: Emergency Medicine | Admitting: Emergency Medicine

## 2018-09-26 ENCOUNTER — Other Ambulatory Visit: Payer: Self-pay

## 2018-09-26 ENCOUNTER — Emergency Department (HOSPITAL_COMMUNITY): Payer: PPO

## 2018-09-26 DIAGNOSIS — F23 Brief psychotic disorder: Secondary | ICD-10-CM | POA: Insufficient documentation

## 2018-09-26 DIAGNOSIS — Z20828 Contact with and (suspected) exposure to other viral communicable diseases: Secondary | ICD-10-CM | POA: Diagnosis not present

## 2018-09-26 DIAGNOSIS — Z03818 Encounter for observation for suspected exposure to other biological agents ruled out: Secondary | ICD-10-CM | POA: Diagnosis not present

## 2018-09-26 DIAGNOSIS — N171 Acute kidney failure with acute cortical necrosis: Secondary | ICD-10-CM | POA: Diagnosis not present

## 2018-09-26 DIAGNOSIS — Z79899 Other long term (current) drug therapy: Secondary | ICD-10-CM | POA: Diagnosis not present

## 2018-09-26 DIAGNOSIS — Z7982 Long term (current) use of aspirin: Secondary | ICD-10-CM | POA: Diagnosis not present

## 2018-09-26 DIAGNOSIS — F29 Unspecified psychosis not due to a substance or known physiological condition: Secondary | ICD-10-CM

## 2018-09-26 DIAGNOSIS — Z96652 Presence of left artificial knee joint: Secondary | ICD-10-CM | POA: Diagnosis not present

## 2018-09-26 DIAGNOSIS — I10 Essential (primary) hypertension: Secondary | ICD-10-CM | POA: Diagnosis not present

## 2018-09-26 DIAGNOSIS — E119 Type 2 diabetes mellitus without complications: Secondary | ICD-10-CM | POA: Insufficient documentation

## 2018-09-26 DIAGNOSIS — R441 Visual hallucinations: Secondary | ICD-10-CM | POA: Diagnosis present

## 2018-09-26 LAB — COMPREHENSIVE METABOLIC PANEL
ALT: 26 U/L (ref 0–44)
AST: 21 U/L (ref 15–41)
Albumin: 3.7 g/dL (ref 3.5–5.0)
Alkaline Phosphatase: 50 U/L (ref 38–126)
Anion gap: 15 (ref 5–15)
BUN: 20 mg/dL (ref 8–23)
CO2: 28 mmol/L (ref 22–32)
Calcium: 9.7 mg/dL (ref 8.9–10.3)
Chloride: 95 mmol/L — ABNORMAL LOW (ref 98–111)
Creatinine, Ser: 1.25 mg/dL — ABNORMAL HIGH (ref 0.61–1.24)
GFR calc Af Amer: >60 mL/min (ref >60)
GFR calc non Af Amer: >60 mL/min (ref >60)
Glucose, Bld: 128 mg/dL — ABNORMAL HIGH (ref 70–99)
Potassium: 4.2 mmol/L (ref 3.5–5.1)
Sodium: 138 mmol/L (ref 135–145)
Total Bilirubin: 0.6 mg/dL (ref 0.3–1.2)
Total Protein: 6.9 g/dL (ref 6.5–8.1)

## 2018-09-26 LAB — RAPID URINE DRUG SCREEN, HOSP PERFORMED
Amphetamines: NOT DETECTED
Barbiturates: NOT DETECTED
Benzodiazepines: POSITIVE — AB
Cocaine: NOT DETECTED
Opiates: POSITIVE — AB
Tetrahydrocannabinol: NOT DETECTED

## 2018-09-26 LAB — CBC WITH DIFFERENTIAL/PLATELET
Abs Immature Granulocytes: 0.03 10*3/uL (ref 0.00–0.07)
Basophils Absolute: 0 10*3/uL (ref 0.0–0.1)
Basophils Relative: 0 %
Eosinophils Absolute: 0.2 10*3/uL (ref 0.0–0.5)
Eosinophils Relative: 2 %
HCT: 41.2 % (ref 39.0–52.0)
Hemoglobin: 14.2 g/dL (ref 13.0–17.0)
Immature Granulocytes: 0 %
Lymphocytes Relative: 16 %
Lymphs Abs: 1.4 10*3/uL (ref 0.7–4.0)
MCH: 32.3 pg (ref 26.0–34.0)
MCHC: 34.5 g/dL (ref 30.0–36.0)
MCV: 93.8 fL (ref 80.0–100.0)
Monocytes Absolute: 1 10*3/uL (ref 0.1–1.0)
Monocytes Relative: 11 %
Neutro Abs: 6.4 10*3/uL (ref 1.7–7.7)
Neutrophils Relative %: 71 %
Platelets: 196 10*3/uL (ref 150–400)
RBC: 4.39 MIL/uL (ref 4.22–5.81)
RDW: 12.2 % (ref 11.5–15.5)
WBC: 9 10*3/uL (ref 4.0–10.5)
nRBC: 0 % (ref 0.0–0.2)

## 2018-09-26 LAB — URINALYSIS, ROUTINE W REFLEX MICROSCOPIC
Bilirubin Urine: NEGATIVE
Glucose, UA: NEGATIVE mg/dL
Hgb urine dipstick: NEGATIVE
Ketones, ur: NEGATIVE mg/dL
Leukocytes,Ua: NEGATIVE
Nitrite: NEGATIVE
Protein, ur: NEGATIVE mg/dL
Specific Gravity, Urine: 1.016 (ref 1.005–1.030)
pH: 6 (ref 5.0–8.0)

## 2018-09-26 LAB — SARS CORONAVIRUS 2 BY RT PCR (HOSPITAL ORDER, PERFORMED IN ~~LOC~~ HOSPITAL LAB): SARS Coronavirus 2: NEGATIVE

## 2018-09-26 LAB — ETHANOL: Alcohol, Ethyl (B): <10 mg/dL (ref <10)

## 2018-09-26 MED ORDER — ACETAMINOPHEN 325 MG PO TABS: 650.0000 mg | ORAL_TABLET | ORAL | Status: DC | PRN

## 2018-09-26 MED ORDER — HYDROCHLOROTHIAZIDE 25 MG PO TABS
25.0000 mg | ORAL_TABLET | Freq: Every day | ORAL | Status: DC
  Administered 2018-09-27: 25 mg via ORAL
  Filled 2018-09-26 (×2): qty 1

## 2018-09-26 MED ORDER — METOPROLOL SUCCINATE ER 25 MG PO TB24
100.0000 mg | ORAL_TABLET | Freq: Every day | ORAL | Status: DC
  Administered 2018-09-27: 100 mg via ORAL
  Filled 2018-09-26: qty 4

## 2018-09-26 MED ORDER — ASPIRIN EC 81 MG PO TBEC
81.0000 mg | DELAYED_RELEASE_TABLET | Freq: Every day | ORAL | Status: DC
  Administered 2018-09-26 – 2018-09-27 (×2): 81 mg via ORAL
  Filled 2018-09-26 (×2): qty 1

## 2018-09-26 MED ORDER — MORPHINE SULFATE ER 30 MG PO TBCR
30.0000 mg | EXTENDED_RELEASE_TABLET | Freq: Three times a day (TID) | ORAL | Status: DC
  Administered 2018-09-26 – 2018-09-27 (×4): 30 mg via ORAL
  Filled 2018-09-26 (×4): qty 1

## 2018-09-26 MED ORDER — LISINOPRIL 10 MG PO TABS
10.0000 mg | ORAL_TABLET | Freq: Every day | ORAL | Status: DC
  Administered 2018-09-27: 10 mg via ORAL
  Filled 2018-09-26: qty 1

## 2018-09-26 MED ORDER — OLANZAPINE 5 MG PO TBDP
10.0000 mg | ORAL_TABLET | Freq: Three times a day (TID) | ORAL | Status: DC | PRN
  Administered 2018-09-27: 10 mg via ORAL
  Filled 2018-09-26: qty 2

## 2018-09-26 MED ORDER — PANTOPRAZOLE SODIUM 40 MG PO TBEC
40.0000 mg | DELAYED_RELEASE_TABLET | Freq: Every day | ORAL | Status: DC
  Administered 2018-09-27: 40 mg via ORAL
  Filled 2018-09-26: qty 1

## 2018-09-26 MED ORDER — ATORVASTATIN CALCIUM 40 MG PO TABS
80.0000 mg | ORAL_TABLET | Freq: Every day | ORAL | Status: DC
  Administered 2018-09-27: 80 mg via ORAL
  Filled 2018-09-26 (×2): qty 2

## 2018-09-26 MED ORDER — ZIPRASIDONE MESYLATE 20 MG IM SOLR: 20.0000 mg | INTRAMUSCULAR | Status: DC | PRN

## 2018-09-26 MED ORDER — LORAZEPAM 1 MG PO TABS: 1.0000 mg | ORAL_TABLET | ORAL | Status: DC | PRN

## 2018-09-26 MED ORDER — CLONAZEPAM 0.5 MG PO TABS
2.0000 mg | ORAL_TABLET | Freq: Two times a day (BID) | ORAL | Status: DC
  Administered 2018-09-26 – 2018-09-27 (×3): 2 mg via ORAL
  Filled 2018-09-26 (×3): qty 4

## 2018-09-26 MED ORDER — OXYCODONE HCL 5 MG PO TABS
10.0000 mg | ORAL_TABLET | Freq: Three times a day (TID) | ORAL | Status: DC | PRN
  Administered 2018-09-27: 10 mg via ORAL
  Filled 2018-09-26: qty 2

## 2018-09-26 MED ORDER — ONDANSETRON HCL 4 MG PO TABS: 4.0000 mg | ORAL_TABLET | Freq: Three times a day (TID) | ORAL | Status: DC | PRN
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
## 2018-09-26 NOTE — ED Notes (Signed)
Wife called and was updated Wife suzanne [phone]

## 2018-09-26 NOTE — ED Notes (Signed)
Pt states he does not need to urinate at this time, aware of DO  

## 2018-09-26 NOTE — ED Notes (Addendum)
Pt ambulated to BR without difficulty. Pt back in room sitting on side of bed. Calm and cooperative
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
## 2018-09-26 NOTE — [facility] Counselor (Signed)
  [place] ASSESSMENT Disposition:  [facility] discussed case with [place] Provider, Mordecai Maes, NP who recommends inpatient treatment. TTS will look for placement.  Landen Breeland L. [place], [place], [facility], [facility] Therapeutic Triage Specialist  [phone]
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
## 2018-09-26 NOTE — ED Notes (Signed)
Pt refused ct.

## 2018-09-26 NOTE — Progress Notes (Signed)
Pt. meets criteria for inpatient treatment per Mordecai Maes, NP.  No appropriate beds available at [facility]. Referred out to the following hospitals:  [facility]  [facility]        Disposition CSW will continue to follow for placement.  Areatha Keas. Judi Cong, MSW, [place] Disposition Clinical Social Work [phone] (cell) [phone] (office)
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
## 2018-09-26 NOTE — ED Notes (Signed)
Entered into patient's room and patient was responding to internal stimuli. Patient was carrying on conversations thinking someone was there with him. Patient was reoriented to place and time. Patient seems more confused since beginning of shift.

## 2018-09-27 DIAGNOSIS — E876 Hypokalemia: Secondary | ICD-10-CM | POA: Diagnosis not present

## 2018-09-27 DIAGNOSIS — F419 Anxiety disorder, unspecified: Secondary | ICD-10-CM | POA: Diagnosis not present

## 2018-09-27 DIAGNOSIS — E785 Hyperlipidemia, unspecified: Secondary | ICD-10-CM | POA: Diagnosis not present

## 2018-09-27 DIAGNOSIS — K219 Gastro-esophageal reflux disease without esophagitis: Secondary | ICD-10-CM | POA: Diagnosis not present

## 2018-09-27 DIAGNOSIS — E119 Type 2 diabetes mellitus without complications: Secondary | ICD-10-CM | POA: Diagnosis not present

## 2018-09-27 DIAGNOSIS — M8949 Other hypertrophic osteoarthropathy, multiple sites: Secondary | ICD-10-CM | POA: Diagnosis not present

## 2018-09-27 DIAGNOSIS — E871 Hypo-osmolality and hyponatremia: Secondary | ICD-10-CM | POA: Diagnosis not present

## 2018-09-27 DIAGNOSIS — F323 Major depressive disorder, single episode, severe with psychotic features: Secondary | ICD-10-CM | POA: Diagnosis not present

## 2018-09-27 DIAGNOSIS — F23 Brief psychotic disorder: Secondary | ICD-10-CM | POA: Diagnosis not present

## 2018-09-27 DIAGNOSIS — G4700 Insomnia, unspecified: Secondary | ICD-10-CM | POA: Diagnosis not present

## 2018-09-27 DIAGNOSIS — I10 Essential (primary) hypertension: Secondary | ICD-10-CM | POA: Diagnosis not present

## 2018-09-27 DIAGNOSIS — G894 Chronic pain syndrome: Secondary | ICD-10-CM | POA: Diagnosis not present

## 2018-09-27 NOTE — BH Assessment (Addendum)
Re-assessment:   Patient 68 year old male present to [facility] on 09/26/2018 with complaints of hallucinations. Patient re-assessed on this date. Patient report only getting 4 or 5 hours per sleep due to racing thoughts. Pt denied history of mental health and denies family history of mental health. Pt denies history of hallucinations and report he only experienced hallucinations when his oxygen level dropped, the 1st part of February 2020. Patient stated, "Superbowl Sunday is when I realized I was seeing things." Patient expressed he also experienced hallucinations about 1 or 2 weeks ago. Patient denies suicidal/homicidal ideations. Patient not able to contract for safety   Per patient's wife patient was continuing to act bizarrely.      Case Staffed with Marvia Pickles, NP, patient continues to meet criteria for inpatient treatment

## 2018-09-27 NOTE — Progress Notes (Addendum)
Pt accepted to  [facility] Lenward Chancellor, MD is the accepting/attending provider.  Call report to [phone] [email or web address] notified.   Pt is IVC  Pt may be transported by [employer] Pt scheduled  to arrive at [facility] as soon as transport can be arranged.  Areatha Keas. Judi Cong, MSW, [place] [facility] Clinical Social Work [phone] (cell) [phone] (office) .

## 2018-09-27 NOTE — ED Provider Notes (Signed)
Patient accepted by [facility] behavioral health.  Transfer paperwork completed.   Fredia Sorrow, MD 09/27/18 [phone]

## 2018-09-27 NOTE — ED Notes (Signed)
Pt provided hospital bed, to assist Pt in getting more rest throughout the night.

## 2018-09-28 DIAGNOSIS — M159 Polyosteoarthritis, unspecified: Secondary | ICD-10-CM | POA: Insufficient documentation

## 2018-09-28 DIAGNOSIS — E871 Hypo-osmolality and hyponatremia: Secondary | ICD-10-CM | POA: Insufficient documentation

## 2018-09-28 DIAGNOSIS — K219 Gastro-esophageal reflux disease without esophagitis: Secondary | ICD-10-CM | POA: Insufficient documentation

## 2018-09-28 DIAGNOSIS — M8949 Other hypertrophic osteoarthropathy, multiple sites: Secondary | ICD-10-CM | POA: Insufficient documentation

## 2018-09-28 LAB — LIPID PANEL
Cholesterol: 158 (ref 0–200)
HDL: 37 (ref 35–70)
LDL Cholesterol: 103
Triglycerides: 88 (ref 40–160)

## 2018-09-28 LAB — CBC AND DIFFERENTIAL
HCT: 39 — AB (ref 41–53)
Hemoglobin: 12.8 — AB (ref 13.5–17.5)
Neutrophils Absolute: 7
WBC: 9.6

## 2018-09-28 LAB — TSH: TSH: 1.09 (ref 0.41–5.90)

## 2018-10-02 LAB — BASIC METABOLIC PANEL
BUN: 17 (ref 4–21)
Creatinine: 1.1 (ref 0.6–1.3)
Glucose: 171
Potassium: 4.1 (ref 3.4–5.3)
Sodium: 132 — AB (ref 137–147)

## 2018-10-03 ENCOUNTER — Other Ambulatory Visit: Payer: Self-pay | Admitting: Family Medicine

## 2018-10-04 ENCOUNTER — Ambulatory Visit: Payer: PPO | Admitting: Family Medicine

## 2018-10-04 NOTE — Telephone Encounter (Signed)
RF request for clonazepam. Last OV 08/26/2018 Next OV 10/11/2018 Last RX 04/09/2018 # 180 x 1 rf.  Please advise

## 2018-10-07 ENCOUNTER — Encounter: Payer: Self-pay | Admitting: Family Medicine

## 2018-10-08 ENCOUNTER — Encounter: Payer: Self-pay | Admitting: Family Medicine

## 2018-10-10 ENCOUNTER — Encounter: Payer: Self-pay | Admitting: Family Medicine

## 2018-10-11 ENCOUNTER — Other Ambulatory Visit: Payer: Self-pay

## 2018-10-11 ENCOUNTER — Telehealth: Payer: Self-pay | Admitting: Family Medicine

## 2018-10-11 ENCOUNTER — Ambulatory Visit (INDEPENDENT_AMBULATORY_CARE_PROVIDER_SITE_OTHER): Payer: PPO | Admitting: Family Medicine

## 2018-10-11 ENCOUNTER — Encounter: Payer: Self-pay | Admitting: Family Medicine

## 2018-10-11 VITALS — BP 114/75 | HR 96 | Temp 98.0°F | Resp 16 | Ht 69.0 in | Wt 264.2 lb

## 2018-10-11 DIAGNOSIS — F323 Major depressive disorder, single episode, severe with psychotic features: Secondary | ICD-10-CM

## 2018-10-11 DIAGNOSIS — F411 Generalized anxiety disorder: Secondary | ICD-10-CM

## 2018-10-11 DIAGNOSIS — F430 Acute stress reaction: Secondary | ICD-10-CM | POA: Diagnosis not present

## 2018-10-11 MED ORDER — DULOXETINE HCL 30 MG PO CPEP: 30.0000 mg | ORAL_CAPSULE | Freq: Every day | ORAL | 1 refills | Status: DC

## 2018-10-11 MED ORDER — METOPROLOL SUCCINATE ER 50 MG PO TB24: 50.0000 mg | ORAL_TABLET | Freq: Every day | ORAL | 1 refills | Status: DC
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
## 2018-10-11 NOTE — Telephone Encounter (Signed)
Pt was given (90,0) on 08/26/18 for both medications and note to pharmacy stating " fill on/after 11/12/18". Unsure if patient was supposed to have additional refills.  Please advise, thanks.

## 2018-10-11 NOTE — Telephone Encounter (Signed)
Pharmacy advised and patient advised.

## 2018-10-11 NOTE — Telephone Encounter (Signed)
Pt called to confirm that his rx for:  morphine (MS CONTIN) 30 MG 12 hr tablet and Oxycodone HCl 10 MG TABS would be sent in to:    [place], [place] (Phone) [phone] (Fax)  Following his OV with Dr Anitra Lauth today.  Pt states he is leaving to go out of town tomorrow and would like to pick them up before he leaves.

## 2018-10-11 NOTE — Telephone Encounter (Signed)
He should have a script for each of these meds at the pharmacy stating "fill on or after 10/13/18". It is ok to fill these early-->pls let pharmacy know.-thx

## 2018-11-08 ENCOUNTER — Ambulatory Visit: Payer: PPO | Admitting: Family Medicine

## 2018-11-11 ENCOUNTER — Other Ambulatory Visit: Payer: Self-pay

## 2018-11-11 ENCOUNTER — Encounter: Payer: Self-pay | Admitting: Family Medicine

## 2018-11-11 ENCOUNTER — Ambulatory Visit (INDEPENDENT_AMBULATORY_CARE_PROVIDER_SITE_OTHER): Payer: PPO | Admitting: Family Medicine

## 2018-11-11 VITALS — BP 121/78 | HR 84 | Temp 98.3°F

## 2018-11-11 DIAGNOSIS — M170 Bilateral primary osteoarthritis of knee: Secondary | ICD-10-CM

## 2018-11-11 DIAGNOSIS — G894 Chronic pain syndrome: Secondary | ICD-10-CM | POA: Diagnosis not present

## 2018-11-11 DIAGNOSIS — F3341 Major depressive disorder, recurrent, in partial remission: Secondary | ICD-10-CM

## 2018-11-11 DIAGNOSIS — F411 Generalized anxiety disorder: Secondary | ICD-10-CM | POA: Diagnosis not present

## 2018-11-11 DIAGNOSIS — M545 Low back pain, unspecified: Secondary | ICD-10-CM

## 2018-11-11 DIAGNOSIS — G8929 Other chronic pain: Secondary | ICD-10-CM
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
## 2018-11-29 ENCOUNTER — Other Ambulatory Visit: Payer: Self-pay | Admitting: Family Medicine

## 2018-12-11 ENCOUNTER — Other Ambulatory Visit: Payer: Self-pay | Admitting: Family Medicine

## 2018-12-11 DIAGNOSIS — I10 Essential (primary) hypertension: Secondary | ICD-10-CM

## 2018-12-11 DIAGNOSIS — G894 Chronic pain syndrome: Secondary | ICD-10-CM

## 2018-12-11 MED ORDER — OXYCODONE HCL 10 MG PO TABS: ORAL_TABLET | ORAL | 0 refills | Status: DC

## 2018-12-11 MED ORDER — MORPHINE SULFATE ER 30 MG PO TBCR: 30.0000 mg | EXTENDED_RELEASE_TABLET | Freq: Three times a day (TID) | ORAL | 0 refills | Status: DC

## 2018-12-11 NOTE — Telephone Encounter (Signed)
Called patients pharm and asked them when patient filled medication. Patient filled both medications on 11/10/2018. Patient has an appointment on 02/10/2019. We will fill medication at that appointment. Tried calling patient and lvm to call me back.
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
## 2018-12-11 NOTE — Telephone Encounter (Signed)
OK, I eRx'd morphine and oxycodone for this month and next month-->to get him to his next f/u appt with me.

## 2018-12-11 NOTE — Telephone Encounter (Signed)
morphine (MS CONTIN) 30 MG 12 hr tablet [id number]  Oxycodone HCl 10 MG TABS [id number]   [place], [place]

## 2018-12-12 NOTE — Telephone Encounter (Signed)
Called patient and he is aware. 

## 2019-01-03 ENCOUNTER — Other Ambulatory Visit: Payer: Self-pay | Admitting: Family Medicine

## 2019-01-03 NOTE — Telephone Encounter (Signed)
RF request for Cymbalta LOV: 11/11/2018 Next ov: 02/10/2019 Last written: 10/11/2018 #30 x 1 rf  Please advise, thank you

## 2019-01-06 ENCOUNTER — Telehealth: Payer: Self-pay | Admitting: Family Medicine

## 2019-01-06 NOTE — Telephone Encounter (Signed)
LM for pt to return call. Rx for Cymbalta sent 01/03/19 (90,1) to [facility]. Pt sig for Clonazepam currently has pt taking bid prn. Last refill, 10/04/18 (180,1)

## 2019-01-06 NOTE — Telephone Encounter (Signed)
Spoke with patient and advised it would be best to discuss tapering clonazepam with PCP and dosage for cymbalta. Notified that refill for cymbalta was sent on 01/03/19.

## 2019-01-06 NOTE — Telephone Encounter (Signed)
Patient is requesting call back. Patient is requesting Cymbalta refill [facility]. Patient mentioned he is not taking the restoril. Patient would like to taper ClonazePAM.

## 2019-02-10 ENCOUNTER — Encounter: Payer: Self-pay | Admitting: Family Medicine

## 2019-02-10 ENCOUNTER — Other Ambulatory Visit: Payer: Self-pay

## 2019-02-10 ENCOUNTER — Ambulatory Visit (INDEPENDENT_AMBULATORY_CARE_PROVIDER_SITE_OTHER): Payer: PPO | Admitting: Family Medicine

## 2019-02-10 VITALS — BP 124/78 | Temp 98.1°F

## 2019-02-10 DIAGNOSIS — G894 Chronic pain syndrome: Secondary | ICD-10-CM

## 2019-02-10 DIAGNOSIS — N182 Chronic kidney disease, stage 2 (mild): Secondary | ICD-10-CM

## 2019-02-10 DIAGNOSIS — I10 Essential (primary) hypertension: Secondary | ICD-10-CM | POA: Diagnosis not present

## 2019-02-10 DIAGNOSIS — E119 Type 2 diabetes mellitus without complications: Secondary | ICD-10-CM | POA: Diagnosis not present

## 2019-02-10 MED ORDER — MORPHINE SULFATE ER 30 MG PO TBCR: 30.0000 mg | EXTENDED_RELEASE_TABLET | Freq: Three times a day (TID) | ORAL | 0 refills | Status: DC

## 2019-02-10 MED ORDER — OXYCODONE HCL 10 MG PO TABS: ORAL_TABLET | ORAL | 0 refills | Status: DC
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
## 2019-02-24 ENCOUNTER — Other Ambulatory Visit: Payer: Self-pay | Admitting: Family Medicine

## 2019-03-28 DIAGNOSIS — M720 Palmar fascial fibromatosis [Dupuytren]: Secondary | ICD-10-CM

## 2019-03-28 HISTORY — DX: Palmar fascial fibromatosis (dupuytren): M72.0

## 2019-03-31 ENCOUNTER — Other Ambulatory Visit: Payer: Self-pay | Admitting: Family Medicine

## 2019-04-02 NOTE — Telephone Encounter (Signed)
RF request for magic mouthwash, unsure if this is an appropriate rf for patient.  Please advise.

## 2019-04-03 ENCOUNTER — Telehealth: Payer: Self-pay

## 2019-04-03 MED ORDER — CLONAZEPAM 2 MG PO TABS: 2.0000 mg | ORAL_TABLET | Freq: Two times a day (BID) | ORAL | 1 refills | Status: DC | PRN

## 2019-04-03 NOTE — Telephone Encounter (Signed)
Patient aware of RX's at pharmacy.

## 2019-04-03 NOTE — Telephone Encounter (Signed)
OK klonopin eRX'd

## 2019-04-03 NOTE — Telephone Encounter (Signed)
Patient requesting clonazePAM (KLONOPIN) 2 MG tablet & DULoxetine (CYMBALTA) 30 MG capsule sent to [facility]

## 2019-04-03 NOTE — Telephone Encounter (Signed)
Pt should have refill left of Cymbalta at pharmacy.   RF request for Klonopin 2mg  tablet LOV: 02/10/2019 Next ov: 04/11/2019 Last written: 10/04/2018 #180 x1 refill   Please advise on Klonopin. Will let patient know he should have refill of Cymbalta at his pharmacy.  Opioid Treatment Agreement signed (Y/N):08/26/18. Opioid Treatment Agreement last reviewed with patient:08/26/2018 NCCSRS reviewed this encounter (include red flags):yes, no red flags, 08/26/2018

## 2019-04-09 ENCOUNTER — Other Ambulatory Visit: Payer: Self-pay

## 2019-04-11 ENCOUNTER — Encounter: Payer: Self-pay | Admitting: Family Medicine

## 2019-04-11 ENCOUNTER — Other Ambulatory Visit: Payer: Self-pay

## 2019-04-11 ENCOUNTER — Ambulatory Visit (INDEPENDENT_AMBULATORY_CARE_PROVIDER_SITE_OTHER): Payer: PPO | Admitting: Family Medicine

## 2019-04-11 VITALS — BP 133/83 | HR 78

## 2019-04-11 DIAGNOSIS — G894 Chronic pain syndrome: Secondary | ICD-10-CM | POA: Diagnosis not present

## 2019-04-11 DIAGNOSIS — E119 Type 2 diabetes mellitus without complications: Secondary | ICD-10-CM

## 2019-04-11 DIAGNOSIS — I10 Essential (primary) hypertension: Secondary | ICD-10-CM

## 2019-04-11 MED ORDER — OXYCODONE HCL 10 MG PO TABS: ORAL_TABLET | ORAL | 0 refills | Status: DC

## 2019-04-11 MED ORDER — MORPHINE SULFATE ER 30 MG PO TBCR: 30.0000 mg | EXTENDED_RELEASE_TABLET | Freq: Three times a day (TID) | ORAL | 0 refills | Status: DC

## 2019-04-11 MED ORDER — BLOOD GLUCOSE MONITOR KIT: PACK | 0 refills | Status: DC
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
## 2019-07-01 ENCOUNTER — Other Ambulatory Visit: Payer: Self-pay | Admitting: Family Medicine

## 2019-07-09 ENCOUNTER — Other Ambulatory Visit: Payer: Self-pay

## 2019-07-10 ENCOUNTER — Other Ambulatory Visit: Payer: Self-pay | Admitting: Family Medicine

## 2019-07-10 ENCOUNTER — Other Ambulatory Visit: Payer: Self-pay

## 2019-07-10 ENCOUNTER — Encounter: Payer: PPO | Admitting: Family Medicine

## 2019-07-10 DIAGNOSIS — I10 Essential (primary) hypertension: Secondary | ICD-10-CM

## 2019-07-10 DIAGNOSIS — G894 Chronic pain syndrome: Secondary | ICD-10-CM

## 2019-07-10 NOTE — Telephone Encounter (Signed)
Requesting: oxycodone, morphine Contract:08/26/18 UDS:12/19/17 Last Visit:04/11/19 Next Visit:07/16/19 Last Refill:04/11/19(90,0)  Please Advise. Medications pending

## 2019-07-10 NOTE — Telephone Encounter (Signed)
Meds already pending PCP approval via telephone message.

## 2019-07-10 NOTE — Telephone Encounter (Signed)
Patient requesting refill to bridge over till visit   Oxycodone HCl 10 MG TABS  morphine (MS CONTIN) 30 MG 12 hr tablet   [facility] [place] [facility] [address]   Please advise
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
## 2019-07-11 ENCOUNTER — Encounter: Payer: PPO | Admitting: Family Medicine

## 2019-07-11 NOTE — Telephone Encounter (Signed)
Phone note already created for this patient for refills. Pt was late to scheduled appt and had to be rescheduled.

## 2019-07-11 NOTE — Telephone Encounter (Signed)
Patient calling back today inquiring about refill request for Oxycodone and Morphine.  Patient is aware that Dr. Anitra Lauth is not in office today. Patient stated he has been taking this medication for over 9 years and he will be very sick without them.

## 2019-07-12 MED ORDER — OXYCODONE HCL 10 MG PO TABS: ORAL_TABLET | ORAL | 0 refills | Status: DC

## 2019-07-12 MED ORDER — MORPHINE SULFATE ER 30 MG PO TBCR: 30.0000 mg | EXTENDED_RELEASE_TABLET | Freq: Three times a day (TID) | ORAL | 0 refills | Status: DC

## 2019-07-14 NOTE — Telephone Encounter (Signed)
I already did rx's for each of these meds 2 d/a.  Did they not go through?

## 2019-07-14 NOTE — Telephone Encounter (Signed)
Refills sent in for pt  

## 2019-07-14 NOTE — Telephone Encounter (Signed)
Refills sent 4/17. Unable to refuse refills for these medications.

## 2019-07-14 NOTE — Telephone Encounter (Signed)
Refills sent

## 2019-07-15 ENCOUNTER — Other Ambulatory Visit: Payer: Self-pay

## 2019-07-15 NOTE — Telephone Encounter (Signed)
The RX's went through but they were requested multiple times and for some reason we "don't have authority" to even refuse these.  Please just hit refuse to this duplicate request.  Thank you!

## 2019-07-16 ENCOUNTER — Encounter: Payer: Self-pay | Admitting: Family Medicine

## 2019-07-16 ENCOUNTER — Ambulatory Visit (INDEPENDENT_AMBULATORY_CARE_PROVIDER_SITE_OTHER): Payer: PPO | Admitting: Family Medicine

## 2019-07-16 VITALS — BP 104/76 | HR 110 | Temp 97.6°F | Resp 16 | Ht 68.0 in | Wt 260.4 lb

## 2019-07-16 DIAGNOSIS — Z125 Encounter for screening for malignant neoplasm of prostate: Secondary | ICD-10-CM

## 2019-07-16 DIAGNOSIS — Z0000 Encounter for general adult medical examination without abnormal findings: Secondary | ICD-10-CM

## 2019-07-16 DIAGNOSIS — Z79899 Other long term (current) drug therapy: Secondary | ICD-10-CM

## 2019-07-16 DIAGNOSIS — G894 Chronic pain syndrome: Secondary | ICD-10-CM

## 2019-07-16 DIAGNOSIS — E119 Type 2 diabetes mellitus without complications: Secondary | ICD-10-CM | POA: Diagnosis not present

## 2019-07-16 DIAGNOSIS — I10 Essential (primary) hypertension: Secondary | ICD-10-CM

## 2019-07-16 MED ORDER — MORPHINE SULFATE ER 30 MG PO TBCR: 30.0000 mg | EXTENDED_RELEASE_TABLET | Freq: Three times a day (TID) | ORAL | 0 refills | Status: DC

## 2019-07-16 MED ORDER — OXYCODONE HCL 10 MG PO TABS: ORAL_TABLET | ORAL | 0 refills | Status: DC

## 2019-07-16 NOTE — Patient Instructions (Signed)
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
## 2019-07-17 ENCOUNTER — Encounter: Payer: Self-pay | Admitting: Family Medicine

## 2019-07-17 LAB — CBC WITH DIFFERENTIAL/PLATELET
Basophils Absolute: 0.1 10*3/uL (ref 0.0–0.1)
Basophils Relative: 0.8 % (ref 0.0–3.0)
Eosinophils Absolute: 0.1 10*3/uL (ref 0.0–0.7)
Eosinophils Relative: 1.5 % (ref 0.0–5.0)
HCT: 45 % (ref 39.0–52.0)
Hemoglobin: 15.5 g/dL (ref 13.0–17.0)
Lymphocytes Relative: 17.5 % (ref 12.0–46.0)
Lymphs Abs: 1.6 10*3/uL (ref 0.7–4.0)
MCHC: 34.5 g/dL (ref 30.0–36.0)
MCV: 95.7 fl (ref 78.0–100.0)
Monocytes Absolute: 0.8 10*3/uL (ref 0.1–1.0)
Monocytes Relative: 8.8 % (ref 3.0–12.0)
Neutro Abs: 6.6 10*3/uL (ref 1.4–7.7)
Neutrophils Relative %: 71.4 % (ref 43.0–77.0)
Platelets: 245 10*3/uL (ref 150.0–400.0)
RBC: 4.7 Mil/uL (ref 4.22–5.81)
RDW: 13.2 % (ref 11.5–15.5)
WBC: 9.2 10*3/uL (ref 4.0–10.5)

## 2019-07-17 LAB — COMPREHENSIVE METABOLIC PANEL
ALT: 17 U/L (ref 0–53)
AST: 16 U/L (ref 0–37)
Albumin: 4.3 g/dL (ref 3.5–5.2)
Alkaline Phosphatase: 65 U/L (ref 39–117)
BUN: 12 mg/dL (ref 6–23)
CO2: 31 mEq/L (ref 19–32)
Calcium: 9.6 mg/dL (ref 8.4–10.5)
Chloride: 93 mEq/L — ABNORMAL LOW (ref 96–112)
Creatinine, Ser: 1.08 mg/dL (ref 0.40–1.50)
GFR: 68.25 mL/min (ref >60.00)
Glucose, Bld: 110 mg/dL — ABNORMAL HIGH (ref 70–99)
Potassium: 4 mEq/L (ref 3.5–5.1)
Sodium: 134 mEq/L — ABNORMAL LOW (ref 135–145)
Total Bilirubin: 0.8 mg/dL (ref 0.2–1.2)
Total Protein: 7.4 g/dL (ref 6.0–8.3)

## 2019-07-17 LAB — HEMOGLOBIN A1C: Hgb A1c MFr Bld: 6 % (ref 4.6–6.5)

## 2019-07-17 LAB — LIPID PANEL
Cholesterol: 240 mg/dL — ABNORMAL HIGH (ref 0–200)
HDL: 44.2 mg/dL (ref >39.00)
LDL Cholesterol: 162 mg/dL — ABNORMAL HIGH (ref 0–99)
NonHDL: 195.9
Total CHOL/HDL Ratio: 5
Triglycerides: 168 mg/dL — ABNORMAL HIGH (ref 0.0–149.0)
VLDL: 33.6 mg/dL (ref 0.0–40.0)

## 2019-07-17 LAB — PSA, MEDICARE: PSA: 0.52 ng/ml (ref 0.10–4.00)

## 2019-07-18 ENCOUNTER — Telehealth: Payer: Self-pay

## 2019-07-18 NOTE — Telephone Encounter (Signed)
Patient called in wanting Dr to give him a call at his earliest convince

## 2019-07-18 NOTE — Telephone Encounter (Signed)
Sent as FYI.  Patient contacted and wanted to extend his vacation home for a week's stay at your convenience. He stated" you should be able to relax with your family and take a break".

## 2019-07-18 NOTE — Telephone Encounter (Signed)
Noted. This is very nice of him.

## 2019-07-19 LAB — PAIN MGMT, PROFILE 8 W/CONF, U
6 Acetylmorphine: NEGATIVE ng/mL
Alcohol Metabolites: NEGATIVE ng/mL (ref <500)
Alphahydroxyalprazolam: NEGATIVE ng/mL
Alphahydroxymidazolam: NEGATIVE ng/mL
Alphahydroxytriazolam: NEGATIVE ng/mL
Aminoclonazepam: 1029 ng/mL
Amphetamines: NEGATIVE ng/mL
Benzodiazepines: POSITIVE ng/mL
Buprenorphine, Urine: NEGATIVE ng/mL
Cocaine Metabolite: NEGATIVE ng/mL
Codeine: NEGATIVE ng/mL
Creatinine: 87.1 mg/dL
Hydrocodone: NEGATIVE ng/mL
Hydromorphone: NEGATIVE ng/mL
Hydroxyethylflurazepam: NEGATIVE ng/mL
Lorazepam: NEGATIVE ng/mL
MDMA: NEGATIVE ng/mL
Marijuana Metabolite: NEGATIVE ng/mL
Morphine: 5423 ng/mL
Nordiazepam: NEGATIVE ng/mL
Norhydrocodone: NEGATIVE ng/mL
Noroxycodone: 1049 ng/mL
Opiates: POSITIVE ng/mL
Oxazepam: NEGATIVE ng/mL
Oxidant: NEGATIVE ug/mL
Oxycodone: 161 ng/mL
Oxycodone: POSITIVE ng/mL
Oxymorphone: 68 ng/mL
Temazepam: NEGATIVE ng/mL
pH: 7.5 (ref 4.5–9.0)

## 2019-08-29 ENCOUNTER — Other Ambulatory Visit: Payer: Self-pay | Admitting: Family Medicine

## 2019-09-10 ENCOUNTER — Other Ambulatory Visit: Payer: Self-pay | Admitting: Family Medicine

## 2019-09-10 DIAGNOSIS — I10 Essential (primary) hypertension: Secondary | ICD-10-CM

## 2019-09-10 DIAGNOSIS — G894 Chronic pain syndrome: Secondary | ICD-10-CM

## 2019-09-12 NOTE — Telephone Encounter (Signed)
Pt should have rx's for morphine and oxycodone on file at the pharmacy ("fill on/after 09/09/19"). I'm going to decline these current RF requests.

## 2019-09-12 NOTE — Telephone Encounter (Signed)
Patient received mychart message regarding refills for meds being available at the pharmacy. I do not have security to deny certain controlled medications. Please advise.

## 2019-09-25 ENCOUNTER — Other Ambulatory Visit: Payer: Self-pay | Admitting: Family Medicine

## 2019-09-25 NOTE — Telephone Encounter (Signed)
Requesting: clonazepam Contract:08/26/18 UDS:07/16/19 Last Visit:07/16/19 Next Visit: advised to f/u 3 mo. Last Refill:04/03/19(180,1)  Please Advise. Medication pending

## 2019-10-09 ENCOUNTER — Other Ambulatory Visit: Payer: Self-pay | Admitting: Family Medicine

## 2019-10-09 DIAGNOSIS — G894 Chronic pain syndrome: Secondary | ICD-10-CM

## 2019-10-09 DIAGNOSIS — I10 Essential (primary) hypertension: Secondary | ICD-10-CM

## 2019-10-09 NOTE — Telephone Encounter (Signed)
Patient refill request - states he is out of med, but states it was filled on 6/4 90 d/s  DULoxetine (CYMBALTA) 30 MG capsule [[id number]]   [place],  - [place]   Please call patient [phone].

## 2019-10-10 NOTE — Telephone Encounter (Signed)
Requesting: morphine Contract:08/26/18 UDS:07/16/19 Last Visit:07/16/19 Next Visit: advised to fu 3 months Last Refill:09/10/19(90,0)  Requesting: oxycodone Contract:08/26/18 UDS:07/16/19 Last Visit:07/16/19 Next Visit:advised to f/u 3 months Last Refill:09/10/19(90,0)  Please Advise, medications pending. Verified with pharmacy no refills on file. Last p/u was 09/10/19 for 30 day supply

## 2019-10-10 NOTE — Telephone Encounter (Signed)
Patient refill request   morphine (MS CONTIN) 30 MG 12 hr tablet [[id number]]  Oxycodone HCl 10 MG TABS [[id number]]   [facility] - [place], [place] [address]  [place], [place] [address]  Phone:  [phone] Fax:  [phone]  DEA #:  --  Patient can be reached at [phone]

## 2019-10-10 NOTE — Telephone Encounter (Signed)
Will send in 1 mo supply of pain meds but he needs f/u with me prior to any further rx's for this.-thx

## 2019-10-10 NOTE — Telephone Encounter (Signed)
Pt calling because he has the cymbalta but his two CII medications. He is out now as his last does was yesterday.

## 2019-10-13 NOTE — Telephone Encounter (Signed)
Patient has been scheduled for f/u appt.

## 2019-10-24 ENCOUNTER — Encounter: Payer: Self-pay | Admitting: Family Medicine

## 2019-10-27 ENCOUNTER — Other Ambulatory Visit: Payer: Self-pay

## 2019-10-27 ENCOUNTER — Encounter: Payer: Self-pay | Admitting: Family Medicine

## 2019-10-27 ENCOUNTER — Ambulatory Visit (INDEPENDENT_AMBULATORY_CARE_PROVIDER_SITE_OTHER): Payer: PPO | Admitting: Family Medicine

## 2019-10-27 VITALS — BP 130/81 | HR 80 | Temp 98.3°F | Resp 16 | Ht 68.0 in | Wt 273.6 lb

## 2019-10-27 DIAGNOSIS — G8929 Other chronic pain: Secondary | ICD-10-CM

## 2019-10-27 DIAGNOSIS — M170 Bilateral primary osteoarthritis of knee: Secondary | ICD-10-CM

## 2019-10-27 DIAGNOSIS — N1831 Chronic kidney disease, stage 3a: Secondary | ICD-10-CM

## 2019-10-27 DIAGNOSIS — I10 Essential (primary) hypertension: Secondary | ICD-10-CM | POA: Diagnosis not present

## 2019-10-27 DIAGNOSIS — E119 Type 2 diabetes mellitus without complications: Secondary | ICD-10-CM

## 2019-10-27 DIAGNOSIS — M25562 Pain in left knee: Secondary | ICD-10-CM

## 2019-10-27 DIAGNOSIS — M25561 Pain in right knee: Secondary | ICD-10-CM

## 2019-10-27 DIAGNOSIS — G894 Chronic pain syndrome: Secondary | ICD-10-CM | POA: Diagnosis not present

## 2019-10-27 LAB — BASIC METABOLIC PANEL
BUN: 13 mg/dL (ref 6–23)
CO2: 34 mEq/L — ABNORMAL HIGH (ref 19–32)
Calcium: 9.4 mg/dL (ref 8.4–10.5)
Chloride: 94 mEq/L — ABNORMAL LOW (ref 96–112)
Creatinine, Ser: 1.03 mg/dL (ref 0.40–1.50)
GFR: 72.03 mL/min (ref >60.00)
Glucose, Bld: 110 mg/dL — ABNORMAL HIGH (ref 70–99)
Potassium: 3.9 mEq/L (ref 3.5–5.1)
Sodium: 136 mEq/L (ref 135–145)

## 2019-10-27 LAB — HEMOGLOBIN A1C: Hgb A1c MFr Bld: 6.2 % (ref 4.6–6.5)

## 2019-10-27 MED ORDER — MORPHINE SULFATE ER 30 MG PO TBCR: EXTENDED_RELEASE_TABLET | ORAL | 0 refills | Status: DC

## 2019-10-27 MED ORDER — OXYCODONE HCL 10 MG PO TABS: ORAL_TABLET | ORAL | 0 refills | Status: DC
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
## 2019-11-11 DIAGNOSIS — M720 Palmar fascial fibromatosis [Dupuytren]: Secondary | ICD-10-CM | POA: Diagnosis not present

## 2019-11-11 DIAGNOSIS — M79641 Pain in right hand: Secondary | ICD-10-CM | POA: Diagnosis not present

## 2019-11-21 ENCOUNTER — Other Ambulatory Visit: Payer: Self-pay | Admitting: Family Medicine

## 2019-11-21 NOTE — Telephone Encounter (Signed)
Rx sent # 10

## 2019-11-21 NOTE — Telephone Encounter (Signed)
Patient dropped medication, and wasn't able to get them all up and is #10 short.  He spoke to someone in the pharmacy and they told him he would have to pay cash price and Dr. Anitra Lauth would need to write a prescription for #10 pills (no refills left on current prescription)  hydrochlorothiazide (HYDRODIURIL) 25 MG tablet [[id number]   [place], [place] - [place]

## 2019-11-26 HISTORY — PX: DUPUYTREN CONTRACTURE RELEASE: SHX1478

## 2019-11-28 ENCOUNTER — Other Ambulatory Visit: Payer: Self-pay | Admitting: Family Medicine

## 2019-11-28 MED ORDER — HYDROCHLOROTHIAZIDE 25 MG PO TABS: 25.0000 mg | ORAL_TABLET | Freq: Every day | ORAL | 0 refills | Status: DC

## 2019-12-17 DIAGNOSIS — E119 Type 2 diabetes mellitus without complications: Secondary | ICD-10-CM | POA: Diagnosis not present

## 2019-12-17 LAB — HM DIABETES EYE EXAM

## 2019-12-18 ENCOUNTER — Encounter: Payer: Self-pay | Admitting: Family Medicine

## 2019-12-18 NOTE — Telephone Encounter (Signed)
FYI  Please see below

## 2019-12-23 ENCOUNTER — Encounter: Payer: Self-pay | Admitting: Family Medicine

## 2019-12-24 MED ORDER — CLONAZEPAM 1 MG PO TABS
1.0000 mg | ORAL_TABLET | Freq: Two times a day (BID) | ORAL | 1 refills | Status: DC | PRN
Start: 2019-12-24 — End: 2020-04-22

## 2019-12-24 NOTE — Telephone Encounter (Signed)
Patient was last seen 10/27/19 for f/u pain,dm, and htn and advised to f/u 3 months. Last refill for oxycodone was 10/27/19 #90 with 0 refills. Would like to know if ok for new Rx decreased to 5mg  instead.  Please advise, thanks.

## 2019-12-24 NOTE — Telephone Encounter (Signed)
OK, clonaz 1mg  eRx'd

## 2019-12-24 NOTE — Telephone Encounter (Signed)
Patient is referring to clonazepam 2mg  Rx. Last Rx completed 09/25/19 #180 with 0 refills. Due for next refill. Next appt scheduled for 01/07/20.  Please advise, thanks.

## 2019-12-24 NOTE — Telephone Encounter (Signed)
He has a 10mg  oxycodone rx on hold at his pharmacy. Does he want me to cancel that and start the 5mg  oxycodone now or does he want to take this last month of the 10mg  and start the 5mg  at the time of next f/u in 1 mo?

## 2019-12-26 NOTE — Telephone Encounter (Signed)
Spoke with patient to notify refill available for clonazepam at requested dose.

## 2019-12-29 DIAGNOSIS — M720 Palmar fascial fibromatosis [Dupuytren]: Secondary | ICD-10-CM | POA: Diagnosis not present

## 2019-12-31 DIAGNOSIS — M720 Palmar fascial fibromatosis [Dupuytren]: Secondary | ICD-10-CM | POA: Diagnosis not present

## 2019-12-31 DIAGNOSIS — M25641 Stiffness of right hand, not elsewhere classified: Secondary | ICD-10-CM | POA: Diagnosis not present

## 2019-12-31 HISTORY — PX: HAND SURGERY: SHX662

## 2020-01-01 ENCOUNTER — Telehealth: Payer: Self-pay

## 2020-01-01 ENCOUNTER — Other Ambulatory Visit: Payer: Self-pay

## 2020-01-01 NOTE — Telephone Encounter (Signed)
Patient had hand surgery on R ring finger yesterday. He was given Rx for doxycyline 100mg  bid until finished. He is still taking other pain medication prescribed by you. Any med suggestions such as tylenol or ibuprofen?

## 2020-01-01 NOTE — Telephone Encounter (Signed)
Patient requesting to speak to [facility] regarding medications and his finger.  He stated they have spoken before about this.  Please call [phone].

## 2020-01-01 NOTE — Telephone Encounter (Signed)
Tylenol 500mg , 1-2 tabs every 6 hours as needed.

## 2020-01-02 DIAGNOSIS — S61208D Unspecified open wound of other finger without damage to nail, subsequent encounter: Secondary | ICD-10-CM | POA: Diagnosis not present

## 2020-01-02 NOTE — Telephone Encounter (Signed)
Patient advised of recommendations. Nothing further needed.

## 2020-01-04 DIAGNOSIS — S61204D Unspecified open wound of right ring finger without damage to nail, subsequent encounter: Secondary | ICD-10-CM | POA: Diagnosis not present

## 2020-01-09 ENCOUNTER — Encounter: Payer: Self-pay | Admitting: Family Medicine

## 2020-01-09 ENCOUNTER — Other Ambulatory Visit: Payer: Self-pay | Admitting: Family Medicine

## 2020-01-09 ENCOUNTER — Telehealth: Payer: Self-pay

## 2020-01-09 NOTE — Telephone Encounter (Signed)
Patient was made aware refill will be available for process today.

## 2020-01-09 NOTE — Telephone Encounter (Signed)
Patient refill request  Oxycodone HCl 10 MG TABS [[id number]]    [name], [name]

## 2020-01-09 NOTE — Telephone Encounter (Signed)
Spoke with pharmacy and an error occurred on their end. Rx will be processed for refill today.

## 2020-01-12 DIAGNOSIS — T148XXA Other injury of unspecified body region, initial encounter: Secondary | ICD-10-CM | POA: Diagnosis not present

## 2020-01-12 DIAGNOSIS — M720 Palmar fascial fibromatosis [Dupuytren]: Secondary | ICD-10-CM | POA: Diagnosis not present

## 2020-01-12 NOTE — Telephone Encounter (Signed)
He should have a rx on hold at pharmacy which states "fill on/after 01/07/20". He needs to call his pharmacy to get this. I'll deny this RF request.

## 2020-01-12 NOTE — Telephone Encounter (Signed)
Requesting: oxycodone Contract:10/31/19 UDS:07/16/19 Last Visit:10/27/19 Next Visit:n/a Last Refill:10/27/19  Please Advise

## 2020-01-13 NOTE — Telephone Encounter (Signed)
Refill addressed on 10/16, not needed at this time. Please deny current refill request. Medication pending

## 2020-01-17 ENCOUNTER — Encounter: Payer: Self-pay | Admitting: Family Medicine

## 2020-02-09 ENCOUNTER — Encounter: Payer: Self-pay | Admitting: Family Medicine

## 2020-02-09 ENCOUNTER — Other Ambulatory Visit: Payer: Self-pay | Admitting: Family Medicine

## 2020-02-09 ENCOUNTER — Other Ambulatory Visit: Payer: Self-pay

## 2020-02-09 ENCOUNTER — Telehealth: Payer: Self-pay | Admitting: Family Medicine

## 2020-02-09 DIAGNOSIS — G894 Chronic pain syndrome: Secondary | ICD-10-CM

## 2020-02-09 DIAGNOSIS — I10 Essential (primary) hypertension: Secondary | ICD-10-CM

## 2020-02-09 MED ORDER — OXYCODONE HCL 10 MG PO TABS
ORAL_TABLET | ORAL | 0 refills | Status: DC
Start: 2020-02-09 — End: 2020-02-09

## 2020-02-09 MED ORDER — OXYCODONE HCL 10 MG PO TABS: ORAL_TABLET | ORAL | 0 refills | Status: DC

## 2020-02-09 MED ORDER — MORPHINE SULFATE ER 30 MG PO TBCR: EXTENDED_RELEASE_TABLET | ORAL | 0 refills | Status: DC

## 2020-02-09 NOTE — Telephone Encounter (Signed)
FYI

## 2020-02-09 NOTE — Telephone Encounter (Signed)
Patient called to check status of morphine and oxycodone refills, per the mychart messages he sent this morning.

## 2020-02-09 NOTE — Telephone Encounter (Signed)
Will do #90 of each but he must have in-person f/u for pain in the next 1 mo.

## 2020-02-09 NOTE — Telephone Encounter (Signed)
Requesting: oxycodone and morphine Contract:10/27/19 UDS:07/16/19 Last Visit:10/27/19 Next Visit:n/a Last Refill: 10/27/19 (90,0)  Please Advise  Medication pending

## 2020-02-10 NOTE — Telephone Encounter (Signed)
Pt scheduled for 03/02/20 f/u

## 2020-02-25 ENCOUNTER — Encounter: Payer: Self-pay | Admitting: Family Medicine

## 2020-02-25 ENCOUNTER — Other Ambulatory Visit: Payer: Self-pay | Admitting: Family Medicine

## 2020-02-25 MED ORDER — METOPROLOL SUCCINATE ER 50 MG PO TB24
50.0000 mg | ORAL_TABLET | Freq: Every day | ORAL | 0 refills | Status: DC
Start: 2020-02-25 — End: 2020-05-25

## 2020-02-25 MED ORDER — HYDROCHLOROTHIAZIDE 25 MG PO TABS
25.0000 mg | ORAL_TABLET | Freq: Every day | ORAL | 0 refills | Status: DC
Start: 2020-02-25 — End: 2020-05-25

## 2020-02-25 MED ORDER — LISINOPRIL 10 MG PO TABS
10.0000 mg | ORAL_TABLET | Freq: Every day | ORAL | 0 refills | Status: DC
Start: 2020-02-25 — End: 2020-05-25

## 2020-03-02 ENCOUNTER — Other Ambulatory Visit: Payer: Self-pay

## 2020-03-02 ENCOUNTER — Encounter: Payer: Self-pay | Admitting: Family Medicine

## 2020-03-02 ENCOUNTER — Ambulatory Visit (INDEPENDENT_AMBULATORY_CARE_PROVIDER_SITE_OTHER): Payer: PPO | Admitting: Family Medicine

## 2020-03-02 VITALS — BP 116/72 | HR 103 | Temp 97.9°F | Resp 16 | Ht 68.0 in | Wt 286.6 lb

## 2020-03-02 DIAGNOSIS — E119 Type 2 diabetes mellitus without complications: Secondary | ICD-10-CM | POA: Diagnosis not present

## 2020-03-02 DIAGNOSIS — I10 Essential (primary) hypertension: Secondary | ICD-10-CM | POA: Diagnosis not present

## 2020-03-02 DIAGNOSIS — F411 Generalized anxiety disorder: Secondary | ICD-10-CM

## 2020-03-02 DIAGNOSIS — N183 Chronic kidney disease, stage 3 unspecified: Secondary | ICD-10-CM | POA: Diagnosis not present

## 2020-03-02 DIAGNOSIS — G894 Chronic pain syndrome: Secondary | ICD-10-CM

## 2020-03-02 DIAGNOSIS — E7800 Pure hypercholesterolemia, unspecified: Secondary | ICD-10-CM | POA: Diagnosis not present

## 2020-03-02 LAB — COMPREHENSIVE METABOLIC PANEL
ALT: 27 U/L (ref 0–53)
AST: 20 U/L (ref 0–37)
Albumin: 4.1 g/dL (ref 3.5–5.2)
Alkaline Phosphatase: 53 U/L (ref 39–117)
BUN: 23 mg/dL (ref 6–23)
CO2: 33 mEq/L — ABNORMAL HIGH (ref 19–32)
Calcium: 9.5 mg/dL (ref 8.4–10.5)
Chloride: 91 mEq/L — ABNORMAL LOW (ref 96–112)
Creatinine, Ser: 1.29 mg/dL (ref 0.40–1.50)
GFR: 57.43 mL/min — ABNORMAL LOW (ref >60.00)
Glucose, Bld: 139 mg/dL — ABNORMAL HIGH (ref 70–99)
Potassium: 3.6 mEq/L (ref 3.5–5.1)
Sodium: 134 mEq/L — ABNORMAL LOW (ref 135–145)
Total Bilirubin: 0.7 mg/dL (ref 0.2–1.2)
Total Protein: 7.4 g/dL (ref 6.0–8.3)

## 2020-03-02 LAB — LIPID PANEL
Cholesterol: 200 mg/dL (ref 0–200)
HDL: 44.7 mg/dL (ref >39.00)
NonHDL: 155.04
Total CHOL/HDL Ratio: 4
Triglycerides: 219 mg/dL — ABNORMAL HIGH (ref 0.0–149.0)
VLDL: 43.8 mg/dL — ABNORMAL HIGH (ref 0.0–40.0)

## 2020-03-02 LAB — LDL CHOLESTEROL, DIRECT: Direct LDL: 133 mg/dL

## 2020-03-02 LAB — HEMOGLOBIN A1C: Hgb A1c MFr Bld: 6.6 % — ABNORMAL HIGH (ref 4.6–6.5)

## 2020-03-02 MED ORDER — SERTRALINE HCL 50 MG PO TABS: 50.0000 mg | ORAL_TABLET | Freq: Every day | ORAL | 1 refills | Status: DC
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
## 2020-03-02 NOTE — Patient Instructions (Signed)
Start taking new anxiety med that I prescribed today called sertraline---one tab per day EVERY DAY.  This med will likely take at least 3 weeks to start to help so please be patient with it!

## 2020-03-22 DIAGNOSIS — E119 Type 2 diabetes mellitus without complications: Secondary | ICD-10-CM | POA: Diagnosis not present

## 2020-03-22 DIAGNOSIS — H01005 Unspecified blepharitis left lower eyelid: Secondary | ICD-10-CM | POA: Diagnosis not present

## 2020-03-22 DIAGNOSIS — H01004 Unspecified blepharitis left upper eyelid: Secondary | ICD-10-CM | POA: Diagnosis not present

## 2020-03-22 DIAGNOSIS — H02834 Dermatochalasis of left upper eyelid: Secondary | ICD-10-CM | POA: Diagnosis not present

## 2020-03-22 DIAGNOSIS — H01002 Unspecified blepharitis right lower eyelid: Secondary | ICD-10-CM | POA: Diagnosis not present

## 2020-03-22 DIAGNOSIS — H25813 Combined forms of age-related cataract, bilateral: Secondary | ICD-10-CM | POA: Diagnosis not present

## 2020-03-22 DIAGNOSIS — D23121 Other benign neoplasm of skin of left upper eyelid, including canthus: Secondary | ICD-10-CM | POA: Diagnosis not present

## 2020-03-22 DIAGNOSIS — H02831 Dermatochalasis of right upper eyelid: Secondary | ICD-10-CM | POA: Diagnosis not present

## 2020-03-22 DIAGNOSIS — H01001 Unspecified blepharitis right upper eyelid: Secondary | ICD-10-CM | POA: Diagnosis not present

## 2020-03-30 ENCOUNTER — Encounter: Payer: Self-pay | Admitting: Family Medicine

## 2020-04-09 ENCOUNTER — Encounter: Payer: Self-pay | Admitting: Family Medicine

## 2020-04-09 ENCOUNTER — Other Ambulatory Visit: Payer: Self-pay | Admitting: Family Medicine

## 2020-04-09 DIAGNOSIS — I10 Essential (primary) hypertension: Secondary | ICD-10-CM

## 2020-04-09 DIAGNOSIS — G894 Chronic pain syndrome: Secondary | ICD-10-CM

## 2020-04-09 NOTE — Telephone Encounter (Signed)
Please refuse Rx. Pt picked up Rx today.

## 2020-04-19 ENCOUNTER — Other Ambulatory Visit: Payer: Self-pay | Admitting: Family Medicine

## 2020-04-20 NOTE — Telephone Encounter (Signed)
Klonopin last filled 01/21/20, #180. If he has not taken it in a couple months then he should have many pills left and he is fine to cut them in half and take half tab twice a day as needed.

## 2020-04-20 NOTE — Telephone Encounter (Signed)
Please call regarding medication.  Klonopin and Sertraline.  Can you break Klonopin in half?  He feels 1 mg is too much and does not like feeling.  Please call [phone].

## 2020-04-20 NOTE — Telephone Encounter (Signed)
Spoke with patient and he is doing well on sertraline. Last seen 12/7 and advised to f/u 4 weeks, appt was not scheduled but reported on 1/14 that he was not feeling well and unsure if covid related symptoms. He has not taken clonazepam in over 2 months but would like to restart this at 0.5mg  dose. Advised he should still schedule provider f/u appt to further discuss.  Medication pending for 1mg  dose since Rx refill request. Please advise, thanks.

## 2020-04-20 NOTE — Telephone Encounter (Signed)
Spoke with patient and he disposed of the pills and bottle prior a little after last refill on 01/21/20. He no longer has this prescription

## 2020-04-21 ENCOUNTER — Encounter: Payer: Self-pay | Admitting: Family Medicine

## 2020-04-22 ENCOUNTER — Other Ambulatory Visit: Payer: Self-pay

## 2020-04-22 MED ORDER — CLONAZEPAM 0.5 MG PO TABS: 0.5000 mg | ORAL_TABLET | Freq: Two times a day (BID) | ORAL | 1 refills | Status: DC | PRN

## 2020-04-22 NOTE — Telephone Encounter (Signed)
Spoke with patient and he disposed of the pills and bottle prior a little after last refill on 01/21/20. He no longer has this prescription.  Please advise, thanks. Medication pending

## 2020-04-22 NOTE — Telephone Encounter (Signed)
OK. I'm going to eRx the clonazepam 0.5mg  tabs and he can take 1 bid as needed. I denied the RF on the 1mg  clonaz tabs.

## 2020-04-22 NOTE — Telephone Encounter (Signed)
Spoke with patient and he said he received refill thru mail delivery for 1mg  tablet from [facility]. Fill date showing 04/20/20. Advised patient to just take 1/2 as previously instructed on last refill request. Voiced understanding.

## 2020-04-30 ENCOUNTER — Telehealth: Payer: Self-pay | Admitting: Family Medicine

## 2020-04-30 ENCOUNTER — Encounter: Payer: Self-pay | Admitting: Family Medicine

## 2020-04-30 MED ORDER — SERTRALINE HCL 50 MG PO TABS
50.0000 mg | ORAL_TABLET | Freq: Every day | ORAL | 0 refills | Status: DC
Start: 2020-04-30 — End: 2020-05-05

## 2020-04-30 NOTE — Addendum Note (Signed)
Addended by: Kavin Leech on: 04/30/2020 04:25 PM   Modules accepted: Orders

## 2020-04-30 NOTE — Telephone Encounter (Signed)
rx sent

## 2020-04-30 NOTE — Telephone Encounter (Signed)
Patient has scheduled followup appt for 05/05/20. States he has one pill left and would like refill enough to get through to his appointment.

## 2020-05-04 ENCOUNTER — Other Ambulatory Visit: Payer: Self-pay

## 2020-05-05 ENCOUNTER — Telehealth (INDEPENDENT_AMBULATORY_CARE_PROVIDER_SITE_OTHER): Payer: PPO | Admitting: Family Medicine

## 2020-05-05 ENCOUNTER — Encounter: Payer: Self-pay | Admitting: Family Medicine

## 2020-05-05 DIAGNOSIS — G894 Chronic pain syndrome: Secondary | ICD-10-CM | POA: Diagnosis not present

## 2020-05-05 DIAGNOSIS — M545 Low back pain, unspecified: Secondary | ICD-10-CM

## 2020-05-05 DIAGNOSIS — G8929 Other chronic pain: Secondary | ICD-10-CM | POA: Diagnosis not present

## 2020-05-05 DIAGNOSIS — F411 Generalized anxiety disorder: Secondary | ICD-10-CM

## 2020-05-05 DIAGNOSIS — F339 Major depressive disorder, recurrent, unspecified: Secondary | ICD-10-CM | POA: Diagnosis not present

## 2020-05-05 DIAGNOSIS — M170 Bilateral primary osteoarthritis of knee: Secondary | ICD-10-CM

## 2020-05-05 MED ORDER — CLONAZEPAM 1 MG PO TABS: 1.0000 mg | ORAL_TABLET | Freq: Two times a day (BID) | ORAL | 5 refills | Status: DC | PRN

## 2020-05-05 MED ORDER — SERTRALINE HCL 50 MG PO TABS: 50.0000 mg | ORAL_TABLET | Freq: Every day | ORAL | 3 refills | Status: DC
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
## 2020-05-10 ENCOUNTER — Other Ambulatory Visit: Payer: Self-pay | Admitting: Family Medicine

## 2020-05-10 DIAGNOSIS — I10 Essential (primary) hypertension: Secondary | ICD-10-CM

## 2020-05-10 DIAGNOSIS — G894 Chronic pain syndrome: Secondary | ICD-10-CM

## 2020-05-11 ENCOUNTER — Other Ambulatory Visit: Payer: Self-pay | Admitting: Family Medicine

## 2020-05-11 DIAGNOSIS — G894 Chronic pain syndrome: Secondary | ICD-10-CM

## 2020-05-11 DIAGNOSIS — I10 Essential (primary) hypertension: Secondary | ICD-10-CM

## 2020-05-11 MED ORDER — MORPHINE SULFATE ER 30 MG PO TBCR: EXTENDED_RELEASE_TABLET | ORAL | 0 refills | Status: DC

## 2020-05-11 MED ORDER — OXYCODONE HCL 10 MG PO TABS: ORAL_TABLET | ORAL | 0 refills | Status: DC

## 2020-05-11 NOTE — Telephone Encounter (Addendum)
RF request received from pharmacy for both medications. Will send to PCP to refuse, medications refilled for the next 2 months today.

## 2020-05-11 NOTE — Telephone Encounter (Signed)
Medications sent for the 2 months today. Please refuse medication refills

## 2020-05-11 NOTE — Telephone Encounter (Addendum)
RF request received from pharmacy for both medications. Will send to PCP to refuse., medications sent for the next 2 months today.

## 2020-05-11 NOTE — Telephone Encounter (Signed)
OK. Morphine and oxycodone rx's for each of the next 2 months eRx'd today.

## 2020-05-11 NOTE — Telephone Encounter (Deleted)
Medications refilled today for the next 11months. Please refuse

## 2020-05-11 NOTE — Telephone Encounter (Signed)
RF request received from pharmacy for both medications. Will send to PCP to refuse.

## 2020-05-11 NOTE — Telephone Encounter (Signed)
Requesting: Morphine 30mg  Contract:10/27/19 UDS:07/16/19 Last Visit:05/05/20 Next Visit: advised to f/u 3 mo. Last Refill:02/09/20(90,0) Fill on/after 04/09/20  Requesting: Oxycodone 10mg  Contract:10/27/19 UDS:07/16/19 Last Visit:05/05/20 Next Visit:advised to f/u 3 mo. Last Refill:02/09/20(90,0) Fill on/after 04/09/20  Please Advise. Medications pending

## 2020-05-11 NOTE — Telephone Encounter (Signed)
Spoke with patient and advised refills were sent.

## 2020-05-17 ENCOUNTER — Other Ambulatory Visit: Payer: Self-pay

## 2020-05-17 ENCOUNTER — Encounter (HOSPITAL_COMMUNITY)
Admission: RE | Admit: 2020-05-17 | Discharge: 2020-05-17 | Disposition: A | Discharge status: Home / Self Care | Payer: PPO | Source: Ambulatory Visit | Attending: Ophthalmology | Admitting: Ophthalmology

## 2020-05-18 NOTE — H&P (Signed)
Surgical History & Physical  Patient Name: Richard Levine DOB: July 19, 1950  Surgery: Cataract extraction with intraocular lens implant phacoemulsification; Left Eye  Surgeon: Baruch Goldmann MD Surgery Date:  05/24/2020 Pre-Op Date:  05/18/2020  HPI: A 78 Yr. old male patient Pt referred by Dr. Jorja Loa for cataract evaluation. The patient complains of difficulty when seeing street signs, which began many years ago. Both eyes are affected. Left eye more than right. Pt wearing reading glasses prn. The episode is constant. The condition's severity is worsening. The complaint is associated with glare and halos. Symptoms are negatively affecting pt's quality of life. The patient experiences no increase in floaters. LBS-does not check but states it is stable A1C%-5.6 (Mar 02 2020) HPI Completed by Dr. Baruch Goldmann  Medical History: Cataracts Diabetes - DM Type 2 High Blood Pressure  Review of Systems Negative Allergic/Immunologic Negative Cardiovascular Negative Constitutional Negative Ear, Nose, Mouth & Throat Negative Endocrine Negative Eyes Negative Gastrointestinal Negative Genitourinary Negative Hemotologic/Lymphatic Negative Integumentary Negative Musculoskeletal Negative Neurological Negative Psychiatry Negative Respiratory  Social   Former smoker  Medication MORPHINE SULFATE ER, OXYCODONE HCL (IR), SERTRALINE HCL, METOPROLOL SUCCINATE ER, HYDROCHLOROTHIAZIDE, LISINOPRIL, CLONAZEPAM, Asprin, Atorvastatin, Multivitamin, Pantoprazole, Miralax, Sucralfate, Vitamin C, Xiaflex,   Sx/Procedures Foreign body removal,  Knee Replacement, Back Surgery x4, Titanium Bar In Neck, Hernia Sx,   Drug Allergies   NKDA  History & Physical: Heent:  Cataract, Left eye NECK: supple without bruits LUNGS: lungs clear to auscultation CV: regular rate and rhythm Abdomen: soft and non-tender  Impression & Plan: Assessment: 1.  COMBINED FORMS AGE RELATED CATARACT; Both Eyes (H25.813) 2.   Diabetes Type 2 No retinopathy (E11.9) 3.  BLEPHARITIS; Right Upper Lid, Right Lower Lid, Left Upper Lid, Left Lower Lid (H01.001, H01.002,H01.004,H01.005) 4.  DERMATOCHALASIS; Right Upper Lid, Left Upper Lid (H02.831, H02.834) 5.  OTHER BENIGN NEOPLASM OF SKIN OF LEFT LOWER EYELID, INCLUDING CANTHUS (D23.121)  Plan: 1.  Cataract accounts for the patient's decreased vision. This visual impairment is not correctable with a tolerable change in glasses or contact lenses. Cataract surgery with an implantation of a new lens should significantly improve the visual and functional status of the patient. Discussed all risks, benefits, alternatives, and potential complications. Discussed the procedures and recovery. Patient desires to have surgery. A-scan ordered and performed today for intra-ocular lens calculations. The surgery will be performed in order to improve vision for driving, reading, and for eye examinations. Recommend phacoemulsification with intra-ocular lens. Recommend Dextenza for post-operative pain and inflammation. Left Eye worse - first. Dilates well - shugarcaine by protocol. 2.  Stressed importance of blood sugar and blood pressure control, and also yearly eye examinations. Discussed the need for ongoing proactive ocular exams and treatment, hopefully before visual symptoms develop. 3.  recommend regular lid cleaning. 4.  Asymptomatic, recommend observation for now. Findings, prognosis and treatment options reviewed. 5.  along with lower lid and RUL. Symptomatic. Recommend excision and biopsy. Will perform after phaco PCIOL.

## 2020-05-21 ENCOUNTER — Other Ambulatory Visit (HOSPITAL_COMMUNITY): Payer: PPO

## 2020-05-21 ENCOUNTER — Other Ambulatory Visit (HOSPITAL_COMMUNITY)
Admission: RE | Admit: 2020-05-21 | Discharge: 2020-05-21 | Disposition: A | Discharge status: Home / Self Care | Payer: PPO | Source: Ambulatory Visit | Attending: Ophthalmology | Admitting: Ophthalmology

## 2020-05-25 ENCOUNTER — Other Ambulatory Visit: Payer: Self-pay | Admitting: Family Medicine

## 2020-05-31 DIAGNOSIS — H25812 Combined forms of age-related cataract, left eye: Secondary | ICD-10-CM | POA: Diagnosis not present

## 2020-06-01 NOTE — H&P (Signed)
Surgical History & Physical  Patient Name: Richard Levine DOB: 1950/05/27  Surgery: Cataract extraction with intraocular lens implant phacoemulsification; Left Eye  Surgeon: Baruch Goldmann MD Surgery Date:  06/07/2020 Pre-Op Date:  05/21/2020  HPI: A 68 Yr. old male patient Pt referred by Dr. Jorja Loa for cataract evaluation. The patient complains of difficulty when seeing street signs, which began many years ago. Both eyes are affected. Left eye more than right. Pt wearing reading glasses prn. The episode is constant. The condition's severity is worsening. The complaint is associated with glare and halos. Symptoms are negatively affecting pt's quality of life. The patient experiences no increase in floaters. LBS-does not check but states it is stable A1C%-5.6 (Mar 02 2020) HPI Completed by Dr. Baruch Goldmann  Medical History: Cataracts Macula Degeneration Diabetes - DM Type 2 High Blood Pressure  Review of Systems Negative Allergic/Immunologic Negative Cardiovascular Negative Constitutional Negative Ear, Nose, Mouth & Throat Negative Endocrine Negative Eyes Negative Gastrointestinal Negative Genitourinary Negative Hemotologic/Lymphatic Negative Integumentary Negative Musculoskeletal Negative Neurological Negative Psychiatry Negative Respiratory  Social   Former smoker   Medication MORPHINE SULFATE ER, OXYCODONE HCL (IR), SERTRALINE HCL, METOPROLOL SUCCINATE ER, HYDROCHLOROTHIAZIDE, LISINOPRIL, CLONAZEPAM, Asprin, Atorvastatin, Multivitamin, Pantoprazole, Miralax, Sucralfate, Vitamin C, Xiaflex,   Sx/Procedures Foreign body removal,  Knee Replacement, Back Surgery x4, Titanium Bar In Neck, Hernia Sx,   Drug Allergies   NKDA  History & Physical: Heent:  Cataract, Left eye NECK: supple without bruits LUNGS: lungs clear to auscultation CV: regular rate and rhythm Abdomen: soft and non-tender  Impression & Plan: Assessment: 1.  COMBINED FORMS AGE RELATED CATARACT; Both  Eyes (H25.813) 2.  Diabetes Type 2 No retinopathy (E11.9) 3.  BLEPHARITIS; Right Upper Lid, Right Lower Lid, Left Upper Lid, Left Lower Lid (H01.001, H01.002,H01.004,H01.005) 4.  DERMATOCHALASIS; Right Upper Lid, Left Upper Lid (H02.831, H02.834) 5.  OTHER BENIGN NEOPLASM OF SKIN OF LEFT LOWER EYELID, INCLUDING CANTHUS (D23.121)  Plan: 1.  Cataract accounts for the patient's decreased vision. This visual impairment is not correctable with a tolerable change in glasses or contact lenses. Cataract surgery with an implantation of a new lens should significantly improve the visual and functional status of the patient. Discussed all risks, benefits, alternatives, and potential complications. Discussed the procedures and recovery. Patient desires to have surgery. A-scan ordered and performed today for intra-ocular lens calculations. The surgery will be performed in order to improve vision for driving, reading, and for eye examinations. Recommend phacoemulsification with intra-ocular lens. Recommend Dextenza for post-operative pain and inflammation. Left Eye worse - first. Dilates well - shugarcaine by protocol. 2.  Stressed importance of blood sugar and blood pressure control, and also yearly eye examinations. Discussed the need for ongoing proactive ocular exams and treatment, hopefully before visual symptoms develop. 3.  recommend regular lid cleaning. 4.  Asymptomatic, recommend observation for now. Findings, prognosis and treatment options reviewed. 5.  along with lower lid and RUL. Symptomatic. Recommend excision and biopsy. Will perform after phaco PCIOL.

## 2020-06-02 ENCOUNTER — Other Ambulatory Visit: Payer: Self-pay

## 2020-06-02 ENCOUNTER — Encounter (HOSPITAL_COMMUNITY)
Admission: RE | Admit: 2020-06-02 | Discharge: 2020-06-02 | Disposition: A | Discharge status: Home / Self Care | Payer: PPO | Source: Ambulatory Visit | Attending: Ophthalmology | Admitting: Ophthalmology

## 2020-06-04 ENCOUNTER — Other Ambulatory Visit (HOSPITAL_COMMUNITY)
Admission: RE | Admit: 2020-06-04 | Discharge: 2020-06-04 | Disposition: A | Discharge status: Home / Self Care | Payer: PPO | Source: Ambulatory Visit | Attending: Ophthalmology | Admitting: Ophthalmology

## 2020-06-04 ENCOUNTER — Encounter (HOSPITAL_COMMUNITY): Payer: Self-pay | Admitting: Ophthalmology

## 2020-06-04 ENCOUNTER — Other Ambulatory Visit: Payer: Self-pay

## 2020-06-04 DIAGNOSIS — Z20822 Contact with and (suspected) exposure to covid-19: Secondary | ICD-10-CM | POA: Insufficient documentation

## 2020-06-04 DIAGNOSIS — Z01812 Encounter for preprocedural laboratory examination: Secondary | ICD-10-CM | POA: Insufficient documentation

## 2020-06-05 LAB — SARS CORONAVIRUS 2 (TAT 6-24 HRS): SARS Coronavirus 2: NEGATIVE

## 2020-06-07 ENCOUNTER — Encounter (HOSPITAL_COMMUNITY)
Admission: RE | Disposition: A | Discharge status: Home / Self Care | Payer: Self-pay | Source: Home / Self Care | Attending: Ophthalmology

## 2020-06-07 ENCOUNTER — Encounter (HOSPITAL_COMMUNITY): Payer: Self-pay | Admitting: Ophthalmology

## 2020-06-07 ENCOUNTER — Ambulatory Visit (HOSPITAL_COMMUNITY): Payer: PPO | Admitting: Anesthesiology

## 2020-06-07 ENCOUNTER — Other Ambulatory Visit: Payer: Self-pay

## 2020-06-07 ENCOUNTER — Ambulatory Visit (HOSPITAL_COMMUNITY)
Admission: RE | Admit: 2020-06-07 | Discharge: 2020-06-07 | Disposition: A | Discharge status: Home / Self Care | Payer: PPO | Attending: Ophthalmology | Admitting: Ophthalmology

## 2020-06-07 DIAGNOSIS — D23121 Other benign neoplasm of skin of left upper eyelid, including canthus: Secondary | ICD-10-CM | POA: Insufficient documentation

## 2020-06-07 DIAGNOSIS — Z79899 Other long term (current) drug therapy: Secondary | ICD-10-CM | POA: Insufficient documentation

## 2020-06-07 DIAGNOSIS — Z7982 Long term (current) use of aspirin: Secondary | ICD-10-CM | POA: Insufficient documentation

## 2020-06-07 DIAGNOSIS — H02834 Dermatochalasis of left upper eyelid: Secondary | ICD-10-CM | POA: Insufficient documentation

## 2020-06-07 DIAGNOSIS — F418 Other specified anxiety disorders: Secondary | ICD-10-CM | POA: Diagnosis not present

## 2020-06-07 DIAGNOSIS — H25813 Combined forms of age-related cataract, bilateral: Secondary | ICD-10-CM | POA: Diagnosis not present

## 2020-06-07 DIAGNOSIS — H02831 Dermatochalasis of right upper eyelid: Secondary | ICD-10-CM | POA: Diagnosis not present

## 2020-06-07 DIAGNOSIS — H25812 Combined forms of age-related cataract, left eye: Secondary | ICD-10-CM | POA: Diagnosis not present

## 2020-06-07 DIAGNOSIS — Z87891 Personal history of nicotine dependence: Secondary | ICD-10-CM | POA: Diagnosis not present

## 2020-06-07 DIAGNOSIS — H0100B Unspecified blepharitis left eye, upper and lower eyelids: Secondary | ICD-10-CM | POA: Insufficient documentation

## 2020-06-07 DIAGNOSIS — E1136 Type 2 diabetes mellitus with diabetic cataract: Secondary | ICD-10-CM | POA: Insufficient documentation

## 2020-06-07 DIAGNOSIS — H0100A Unspecified blepharitis right eye, upper and lower eyelids: Secondary | ICD-10-CM | POA: Insufficient documentation

## 2020-06-07 DIAGNOSIS — Z79891 Long term (current) use of opiate analgesic: Secondary | ICD-10-CM | POA: Diagnosis not present

## 2020-06-07 DIAGNOSIS — Z96619 Presence of unspecified artificial shoulder joint: Secondary | ICD-10-CM | POA: Insufficient documentation

## 2020-06-07 HISTORY — PX: CATARACT EXTRACTION W/PHACO: SHX586

## 2020-06-07 LAB — GLUCOSE, CAPILLARY
Glucose-Capillary: 106 mg/dL — ABNORMAL HIGH (ref 70–99)
Glucose-Capillary: 105 mg/dL — ABNORMAL HIGH (ref 70–99)

## 2020-06-07 SURGERY — PHACOEMULSIFICATION, CATARACT, WITH IOL INSERTION
Anesthesia: Monitor Anesthesia Care | Site: Eye | Laterality: Left

## 2020-06-07 MED ORDER — ONDANSETRON HCL 4 MG/2ML IJ SOLN: 4.0000 mg | Freq: Once | INTRAMUSCULAR | Status: AC

## 2020-06-07 MED ORDER — ONDANSETRON HCL 4 MG/2ML IJ SOLN
INTRAMUSCULAR | Status: AC
  Administered 2020-06-07: 4 mg via INTRAVENOUS
  Filled 2020-06-07: qty 2

## 2020-06-07 MED ORDER — SODIUM HYALURONATE 23 MG/ML IO SOLN
INTRAOCULAR | Status: DC | PRN
  Administered 2020-06-07: 0.6 mL via INTRAOCULAR

## 2020-06-07 MED ORDER — LIDOCAINE HCL 3.5 % OP GEL
1.0000 "application " | Freq: Once | OPHTHALMIC | Status: AC
  Administered 2020-06-07: 1 via OPHTHALMIC

## 2020-06-07 MED ORDER — TETRACAINE HCL 0.5 % OP SOLN
1.0000 [drp] | OPHTHALMIC | Status: AC | PRN
  Administered 2020-06-07 (×3): 1 [drp] via OPHTHALMIC

## 2020-06-07 MED ORDER — PROVISC 10 MG/ML IO SOLN
INTRAOCULAR | Status: DC | PRN
  Administered 2020-06-07: 0.85 mL via INTRAOCULAR

## 2020-06-07 MED ORDER — LIDOCAINE HCL (PF) 1 % IJ SOLN
INTRAOCULAR | Status: DC | PRN
  Administered 2020-06-07: 1 mL via OPHTHALMIC

## 2020-06-07 MED ORDER — EPINEPHRINE PF 1 MG/ML IJ SOLN
INTRAOCULAR | Status: DC | PRN
  Administered 2020-06-07: 500 mL

## 2020-06-07 MED ORDER — ONDANSETRON HCL 4 MG/2ML IJ SOLN
INTRAMUSCULAR | Status: AC
  Filled 2020-06-07: qty 2

## 2020-06-07 MED ORDER — CYCLOPENTOLATE-PHENYLEPHRINE 0.2-1 % OP SOLN
1.0000 [drp] | OPHTHALMIC | Status: AC | PRN
  Administered 2020-06-07 (×3): 1 [drp] via OPHTHALMIC

## 2020-06-07 MED ORDER — PHENYLEPHRINE HCL 2.5 % OP SOLN
1.0000 [drp] | OPHTHALMIC | Status: AC | PRN
  Administered 2020-06-07 (×3): 1 [drp] via OPHTHALMIC

## 2020-06-07 MED ORDER — STERILE WATER FOR IRRIGATION IR SOLN
Status: DC | PRN
  Administered 2020-06-07: 250 mL

## 2020-06-07 MED ORDER — BSS IO SOLN
INTRAOCULAR | Status: DC | PRN
  Administered 2020-06-07: 15 mL via INTRAOCULAR

## 2020-06-07 MED ORDER — NEOMYCIN-POLYMYXIN-DEXAMETH 3.5-10000-0.1 OP SUSP
OPHTHALMIC | Status: DC | PRN
  Administered 2020-06-07: 1 [drp] via OPHTHALMIC

## 2020-06-07 MED ORDER — POVIDONE-IODINE 5 % OP SOLN
OPHTHALMIC | Status: DC | PRN
  Administered 2020-06-07: 1 via OPHTHALMIC

## 2020-06-07 SURGICAL SUPPLY — 13 items
CLOTH BEACON ORANGE TIMEOUT ST (SAFETY) ×1 IMPLANT
EYE SHIELD UNIVERSAL CLEAR (GAUZE/BANDAGES/DRESSINGS) ×1 IMPLANT
GLOVE SURG UNDER POLY LF SZ6.5 (GLOVE) ×1 IMPLANT
GLOVE SURG UNDER POLY LF SZ7 (GLOVE) ×1 IMPLANT
NDL HYPO 18GX1.5 BLUNT FILL (NEEDLE) IMPLANT
NEEDLE HYPO 18GX1.5 BLUNT FILL (NEEDLE) ×2 IMPLANT
PAD ARMBOARD 7.5X6 YLW CONV (MISCELLANEOUS) ×1 IMPLANT
SYR TB 1ML LL NO SAFETY (SYRINGE) ×1 IMPLANT
TAPE SURG TRANSPORE 1 IN (GAUZE/BANDAGES/DRESSINGS) IMPLANT
TAPE SURGICAL TRANSPORE 1 IN (GAUZE/BANDAGES/DRESSINGS) ×2
TECNIS IOL (Intraocular Lens) ×1 IMPLANT
VISCOELASTIC ADDITIONAL (OPHTHALMIC RELATED) IMPLANT
WATER STERILE IRR 250ML POUR (IV SOLUTION) ×1 IMPLANT

## 2020-06-07 NOTE — Interval H&P Note (Signed)
History and Physical Interval Note:  06/07/2020 11:45 AM  Richard Levine  has presented today for surgery, with the diagnosis of Nuclear sclerotic cataract - Left eye.  The various methods of treatment have been discussed with the patient and family. After consideration of risks, benefits and other options for treatment, the patient has consented to  Procedure(s) with comments: CATARACT EXTRACTION PHACO AND INTRAOCULAR LENS PLACEMENT ([facility]) (Left) - CDE:  as a surgical intervention.  The patient's history has been reviewed, patient examined, no change in status, stable for surgery.  I have reviewed the patient's chart and labs.  Questions were answered to the patient's satisfaction.     Baruch Goldmann

## 2020-06-07 NOTE — Op Note (Signed)
Date of procedure: 06/07/20  Pre-operative diagnosis: Visually significant age-related combined cataract, Left Eye (H25.812)  Post-operative diagnosis: Visually significant age-related combined cataract, Left Eye (H25.812)  Procedure: Removal of cataract via phacoemulsification and insertion of intra-ocular lens Wynetta Emery and [place]  +16.0D into the capsular bag of the Left Eye  Attending surgeon: Gerda Diss. Shakyra Mattera, MD, MA  Anesthesia: MAC, Topical [place]  Complications: None  Estimated Blood Loss: <70m (minimal)  Specimens: None  Implants: As above  Indications:  Visually significant age-related cataract, Left Eye  Procedure:  The patient was seen and identified in the pre-operative area. The operative eye was identified and dilated.  The operative eye was marked.  Topical anesthesia was administered to the operative eye.     The patient was then to the operative suite and placed in the supine position.  A timeout was performed confirming the patient, procedure to be performed, and all other relevant information.   The patient's face was prepped and draped in the usual fashion for intra-ocular surgery.  A lid speculum was placed into the operative eye and the surgical microscope moved into place and focused.  An inferotemporal paracentesis was created using a 20 gauge paracentesis blade.  Shugarcaine was injected into the anterior chamber.  Viscoelastic was injected into the anterior chamber.  A temporal clear-corneal main wound incision was created using a 2.442mmicrokeratome.  A continuous curvilinear capsulorrhexis was initiated using an irrigating cystitome and completed using capsulorrhexis forceps.  Hydrodissection and hydrodeliniation were performed.  Viscoelastic was injected into the anterior chamber.  A phacoemulsification handpiece and a chopper as a second instrument were used to remove the nucleus and epinucleus. The irrigation/aspiration handpiece was used to remove  any remaining cortical material.   The capsular bag was reinflated with viscoelastic, checked, and found to be intact.  The intraocular lens was inserted into the capsular bag.  The irrigation/aspiration handpiece was used to remove any remaining viscoelastic.  The clear corneal wound and paracentesis wounds were then hydrated and checked with Weck-Cels to be watertight.  The lid-speculum was removed.  The drape was removed.  The patient's face was cleaned with a wet and dry 4x4.   Maxitrol was instilled in the eye. A clear shield was taped over the eye. The patient was taken to the post-operative care unit in good condition, having tolerated the procedure well.  Post-Op Instructions: [facility] and will receive all other orders and instructions.

## 2020-06-07 NOTE — Transfer of Care (Signed)
Immediate Anesthesia Transfer of Care Note  Patient: Richard Levine  Procedure(s) Performed: CATARACT EXTRACTION PHACO AND INTRAOCULAR LENS PLACEMENT ([facility]) (Left Eye)  Patient Location: Endoscopy Unit  Anesthesia Type:MAC  Level of Consciousness: awake  Airway & Oxygen Therapy: Patient Spontanous Breathing  Post-op Assessment: Report given to RN  Post vital signs: Reviewed  Last Vitals:  Vitals Value Taken Time  BP    Temp    Pulse    Resp    SpO2      Last Pain:  Vitals:   06/07/20 1051  TempSrc: Oral  PainSc: 4          Complications: No complications documented.

## 2020-06-07 NOTE — Anesthesia Postprocedure Evaluation (Signed)
Anesthesia Post Note  Patient: [name]  Procedure(s) Performed: CATARACT EXTRACTION PHACO AND INTRAOCULAR LENS PLACEMENT ([place]) (Left Eye)  Patient location during evaluation: PACU Anesthesia Type: MAC Level of consciousness: awake and alert and oriented Pain management: pain level controlled Vital Signs Assessment: post-procedure vital signs reviewed and stable Respiratory status: spontaneous breathing Cardiovascular status: blood pressure returned to baseline and stable Postop Assessment: no apparent nausea or vomiting Anesthetic complications: no   No complications documented.   Last Vitals:  Vitals:   06/07/20 1051  BP: 101/77  Pulse: 81  Resp: 11  Temp: 37 C  SpO2: 93%    Last Pain:  Vitals:   06/07/20 1051  TempSrc: Oral  PainSc: 4                  Donatello Kleve

## 2020-06-07 NOTE — Anesthesia Preprocedure Evaluation (Signed)
Anesthesia Evaluation  Patient identified by MRN, date of birth, ID band Patient awake    Reviewed: Allergy & Precautions, NPO status , Patient's Chart, lab work & pertinent test results, reviewed documented beta blocker date and time   History of Anesthesia Complications Negative for: history of anesthetic complications  Airway Mallampati: II  TM Distance: >3 FB Neck ROM: Full    Dental  (+) Dental Advisory Given, Caps, Poor Dentition, Partial Upper, Missing   Pulmonary pneumonia,    Pulmonary exam normal breath sounds clear to auscultation       Cardiovascular Exercise Tolerance: Good hypertension, Pt. on medications and Pt. on home beta blockers Normal cardiovascular exam Rhythm:Regular Rate:Normal     Neuro/Psych Seizures -, Well Controlled,  PSYCHIATRIC DISORDERS Anxiety Depression Schizophrenia  Neuromuscular disease (right foot drop)    GI/Hepatic GERD  Medicated,  Endo/Other  diabetes, Well Controlled, Type 2  Renal/GU Renal InsufficiencyRenal disease     Musculoskeletal  (+) Arthritis  (lumbar laminectomy), narcotic dependent  Abdominal   Peds  Hematology negative hematology ROS (+)   Anesthesia Other Findings Chronic pain syndrome  Reproductive/Obstetrics negative OB ROS                             Anesthesia Physical Anesthesia Plan  ASA: III  Anesthesia Plan: MAC   Post-op Pain Management:    Induction:   PONV Risk Score and Plan:   Airway Management Planned: Nasal Cannula and Natural Airway  Additional Equipment:   Intra-op Plan:   Post-operative Plan:   Informed Consent: I have reviewed the patients History and Physical, chart, labs and discussed the procedure including the risks, benefits and alternatives for the proposed anesthesia with the patient or authorized representative who has indicated his/her understanding and acceptance.     Dental advisory  given  Plan Discussed with: CRNA and Surgeon  Anesthesia Plan Comments:         Anesthesia Quick Evaluation

## 2020-06-07 NOTE — Discharge Instructions (Addendum)
Please discharge patient when stable, will follow up today with Dr. Marisa Hua at the [facility] office immediately following discharge.  Leave shield in place until visit.  All paperwork with discharge instructions will be given at the office.  [facility] Address:  [place] [place], [place] [address]    Moderate Conscious Sedation, Adult, Care After This sheet gives you information about how to care for yourself after your procedure. Your health care provider may also give you more specific instructions. If you have problems or questions, contact your health care provider. What can I expect after the procedure? After the procedure, it is common to have:  Sleepiness for several hours.  Impaired judgment for several hours.  Difficulty with balance.  Vomiting if you eat too soon. Follow these instructions at home: For the time period you were told by your health care provider:  Rest.  Do not participate in activities where you could fall or become injured.  Do not drive or use machinery.  Do not drink alcohol.  Do not take sleeping pills or medicines that cause drowsiness.  Do not make important decisions or sign legal documents.  Do not take care of children on your own.      Eating and drinking  Follow the diet recommended by your health care provider.  Drink enough fluid to keep your urine pale yellow.  If you vomit: ? Drink water, juice, or soup when you can drink without vomiting. ? Make sure you have little or no nausea before eating solid foods.   General instructions  Take over-the-counter and prescription medicines only as told by your health care provider.  Have a responsible adult stay with you for the time you are told. It is important to have someone help care for you until you are awake and alert.  Do not smoke.  Keep all follow-up visits as told by your health care provider. This is important. Contact a  health care provider if:  You are still sleepy or having trouble with balance after 24 hours.  You feel light-headed.  You keep feeling nauseous or you keep vomiting.  You develop a rash.  You have a fever.  You have redness or swelling around the IV site. Get help right away if:  You have trouble breathing.  You have new-onset confusion at home. Summary  After the procedure, it is common to feel sleepy, have impaired judgment, or feel nauseous if you eat too soon.  Rest after you get home. Know the things you should not do after the procedure.  Follow the diet recommended by your health care provider and drink enough fluid to keep your urine pale yellow.  Get help right away if you have trouble breathing or new-onset confusion at home. This information is not intended to replace advice given to you by your health care provider. Make sure you discuss any questions you have with your health care provider. Document Revised: 07/11/2019 Document Reviewed: 02/06/2019 Elsevier Patient Education  2021 [employer].

## 2020-06-08 ENCOUNTER — Encounter (HOSPITAL_COMMUNITY): Payer: Self-pay | Admitting: Ophthalmology

## 2020-06-10 ENCOUNTER — Encounter: Payer: Self-pay | Admitting: Family Medicine

## 2020-06-13 ENCOUNTER — Other Ambulatory Visit: Payer: Self-pay | Admitting: Family Medicine

## 2020-06-14 NOTE — Telephone Encounter (Signed)
I don't understand why this RF request is needed. I did rx for clonaz 1mg  on 05/05/20 for #60 with 5 RFs [facility]).  He should have RF's available.

## 2020-06-14 NOTE — Telephone Encounter (Signed)
Confirmed with the pharmacy that rx for 1mg  tab was not received from 05/05/20. Pt did pick up 0.5mg  tab rx yesterday from 04/22/20 for qty 60.   Please advise, thanks.

## 2020-06-14 NOTE — Telephone Encounter (Signed)
Requesting: clonazepam Contract: 10/27/19 UDS: 07/16/19 Last Visit: 05/05/20 Next Visit: nothing scheduled Last Refill: 05/05/20  Please Advise\

## 2020-06-16 NOTE — Telephone Encounter (Signed)
Spoke with pt regarding rx and instructions.   

## 2020-06-16 NOTE — Telephone Encounter (Signed)
I reviewed [employer] [place] again today and it shows that he filled clonaz 0.5mg  rx on 06/13/20.  I will deny this current RF request for the 1mg  clonaz. Pls call pt and tell him that since he is filling rx for 0.5mg  clonaz I am going to take the 1mg  clonaz off his med list and cancel any RFs for the 1 mg clonaz at [facility]. From this point on he should be taking clonaz 0.5mg  strength.---thx

## 2020-06-16 NOTE — Telephone Encounter (Signed)
LM for pt to return call regarding rx.  

## 2020-06-20 ENCOUNTER — Other Ambulatory Visit: Payer: Self-pay | Admitting: Family Medicine

## 2020-06-21 ENCOUNTER — Encounter (HOSPITAL_COMMUNITY): Payer: PPO

## 2020-06-21 MED ORDER — LISINOPRIL 10 MG PO TABS: 10.0000 mg | ORAL_TABLET | Freq: Every day | ORAL | 0 refills | Status: DC

## 2020-06-21 MED ORDER — METOPROLOL SUCCINATE ER 50 MG PO TB24: 50.0000 mg | ORAL_TABLET | Freq: Every day | ORAL | 0 refills | Status: DC

## 2020-06-21 MED ORDER — HYDROCHLOROTHIAZIDE 25 MG PO TABS: 25.0000 mg | ORAL_TABLET | Freq: Every day | ORAL | 0 refills | Status: DC

## 2020-06-23 ENCOUNTER — Other Ambulatory Visit (HOSPITAL_COMMUNITY): Payer: PPO

## 2020-06-25 ENCOUNTER — Ambulatory Visit (HOSPITAL_COMMUNITY): Admission: RE | Admit: 2020-06-25 | Payer: PPO | Source: Home / Self Care | Admitting: Ophthalmology

## 2020-06-25 ENCOUNTER — Encounter (HOSPITAL_COMMUNITY): Admission: RE | Payer: Self-pay | Source: Home / Self Care

## 2020-06-25 SURGERY — PHACOEMULSIFICATION, CATARACT, WITH IOL INSERTION
Anesthesia: Monitor Anesthesia Care | Laterality: Right

## 2020-07-07 ENCOUNTER — Other Ambulatory Visit: Payer: Self-pay | Admitting: Family Medicine

## 2020-07-07 DIAGNOSIS — G894 Chronic pain syndrome: Secondary | ICD-10-CM

## 2020-07-07 DIAGNOSIS — I10 Essential (primary) hypertension: Secondary | ICD-10-CM

## 2020-07-08 ENCOUNTER — Encounter: Payer: Self-pay | Admitting: Family Medicine

## 2020-07-08 NOTE — Telephone Encounter (Signed)
Requesting: Oxycodone Contract: 10/27/19 UDS: 07/16/19 Last Visit: 05/05/20 Next Visit: advised to f/u 3 months Last Refill: 05/11/20, electronic refill for March #90   Requesting: Morphine Contract: 10/27/19 UDS: 07/16/19 Last Visit: 05/05/20 Next Visit: advised to f/u 3 months Last Refill:05/11/20, electronic refill for March #90  Please Advise. Medications pending

## 2020-08-05 ENCOUNTER — Other Ambulatory Visit: Payer: Self-pay | Admitting: Family Medicine

## 2020-08-05 ENCOUNTER — Encounter: Payer: Self-pay | Admitting: Family Medicine

## 2020-08-05 DIAGNOSIS — I10 Essential (primary) hypertension: Secondary | ICD-10-CM

## 2020-08-05 DIAGNOSIS — G894 Chronic pain syndrome: Secondary | ICD-10-CM

## 2020-08-05 MED ORDER — MORPHINE SULFATE ER 30 MG PO TBCR: EXTENDED_RELEASE_TABLET | ORAL | 0 refills | Status: DC

## 2020-08-05 MED ORDER — OXYCODONE HCL 10 MG PO TABS: ORAL_TABLET | ORAL | 0 refills | Status: DC

## 2020-08-05 MED ORDER — CLONAZEPAM 0.5 MG PO TABS: 0.5000 mg | ORAL_TABLET | Freq: Two times a day (BID) | ORAL | 5 refills | Status: DC | PRN

## 2020-08-05 NOTE — Telephone Encounter (Signed)
OK, RFs for clonaz 0.5mg  and his morphine and oxycodone sent today. Pt needs f/u sometime in the next 1 month.

## 2020-08-05 NOTE — Telephone Encounter (Signed)
Verified with pharmacy, last refill for clonazepam 0.5mg  was on 07/11/20 #60 30 d/s. Last refill for 1mg  was 05/19/20 which is he no longer taking, confirmed this will be removed from profile. Currently still showing on his med list.  Requesting: clonazepam Contract:10/27/19 UDS: 07/16/19 Last Visit: 05/05/20 Next Visit: advised to f/u 3 months Last Refill: 07/11/20 (60,0)  Requesting: oxycodone Contract: 10/27/19 UDS: 07/16/19 Last Visit: 05/05/20 Next Visit: advised to f/u 3 months Last Refill: 07/12/20 (90,0)   Requesting: morphine (MS Contin) Contract: 10/27/19 UDS: 07/16/19 Last Visit: 05/05/20 Next Visit: advised to f/u 3 months Last Refill: 07/12/20 (90,0)  Please Advise. Medications pending

## 2020-09-05 IMAGING — DX DG CHEST 2V
2 series · 2 of 2 positions shown · non-contrast
Comparison: CT 04/29/2017.  Chest x-ray 04/29/2017.

CLINICAL DATA: Pneumonia.

EXAM:
CHEST - 2 VIEW

[chest pa]
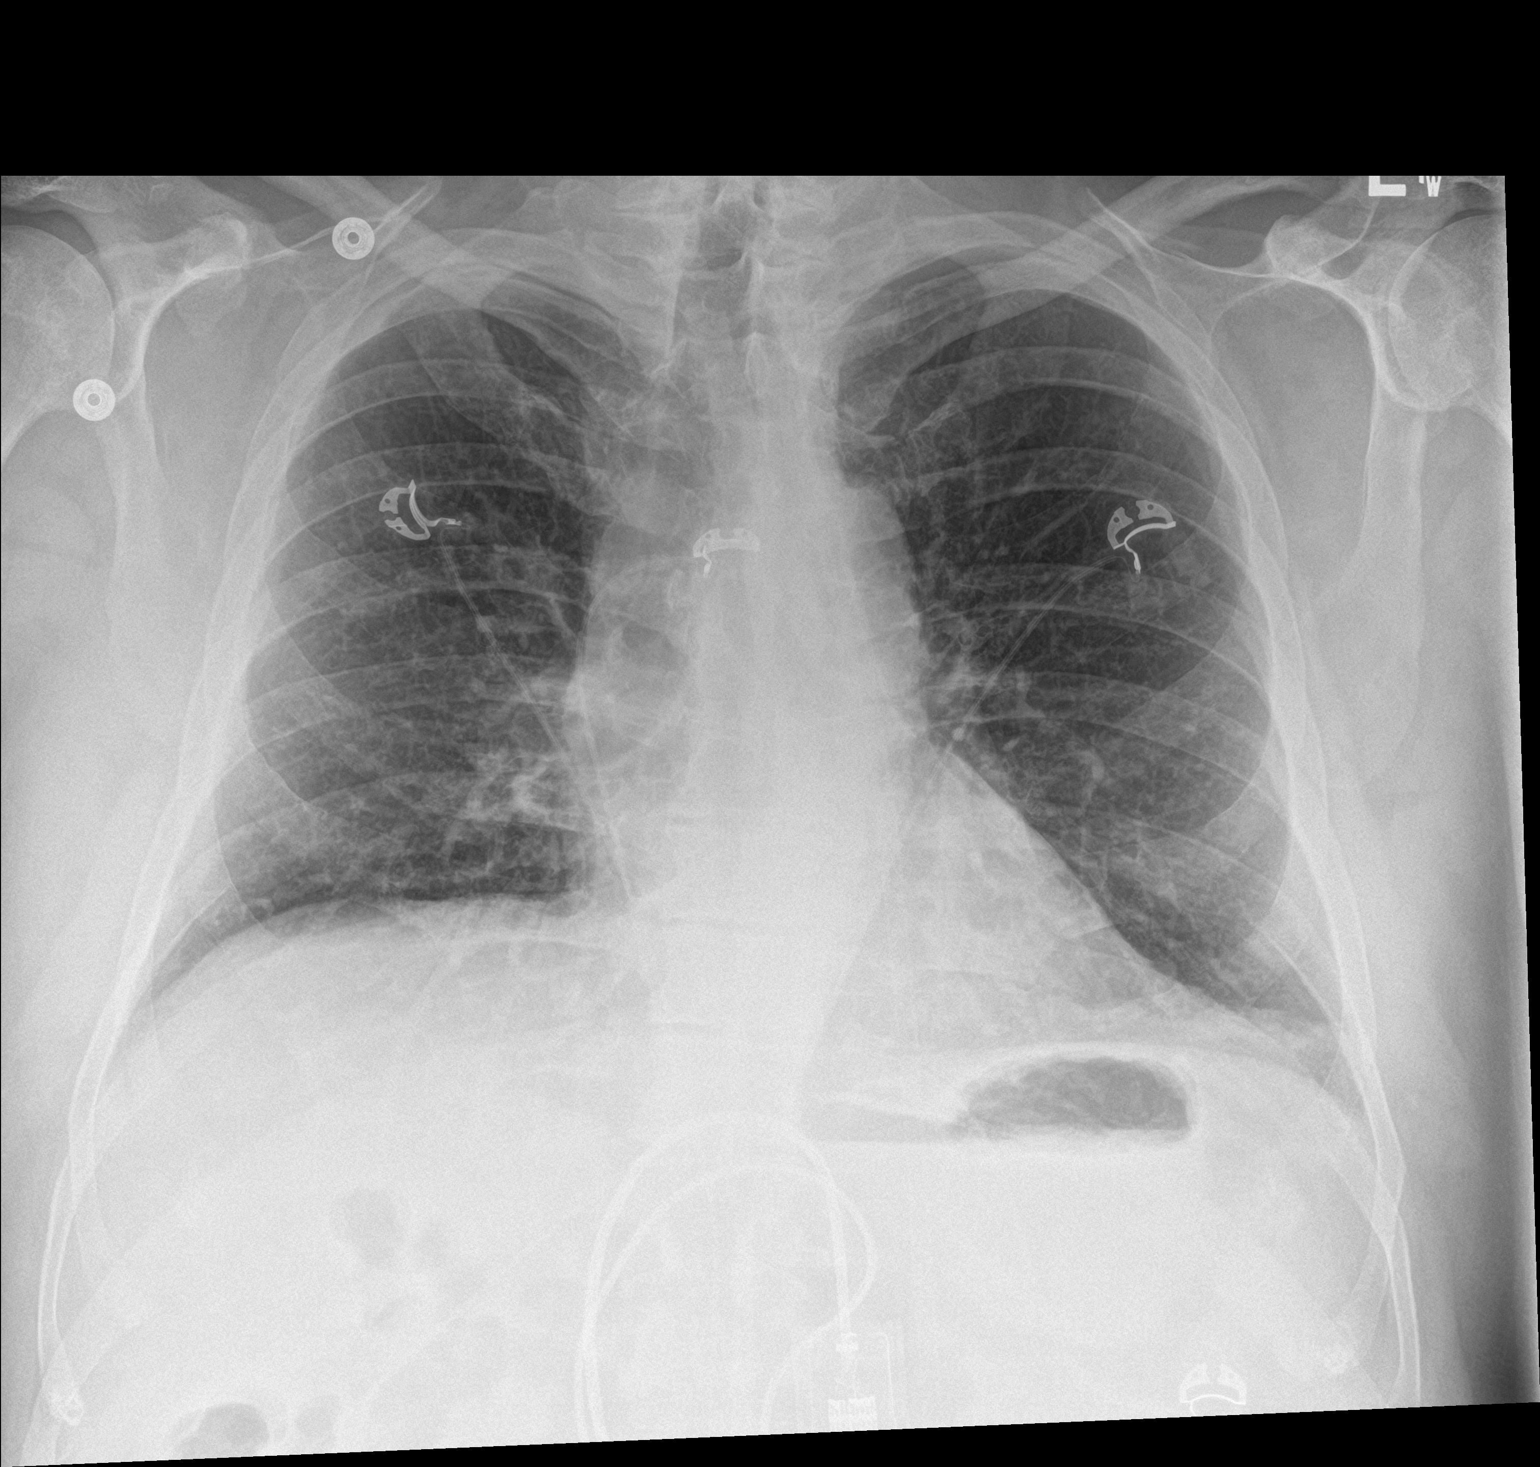

[chest lat]
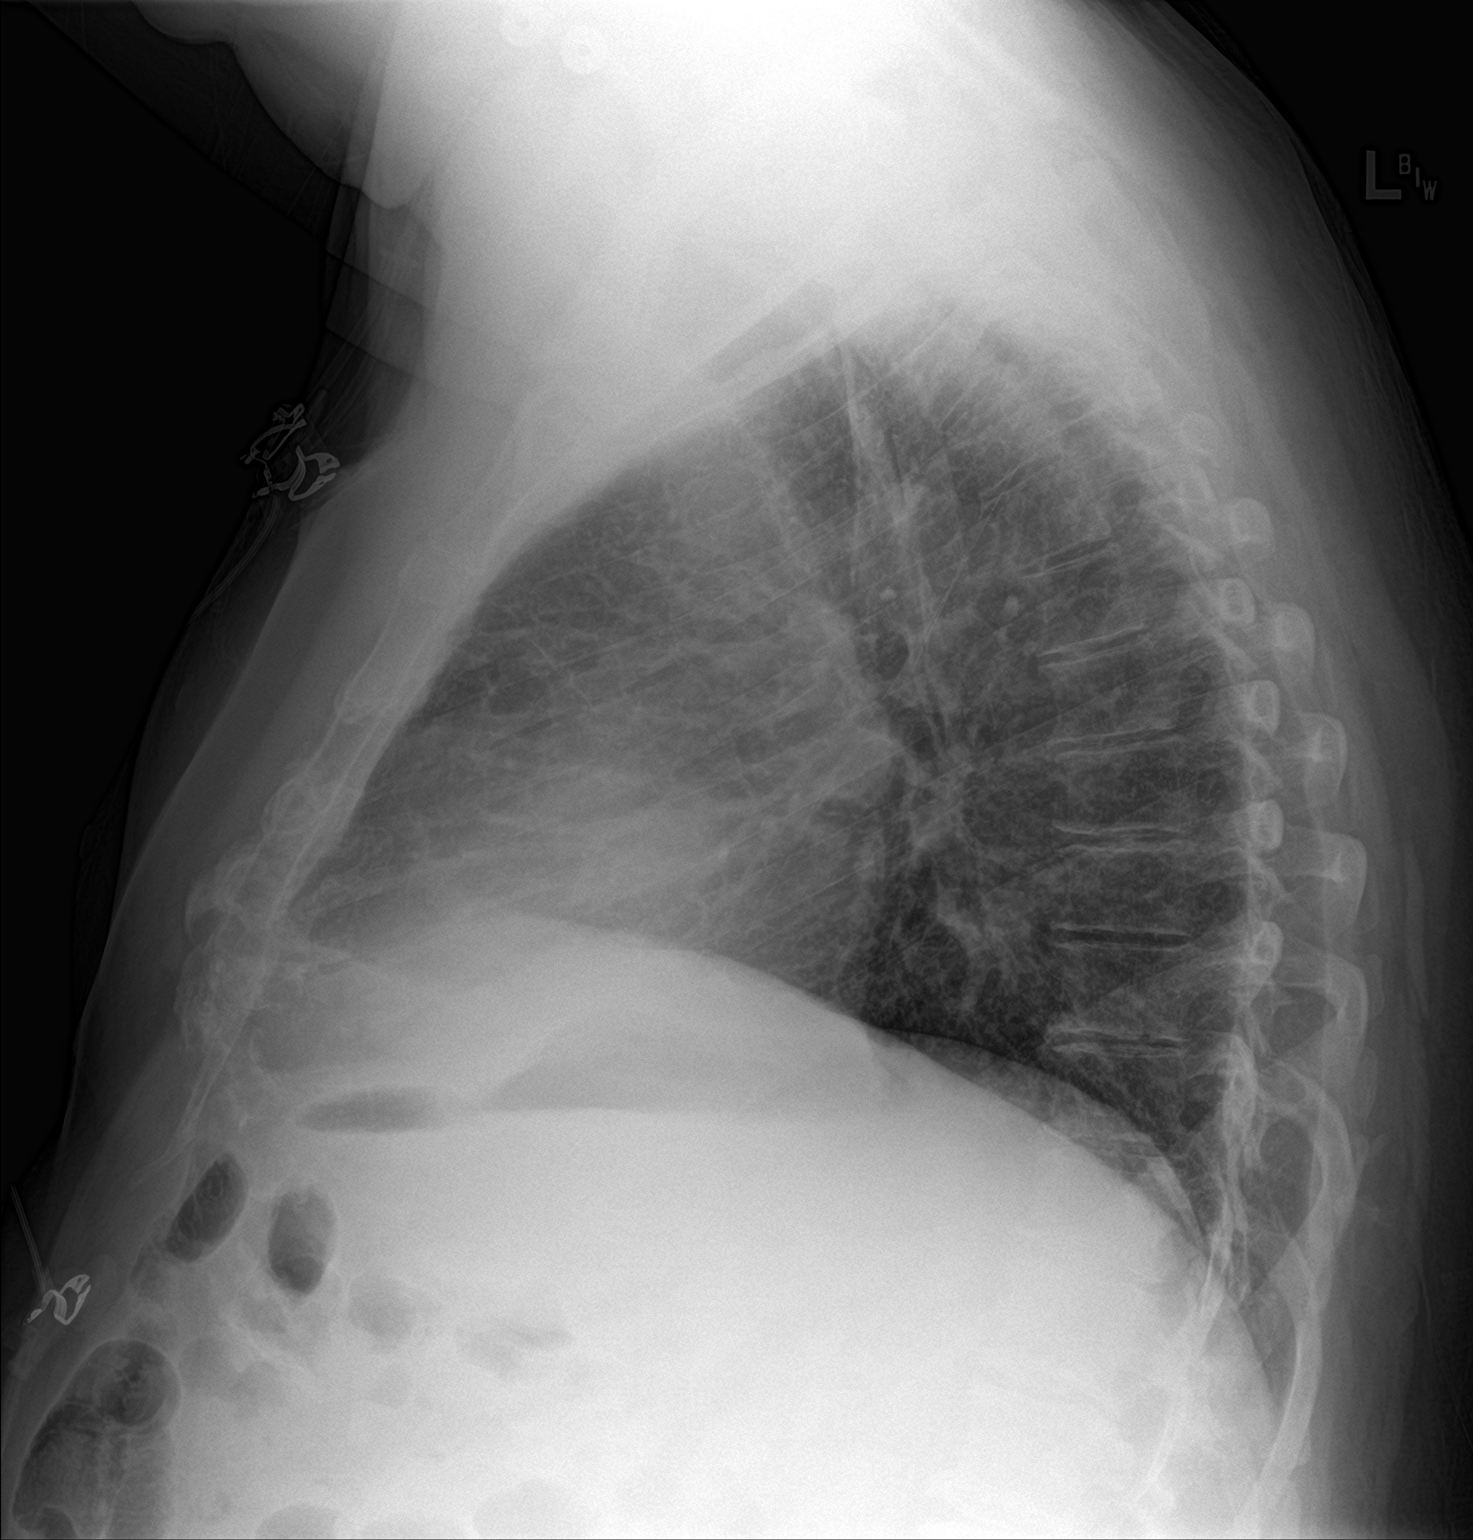

[2 of 2 positions shown; findings below may reference images not displayed]

FINDINGS: Mediastinum hilar structures normal. Heart size normal. Low lung
volumes with mild bibasilar atelectasis. No pleural effusion or
pneumothorax. Degenerative changes scoliosis thoracic spine.
IMPRESSION: Low lung volumes with mild bibasilar atelectasis.

## 2020-09-06 ENCOUNTER — Other Ambulatory Visit: Payer: Self-pay | Admitting: Family Medicine

## 2020-09-06 ENCOUNTER — Encounter: Payer: Self-pay | Admitting: Family Medicine

## 2020-09-06 DIAGNOSIS — I10 Essential (primary) hypertension: Secondary | ICD-10-CM

## 2020-09-06 DIAGNOSIS — G894 Chronic pain syndrome: Secondary | ICD-10-CM

## 2020-09-06 MED ORDER — MORPHINE SULFATE ER 30 MG PO TBCR: EXTENDED_RELEASE_TABLET | ORAL | 0 refills | Status: DC

## 2020-09-06 MED ORDER — OXYCODONE HCL 10 MG PO TABS: ORAL_TABLET | ORAL | 0 refills | Status: DC

## 2020-09-06 NOTE — Telephone Encounter (Signed)
Pt notified via My Chart

## 2020-09-06 NOTE — Telephone Encounter (Signed)
I will rx 2 week supply---he is overdue for f/u chronic pain.

## 2020-09-06 NOTE — Telephone Encounter (Signed)
Requesting: Morphine Contract: 10/27/19 UDS: 07/16/19 Last Visit: 05/05/20 Next Visit: advised to f/u 3 mo. Last Refill: 08/05/20(90,0)  Requesting: Oxycodone Contract: 10/27/19 UDS: 07/16/19 Last Visit: 05/05/20 Next Visit: advised to f/u 3 mo. Last Refill: 08/05/20(90,0)  Please Advise. Meds pending

## 2020-09-08 ENCOUNTER — Ambulatory Visit (INDEPENDENT_AMBULATORY_CARE_PROVIDER_SITE_OTHER): Payer: PPO | Admitting: *Deleted

## 2020-09-08 DIAGNOSIS — Z0000 Encounter for general adult medical examination without abnormal findings: Secondary | ICD-10-CM | POA: Diagnosis not present
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
## 2020-09-08 NOTE — Patient Instructions (Signed)
[name] , Thank you for taking time to come for your Medicare Wellness Visit. I appreciate your ongoing commitment to your health goals. Please review the following plan we discussed and let me know if I can assist you in the future.   Screening recommendations/referrals: Colonoscopy: completed 03-31-2016    Recommended yearly ophthalmology/optometry visit for glaucoma screening and checkup Recommended yearly dental visit for hygiene and checkup  Vaccinations: Influenza vaccine: Education provided Pneumococcal vaccine: Education provided Tdap vaccine: Education provided Shingles vaccine: Education provided  Advanced directives:  Education provided  Conditions/risks identified: NA  Next appointment: 09-13-2021 @ 2:15  Medicare Annual Wellness  Preventive Care 39 Years and Older, Male Preventive care refers to lifestyle choices and visits with your health care provider that can promote health and wellness. What does preventive care include? A yearly physical exam. This is also called an annual well check. Dental exams once or twice a year. Routine eye exams. Ask your health care provider how often you should have your eyes checked. Personal lifestyle choices, including: Daily care of your teeth and gums. Regular physical activity. Eating a healthy diet. Avoiding tobacco and drug use. Limiting alcohol use. Practicing safe sex. Taking low doses of aspirin every day. Taking vitamin and mineral supplements as recommended by your health care provider. What happens during an annual well check? The services and screenings done by your health care provider during your annual well check will depend on your age, overall health, lifestyle risk factors, and family history of disease. Counseling  Your health care provider may ask you questions about your: Alcohol use. Tobacco use. Drug use. Emotional well-being. Home and relationship well-being. Sexual activity. Eating habits. History of  falls. Memory and ability to understand (cognition). Work and work Statistician. Screening  You may have the following tests or measurements: Height, weight, and BMI. Blood pressure. Lipid and cholesterol levels. These may be checked every 5 years, or more frequently if you are over 51 years old. Skin check. Lung cancer screening. You may have this screening every year starting at age 60 if you have a 30-pack-year history of smoking and currently smoke or have quit within the past 15 years. Fecal occult blood test (FOBT) of the stool. You may have this test every year starting at age 56. Flexible sigmoidoscopy or colonoscopy. You may have a sigmoidoscopy every 5 years or a colonoscopy every 10 years starting at age 32. Prostate cancer screening. Recommendations will vary depending on your family history and other risks. Hepatitis C blood test. Hepatitis B blood test. Sexually transmitted disease (STD) testing. Diabetes screening. This is done by checking your blood sugar (glucose) after you have not eaten for a while (fasting). You may have this done every 1-3 years. Abdominal aortic aneurysm (AAA) screening. You may need this if you are a current or former smoker. Osteoporosis. You may be screened starting at age 68 if you are at high risk. Talk with your health care provider about your test results, treatment options, and if necessary, the need for more tests. Vaccines  Your health care provider may recommend certain vaccines, such as: Influenza vaccine. This is recommended every year. Tetanus, diphtheria, and acellular pertussis (Tdap, Td) vaccine. You may need a Td booster every 10 years. Zoster vaccine. You may need this after age 68. Pneumococcal 13-valent conjugate (PCV13) vaccine. One dose is recommended after age 68. Pneumococcal polysaccharide (PPSV23) vaccine. One dose is recommended after age 68. Talk to your health care provider about which screenings and  vaccines you need and  how often you need them. This information is not intended to replace advice given to you by your health care provider. Make sure you discuss any questions you have with your health care provider. Document Released: 04/09/2015 Document Revised: 12/01/2015 Document Reviewed: 01/12/2015 Elsevier Interactive Patient Education  2017 [place] Prevention in the Home Falls can cause injuries. They can happen to people of all ages. There are many things you can do to make your home safe and to help prevent falls. What can I do on the outside of my home? Regularly fix the edges of walkways and driveways and fix any cracks. Remove anything that might make you trip as you walk through a door, such as a raised step or threshold. Trim any bushes or trees on the path to your home. Use bright outdoor lighting. Clear any walking paths of anything that might make someone trip, such as rocks or tools. Regularly check to see if handrails are loose or broken. Make sure that both sides of any steps have handrails. Any raised decks and porches should have guardrails on the edges. Have any leaves, snow, or ice cleared regularly. Use sand or salt on walking paths during winter. Clean up any spills in your garage right away. This includes oil or grease spills. What can I do in the bathroom? Use night lights. Install grab bars by the toilet and in the tub and shower. Do not use towel bars as grab bars. Use non-skid mats or decals in the tub or shower. If you need to sit down in the shower, use a plastic, non-slip stool. Keep the floor dry. Clean up any water that spills on the floor as soon as it happens. Remove soap buildup in the tub or shower regularly. Attach bath mats securely with double-sided non-slip rug tape. Do not have throw rugs and other things on the floor that can make you trip. What can I do in the bedroom? Use night lights. Make sure that you have a light by your bed that is easy to  reach. Do not use any sheets or blankets that are too big for your bed. They should not hang down onto the floor. Have a firm chair that has side arms. You can use this for support while you get dressed. Do not have throw rugs and other things on the floor that can make you trip. What can I do in the kitchen? Clean up any spills right away. Avoid walking on wet floors. Keep items that you use a lot in easy-to-reach places. If you need to reach something above you, use a strong step stool that has a grab bar. Keep electrical cords out of the way. Do not use floor polish or wax that makes floors slippery. If you must use wax, use non-skid floor wax. Do not have throw rugs and other things on the floor that can make you trip. What can I do with my stairs? Do not leave any items on the stairs. Make sure that there are handrails on both sides of the stairs and use them. Fix handrails that are broken or loose. Make sure that handrails are as long as the stairways. Check any carpeting to make sure that it is firmly attached to the stairs. Fix any carpet that is loose or worn. Avoid having throw rugs at the top or bottom of the stairs. If you do have throw rugs, attach them to the floor with carpet tape. Make  sure that you have a light switch at the top of the stairs and the bottom of the stairs. If you do not have them, ask someone to add them for you. What else can I do to help prevent falls? Wear shoes that: Do not have high heels. Have rubber bottoms. Are comfortable and fit you well. Are closed at the toe. Do not wear sandals. If you use a stepladder: Make sure that it is fully opened. Do not climb a closed stepladder. Make sure that both sides of the stepladder are locked into place. Ask someone to hold it for you, if possible. Clearly mark and make sure that you can see: Any grab bars or handrails. First and last steps. Where the edge of each step is. Use tools that help you move  around (mobility aids) if they are needed. These include: Canes. Walkers. Scooters. Crutches. Turn on the lights when you go into a dark area. Replace any light bulbs as soon as they burn out. Set up your furniture so you have a clear path. Avoid moving your furniture around. If any of your floors are uneven, fix them. If there are any pets around you, be aware of where they are. Review your medicines with your doctor. Some medicines can make you feel dizzy. This can increase your chance of falling. Ask your doctor what other things that you can do to help prevent falls. This information is not intended to replace advice given to you by your health care provider. Make sure you discuss any questions you have with your health care provider. Document Released: 01/07/2009 Document Revised: 08/19/2015 Document Reviewed: 04/17/2014 Elsevier Interactive Patient Education  2017 [employer].

## 2020-09-20 ENCOUNTER — Other Ambulatory Visit: Payer: Self-pay | Admitting: Family Medicine

## 2020-09-20 MED ORDER — HYDROCHLOROTHIAZIDE 25 MG PO TABS: 25.0000 mg | ORAL_TABLET | Freq: Every day | ORAL | 0 refills | Status: DC

## 2020-09-23 ENCOUNTER — Other Ambulatory Visit: Payer: Self-pay | Admitting: Family Medicine

## 2020-09-23 ENCOUNTER — Encounter: Payer: Self-pay | Admitting: Family Medicine

## 2020-09-23 DIAGNOSIS — I10 Essential (primary) hypertension: Secondary | ICD-10-CM

## 2020-09-23 DIAGNOSIS — G894 Chronic pain syndrome: Secondary | ICD-10-CM

## 2020-09-23 MED ORDER — MORPHINE SULFATE ER 30 MG PO TBCR: EXTENDED_RELEASE_TABLET | ORAL | 0 refills | Status: DC

## 2020-09-23 MED ORDER — OXYCODONE HCL 10 MG PO TABS: ORAL_TABLET | ORAL | 0 refills | Status: DC

## 2020-09-23 NOTE — Telephone Encounter (Signed)
Requesting: morphine Contract:10/27/19 UDS:07/16/19 Last Visit:05/05/20 Next Visit: advised to f/u 3 mo Last Refill:09/06/20(45,0)  Requesting: oxycodone Contract:10/27/19 UDS:07/16/19 Last Visit:05/05/20 Next Visit: advised to f/u 3 mo Last Refill: 09/06/20(45,0)  Please Advise. Medications pending

## 2020-09-23 NOTE — Telephone Encounter (Signed)
Will do 3 wk supply. This is pt's 2nd warning that he needed the appropriate 3 mo interval f/u. If he continues to do this I can no longer rx him these meds.

## 2020-09-23 NOTE — Telephone Encounter (Signed)
Spoke with pt regarding f/u appt, offered to schedule and was agreeable. Pt has f/u on 7/15

## 2020-09-23 NOTE — Telephone Encounter (Signed)
Spoke with pt regarding f/u appt, offered to schedule and was agreeable. Pt has f/u on 7/15 3 wk supply given for morphine and oxycodone rx

## 2020-10-01 ENCOUNTER — Emergency Department (HOSPITAL_COMMUNITY): Payer: PPO

## 2020-10-01 ENCOUNTER — Encounter (HOSPITAL_COMMUNITY): Payer: Self-pay | Admitting: Emergency Medicine

## 2020-10-01 ENCOUNTER — Emergency Department (HOSPITAL_COMMUNITY)
Admission: EM | Admit: 2020-10-01 | Discharge: 2020-10-01 | Disposition: A | Discharge status: Home / Self Care | Payer: PPO | Attending: Emergency Medicine | Admitting: Emergency Medicine

## 2020-10-01 ENCOUNTER — Other Ambulatory Visit: Payer: Self-pay

## 2020-10-01 DIAGNOSIS — Z79899 Other long term (current) drug therapy: Secondary | ICD-10-CM | POA: Insufficient documentation

## 2020-10-01 DIAGNOSIS — E119 Type 2 diabetes mellitus without complications: Secondary | ICD-10-CM | POA: Insufficient documentation

## 2020-10-01 DIAGNOSIS — S91111A Laceration without foreign body of right great toe without damage to nail, initial encounter: Secondary | ICD-10-CM

## 2020-10-01 DIAGNOSIS — Z7982 Long term (current) use of aspirin: Secondary | ICD-10-CM | POA: Diagnosis not present

## 2020-10-01 DIAGNOSIS — Z96652 Presence of left artificial knee joint: Secondary | ICD-10-CM | POA: Diagnosis not present

## 2020-10-01 DIAGNOSIS — N182 Chronic kidney disease, stage 2 (mild): Secondary | ICD-10-CM | POA: Diagnosis not present

## 2020-10-01 DIAGNOSIS — Y9301 Activity, walking, marching and hiking: Secondary | ICD-10-CM | POA: Insufficient documentation

## 2020-10-01 DIAGNOSIS — I129 Hypertensive chronic kidney disease with stage 1 through stage 4 chronic kidney disease, or unspecified chronic kidney disease: Secondary | ICD-10-CM | POA: Insufficient documentation

## 2020-10-01 DIAGNOSIS — W231XXA Caught, crushed, jammed, or pinched between stationary objects, initial encounter: Secondary | ICD-10-CM | POA: Diagnosis not present

## 2020-10-01 DIAGNOSIS — S99921A Unspecified injury of right foot, initial encounter: Secondary | ICD-10-CM | POA: Diagnosis present

## 2020-10-01 MED ORDER — OXYCODONE-ACETAMINOPHEN 5-325 MG PO TABS: 1.0000 | ORAL_TABLET | Freq: Once | ORAL | Status: DC

## 2020-10-01 MED ORDER — CEPHALEXIN 500 MG PO CAPS: 500.0000 mg | ORAL_CAPSULE | Freq: Two times a day (BID) | ORAL | 0 refills | Status: DC

## 2020-10-01 MED ORDER — LIDOCAINE HCL (PF) 1 % IJ SOLN: 5.0000 mL | Freq: Once | INTRAMUSCULAR | Status: AC

## 2020-10-01 MED ORDER — LIDOCAINE HCL (PF) 1 % IJ SOLN
INTRAMUSCULAR | Status: AC
  Administered 2020-10-01: 5 mL
  Filled 2020-10-01: qty 5

## 2020-10-01 MED ORDER — OXYCODONE-ACETAMINOPHEN 5-325 MG PO TABS
2.0000 | ORAL_TABLET | Freq: Once | ORAL | Status: AC
  Administered 2020-10-01: 2 via ORAL
  Filled 2020-10-01: qty 2

## 2020-10-01 NOTE — ED Notes (Signed)
telfa applied and kerlix applied

## 2020-10-01 NOTE — ED Triage Notes (Signed)
Pt c/o right great toe laceration.

## 2020-10-01 NOTE — Discharge Instructions (Addendum)
You have a laceration of your right great toe, you have received 9 sutures, please leave the wound dry for the first 24 hours.  After that I would like you to rinse out the wound 2 times daily and change out the dressings.  Starting you on antibiotics please take as prescribed.  I am also placed in a postop shoe please wear during the day you may take off at nighttime.  Please come back to this department or your PCP in the next 7 to 10 days to have your sutures removed.  Come back to the emergency department if you develop chest pain, shortness of breath, severe abdominal pain, uncontrolled nausea, vomiting, diarrhea.
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
## 2020-10-01 NOTE — ED Notes (Signed)
William PA in room at this time

## 2020-10-04 ENCOUNTER — Other Ambulatory Visit: Payer: Self-pay | Admitting: Family Medicine

## 2020-10-05 MED ORDER — METOPROLOL SUCCINATE ER 50 MG PO TB24: 50.0000 mg | ORAL_TABLET | Freq: Every day | ORAL | 0 refills | Status: DC

## 2020-10-07 ENCOUNTER — Other Ambulatory Visit: Payer: Self-pay

## 2020-10-07 ENCOUNTER — Encounter: Payer: Self-pay | Admitting: Family Medicine

## 2020-10-07 NOTE — Telephone Encounter (Signed)
yes

## 2020-10-07 NOTE — Telephone Encounter (Signed)
Please advise, thanks.

## 2020-10-08 ENCOUNTER — Other Ambulatory Visit: Payer: Self-pay

## 2020-10-08 ENCOUNTER — Ambulatory Visit (INDEPENDENT_AMBULATORY_CARE_PROVIDER_SITE_OTHER): Payer: PPO | Admitting: Family Medicine

## 2020-10-08 ENCOUNTER — Encounter: Payer: Self-pay | Admitting: Family Medicine

## 2020-10-08 VITALS — BP 136/71 | HR 96 | Temp 98.4°F | Resp 16 | Ht 68.0 in | Wt 296.6 lb

## 2020-10-08 DIAGNOSIS — I10 Essential (primary) hypertension: Secondary | ICD-10-CM | POA: Diagnosis not present

## 2020-10-08 DIAGNOSIS — M170 Bilateral primary osteoarthritis of knee: Secondary | ICD-10-CM | POA: Diagnosis not present

## 2020-10-08 DIAGNOSIS — N183 Chronic kidney disease, stage 3 unspecified: Secondary | ICD-10-CM | POA: Diagnosis not present

## 2020-10-08 DIAGNOSIS — M545 Low back pain, unspecified: Secondary | ICD-10-CM

## 2020-10-08 DIAGNOSIS — G894 Chronic pain syndrome: Secondary | ICD-10-CM

## 2020-10-08 DIAGNOSIS — E119 Type 2 diabetes mellitus without complications: Secondary | ICD-10-CM | POA: Diagnosis not present

## 2020-10-08 DIAGNOSIS — Z4802 Encounter for removal of sutures: Secondary | ICD-10-CM

## 2020-10-08 DIAGNOSIS — Z79899 Other long term (current) drug therapy: Secondary | ICD-10-CM | POA: Diagnosis not present

## 2020-10-08 DIAGNOSIS — G8929 Other chronic pain: Secondary | ICD-10-CM | POA: Diagnosis not present

## 2020-10-08 MED ORDER — OXYCODONE HCL 10 MG PO TABS: ORAL_TABLET | ORAL | 0 refills | Status: DC

## 2020-10-08 MED ORDER — MORPHINE SULFATE ER 30 MG PO TBCR: EXTENDED_RELEASE_TABLET | ORAL | 0 refills | Status: DC
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
## 2020-10-08 NOTE — Addendum Note (Signed)
Addended by: Octaviano Glow on: 10/08/2020 03:27 PM   Modules accepted: Orders

## 2020-10-09 LAB — HEMOGLOBIN A1C
Hgb A1c MFr Bld: 6.4 % of total Hgb — ABNORMAL HIGH (ref <5.7)
Mean Plasma Glucose: 137 mg/dL
eAG (mmol/L): 7.6 mmol/L

## 2020-10-09 LAB — COMPREHENSIVE METABOLIC PANEL
AG Ratio: 1.3 (calc) (ref 1.0–2.5)
ALT: 18 U/L (ref 9–46)
AST: 15 U/L (ref 10–35)
Albumin: 3.9 g/dL (ref 3.6–5.1)
Alkaline phosphatase (APISO): 47 U/L (ref 35–144)
BUN: 17 mg/dL (ref 7–25)
CO2: 27 mmol/L (ref 20–32)
Calcium: 9.1 mg/dL (ref 8.6–10.3)
Chloride: 95 mmol/L — ABNORMAL LOW (ref 98–110)
Creat: 1.09 mg/dL (ref 0.70–1.35)
Globulin: 2.9 g/dL (calc) (ref 1.9–3.7)
Glucose, Bld: 109 mg/dL — ABNORMAL HIGH (ref 65–99)
Potassium: 3.7 mmol/L (ref 3.5–5.3)
Sodium: 133 mmol/L — ABNORMAL LOW (ref 135–146)
Total Bilirubin: 0.5 mg/dL (ref 0.2–1.2)
Total Protein: 6.8 g/dL (ref 6.1–8.1)

## 2020-10-12 LAB — DRUG MONITORING, PANEL 8 WITH CONFIRMATION, URINE
6 Acetylmorphine: NEGATIVE ng/mL (ref <10)
Alcohol Metabolites: NEGATIVE ng/mL (ref <500)
Alphahydroxyalprazolam: NEGATIVE ng/mL (ref <25)
Alphahydroxymidazolam: NEGATIVE ng/mL (ref <50)
Alphahydroxytriazolam: NEGATIVE ng/mL (ref <50)
Aminoclonazepam: 251 ng/mL — ABNORMAL HIGH (ref <25)
Amphetamines: NEGATIVE ng/mL (ref <500)
Benzodiazepines: POSITIVE ng/mL — AB (ref <100)
Buprenorphine, Urine: NEGATIVE ng/mL (ref <5)
Cocaine Metabolite: NEGATIVE ng/mL (ref <150)
Codeine: NEGATIVE ng/mL (ref <50)
Creatinine: 102.5 mg/dL (ref >=20.0)
Hydrocodone: NEGATIVE ng/mL (ref <50)
Hydromorphone: 129 ng/mL — ABNORMAL HIGH (ref <50)
Hydroxyethylflurazepam: NEGATIVE ng/mL (ref <50)
Lorazepam: NEGATIVE ng/mL (ref <50)
MDMA: NEGATIVE ng/mL (ref <500)
Marijuana Metabolite: NEGATIVE ng/mL (ref <20)
Morphine: >10000 ng/mL — ABNORMAL HIGH (ref <50)
Nordiazepam: NEGATIVE ng/mL (ref <50)
Norhydrocodone: NEGATIVE ng/mL (ref <50)
Noroxycodone: 2749 ng/mL — ABNORMAL HIGH (ref <50)
Opiates: POSITIVE ng/mL — AB (ref <100)
Oxazepam: NEGATIVE ng/mL (ref <50)
Oxidant: NEGATIVE ug/mL (ref <200)
Oxycodone: 3287 ng/mL — ABNORMAL HIGH (ref <50)
Oxycodone: POSITIVE ng/mL — AB (ref <100)
Oxymorphone: 299 ng/mL — ABNORMAL HIGH (ref <50)
Temazepam: NEGATIVE ng/mL (ref <50)
pH: 5.4 (ref 4.5–9.0)

## 2020-10-12 LAB — DM TEMPLATE

## 2020-10-20 ENCOUNTER — Other Ambulatory Visit: Payer: Self-pay | Admitting: Family Medicine

## 2020-10-20 MED ORDER — LISINOPRIL 10 MG PO TABS: 10.0000 mg | ORAL_TABLET | Freq: Every day | ORAL | 0 refills | Status: DC

## 2020-10-20 MED ORDER — HYDROCHLOROTHIAZIDE 25 MG PO TABS: 25.0000 mg | ORAL_TABLET | Freq: Every day | ORAL | 0 refills | Status: DC

## 2021-01-08 ENCOUNTER — Other Ambulatory Visit: Payer: Self-pay | Admitting: Family Medicine

## 2021-01-11 ENCOUNTER — Other Ambulatory Visit: Payer: Self-pay

## 2021-01-11 ENCOUNTER — Telehealth: Payer: Self-pay

## 2021-01-11 ENCOUNTER — Other Ambulatory Visit: Payer: Self-pay | Admitting: Family Medicine

## 2021-01-11 MED ORDER — METOPROLOL SUCCINATE ER 50 MG PO TB24: 50.0000 mg | ORAL_TABLET | Freq: Every day | ORAL | 0 refills | Status: DC

## 2021-01-11 NOTE — Telephone Encounter (Signed)
Patient refill request. Scheduled follow up appt with Dr. Anitra Lauth on 01/21/21.  [facility]   metoprolol succinate (TOPROL-XL) 50 MG 24 hr tablet [[id number]]   lisinopril (ZESTRIL) 10 MG tablet [[id number]]   hydrochlorothiazide (HYDRODIURIL) 25 MG tablet [[id number]]

## 2021-01-11 NOTE — Telephone Encounter (Signed)
Pt advised refills should last until appt on hctz and lisinopril, last refill 10/20/20. Refill sent for metoprolol. Pt confirmed appt for 10/28

## 2021-01-13 ENCOUNTER — Other Ambulatory Visit: Payer: Self-pay | Admitting: Family Medicine

## 2021-01-13 DIAGNOSIS — G894 Chronic pain syndrome: Secondary | ICD-10-CM

## 2021-01-13 DIAGNOSIS — I10 Essential (primary) hypertension: Secondary | ICD-10-CM

## 2021-01-14 NOTE — Telephone Encounter (Signed)
Requesting: MORPHINE Contract: 10/08/20 UDS:10/08/20 Last Visit:10/08/20 Next Visit:01/21/21 Last Refill:10/08/20(90,0) electronic refill for Aug and Sept   Requesting: OXYCODONE Contract: 10/08/20 UDS: 10/08/20 Last Visit:10/08/20 Next Visit:01/21/21 Last Refill:10/08/20(90,0) electronic refill for Aug and Sept  Please Advise. Meds pending

## 2021-01-21 ENCOUNTER — Encounter: Payer: Self-pay | Admitting: Family Medicine

## 2021-01-21 ENCOUNTER — Ambulatory Visit (INDEPENDENT_AMBULATORY_CARE_PROVIDER_SITE_OTHER): Payer: PPO | Admitting: Family Medicine

## 2021-01-21 ENCOUNTER — Other Ambulatory Visit: Payer: Self-pay

## 2021-01-21 VITALS — BP 134/75 | HR 78 | Temp 98.8°F | Resp 16 | Ht 68.0 in | Wt 297.6 lb

## 2021-01-21 DIAGNOSIS — E119 Type 2 diabetes mellitus without complications: Secondary | ICD-10-CM | POA: Diagnosis not present

## 2021-01-21 DIAGNOSIS — G8929 Other chronic pain: Secondary | ICD-10-CM | POA: Diagnosis not present

## 2021-01-21 DIAGNOSIS — Z23 Encounter for immunization: Secondary | ICD-10-CM

## 2021-01-21 DIAGNOSIS — N183 Chronic kidney disease, stage 3 unspecified: Secondary | ICD-10-CM | POA: Diagnosis not present

## 2021-01-21 DIAGNOSIS — G894 Chronic pain syndrome: Secondary | ICD-10-CM

## 2021-01-21 DIAGNOSIS — M545 Low back pain, unspecified: Secondary | ICD-10-CM | POA: Diagnosis not present

## 2021-01-21 DIAGNOSIS — I10 Essential (primary) hypertension: Secondary | ICD-10-CM

## 2021-01-21 DIAGNOSIS — Z79899 Other long term (current) drug therapy: Secondary | ICD-10-CM

## 2021-01-21 DIAGNOSIS — Z0000 Encounter for general adult medical examination without abnormal findings: Secondary | ICD-10-CM

## 2021-01-21 DIAGNOSIS — M170 Bilateral primary osteoarthritis of knee: Secondary | ICD-10-CM | POA: Diagnosis not present

## 2021-01-21 DIAGNOSIS — Z125 Encounter for screening for malignant neoplasm of prostate: Secondary | ICD-10-CM | POA: Diagnosis not present

## 2021-01-21 LAB — COMPREHENSIVE METABOLIC PANEL
ALT: 25 U/L (ref 0–53)
AST: 19 U/L (ref 0–37)
Albumin: 4.3 g/dL (ref 3.5–5.2)
Alkaline Phosphatase: 53 U/L (ref 39–117)
BUN: 16 mg/dL (ref 6–23)
CO2: 36 mEq/L — ABNORMAL HIGH (ref 19–32)
Calcium: 9.7 mg/dL (ref 8.4–10.5)
Chloride: 93 mEq/L — ABNORMAL LOW (ref 96–112)
Creatinine, Ser: 1.23 mg/dL (ref 0.40–1.50)
GFR: 60.43 mL/min (ref >60.00)
Glucose, Bld: 97 mg/dL (ref 70–99)
Potassium: 4.7 mEq/L (ref 3.5–5.1)
Sodium: 138 mEq/L (ref 135–145)
Total Bilirubin: 0.7 mg/dL (ref 0.2–1.2)
Total Protein: 7.4 g/dL (ref 6.0–8.3)

## 2021-01-21 LAB — LIPID PANEL
Cholesterol: 215 mg/dL — ABNORMAL HIGH (ref 0–200)
HDL: 47.9 mg/dL (ref >39.00)
LDL Cholesterol: 136 mg/dL — ABNORMAL HIGH (ref 0–99)
NonHDL: 166.87
Total CHOL/HDL Ratio: 4
Triglycerides: 154 mg/dL — ABNORMAL HIGH (ref 0.0–149.0)
VLDL: 30.8 mg/dL (ref 0.0–40.0)

## 2021-01-21 LAB — CBC WITH DIFFERENTIAL/PLATELET
Basophils Absolute: 0 10*3/uL (ref 0.0–0.1)
Basophils Relative: 0.5 % (ref 0.0–3.0)
Eosinophils Absolute: 0.1 10*3/uL (ref 0.0–0.7)
Eosinophils Relative: 1.6 % (ref 0.0–5.0)
HCT: 45.4 % (ref 39.0–52.0)
Hemoglobin: 14.9 g/dL (ref 13.0–17.0)
Lymphocytes Relative: 14.1 % (ref 12.0–46.0)
Lymphs Abs: 1.3 10*3/uL (ref 0.7–4.0)
MCHC: 32.9 g/dL (ref 30.0–36.0)
MCV: 96.9 fl (ref 78.0–100.0)
Monocytes Absolute: 0.9 10*3/uL (ref 0.1–1.0)
Monocytes Relative: 9.5 % (ref 3.0–12.0)
Neutro Abs: 7 10*3/uL (ref 1.4–7.7)
Neutrophils Relative %: 74.3 % (ref 43.0–77.0)
Platelets: 239 10*3/uL (ref 150.0–400.0)
RBC: 4.68 Mil/uL (ref 4.22–5.81)
RDW: 13 % (ref 11.5–15.5)
WBC: 9.4 10*3/uL (ref 4.0–10.5)

## 2021-01-21 LAB — HEMOGLOBIN A1C: Hgb A1c MFr Bld: 6.6 % — ABNORMAL HIGH (ref 4.6–6.5)

## 2021-01-21 LAB — MICROALBUMIN / CREATININE URINE RATIO
Creatinine,U: 85 mg/dL
Microalb Creat Ratio: 5.7 mg/g (ref 0.0–30.0)
Microalb, Ur: 4.8 mg/dL — ABNORMAL HIGH (ref 0.0–1.9)

## 2021-01-21 LAB — PSA, MEDICARE: PSA: 0.26 ng/ml (ref 0.10–4.00)

## 2021-01-21 MED ORDER — CLONAZEPAM 0.5 MG PO TABS: 0.5000 mg | ORAL_TABLET | Freq: Two times a day (BID) | ORAL | 5 refills | Status: DC | PRN

## 2021-01-21 MED ORDER — MORPHINE SULFATE ER 30 MG PO TBCR
EXTENDED_RELEASE_TABLET | ORAL | 0 refills | Status: DC
Start: 2021-01-21 — End: 2021-01-21

## 2021-01-21 MED ORDER — OXYCODONE HCL 10 MG PO TABS: ORAL_TABLET | ORAL | 0 refills | Status: DC

## 2021-01-21 MED ORDER — LISINOPRIL 10 MG PO TABS: 10.0000 mg | ORAL_TABLET | Freq: Every day | ORAL | 3 refills | Status: DC

## 2021-01-21 MED ORDER — HYDROCHLOROTHIAZIDE 25 MG PO TABS: 25.0000 mg | ORAL_TABLET | Freq: Every day | ORAL | 3 refills | Status: DC

## 2021-01-21 MED ORDER — MORPHINE SULFATE ER 30 MG PO TBCR: EXTENDED_RELEASE_TABLET | ORAL | 0 refills | Status: DC

## 2021-01-21 NOTE — Patient Instructions (Signed)
Health Maintenance, Male Adopting a healthy lifestyle and getting preventive care are important in promoting health and wellness. Ask your health care provider about: The right schedule for you to have regular tests and exams. Things you can do on your own to prevent diseases and keep yourself healthy. What should I know about diet, weight, and exercise? Eat a healthy diet  Eat a diet that includes plenty of vegetables, fruits, low-fat dairy products, and lean protein. Do not eat a lot of foods that are high in solid fats, added sugars, or sodium. Maintain a healthy weight Body mass index (BMI) is a measurement that can be used to identify possible weight problems. It estimates body fat based on height and weight. Your health care provider can help determine your BMI and help you achieve or maintain a healthy weight. Get regular exercise Get regular exercise. This is one of the most important things you can do for your health. Most adults should: Exercise for at least 150 minutes each week. The exercise should increase your heart rate and make you sweat (moderate-intensity exercise). Do strengthening exercises at least twice a week. This is in addition to the moderate-intensity exercise. Spend less time sitting. Even light physical activity can be beneficial. Watch cholesterol and blood lipids Have your blood tested for lipids and cholesterol at 68 years of age, then have this test every 5 years. You may need to have your cholesterol levels checked more often if: Your lipid or cholesterol levels are high. You are older than 68 years of age. You are at high risk for heart disease. What should I know about cancer screening? Many types of cancers can be detected early and may often be prevented. Depending on your health history and family history, you may need to have cancer screening at various ages. This may include screening for: Colorectal cancer. Prostate cancer. Skin cancer. Lung  cancer. What should I know about heart disease, diabetes, and high blood pressure? Blood pressure and heart disease High blood pressure causes heart disease and increases the risk of stroke. This is more likely to develop in people who have high blood pressure readings, are of African descent, or are overweight. Talk with your health care provider about your target blood pressure readings. Have your blood pressure checked: Every 3-5 years if you are 18-39 years of age. Every year if you are 40 years old or older. If you are between the ages of 65 and 75 and are a current or former smoker, ask your health care provider if you should have a one-time screening for abdominal aortic aneurysm (AAA). Diabetes Have regular diabetes screenings. This checks your fasting blood sugar level. Have the screening done: Once every three years after age 45 if you are at a normal weight and have a low risk for diabetes. More often and at a younger age if you are overweight or have a high risk for diabetes. What should I know about preventing infection? Hepatitis B If you have a higher risk for hepatitis B, you should be screened for this virus. Talk with your health care provider to find out if you are at risk for hepatitis B infection. Hepatitis C Blood testing is recommended for: Everyone born from 1945 through 1965. Anyone with known risk factors for hepatitis C. Sexually transmitted infections (STIs) You should be screened each year for STIs, including gonorrhea and chlamydia, if: You are sexually active and are younger than 68 years of age. You are older than 68 years   of age and your health care provider tells you that you are at risk for this type of infection. Your sexual activity has changed since you were last screened, and you are at increased risk for chlamydia or gonorrhea. Ask your health care provider if you are at risk. Ask your health care provider about whether you are at high risk for HIV.  Your health care provider may recommend a prescription medicine to help prevent HIV infection. If you choose to take medicine to prevent HIV, you should first get tested for HIV. You should then be tested every 3 months for as long as you are taking the medicine. Follow these instructions at home: Lifestyle Do not use any products that contain nicotine or tobacco, such as cigarettes, e-cigarettes, and chewing tobacco. If you need help quitting, ask your health care provider. Do not use street drugs. Do not share needles. Ask your health care provider for help if you need support or information about quitting drugs. Alcohol use Do not drink alcohol if your health care provider tells you not to drink. If you drink alcohol: Limit how much you have to 0-2 drinks a day. Be aware of how much alcohol is in your drink. In the [place] one drink equals one 12 oz bottle of beer (355 mL), one 5 oz glass of wine (148 mL), or one 1 oz glass of hard liquor (44 mL). General instructions Schedule regular health, dental, and eye exams. Stay current with your vaccines. Tell your health care provider if: You often feel depressed. You have ever been abused or do not feel safe at home. Summary Adopting a healthy lifestyle and getting preventive care are important in promoting health and wellness. Follow your health care provider's instructions about healthy diet, exercising, and getting tested or screened for diseases. Follow your health care provider's instructions on monitoring your cholesterol and blood pressure. This information is not intended to replace advice given to you by your health care provider. Make sure you discuss any questions you have with your health care provider. Document Revised: 05/21/2020 Document Reviewed: 03/06/2018 Elsevier Patient Education  2022 [employer]  
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
## 2021-01-24 NOTE — Telephone Encounter (Signed)
Previous message has already been forwarded to provider for review and recommendations. See other mychart encounter

## 2021-01-24 NOTE — Progress Notes (Signed)
OK to hold off until and we'll re-evaluate at next f/u in 3 mo.

## 2021-03-15 LAB — HM DIABETES EYE EXAM

## 2021-03-22 ENCOUNTER — Encounter (INDEPENDENT_AMBULATORY_CARE_PROVIDER_SITE_OTHER): Payer: Self-pay | Admitting: *Deleted

## 2021-04-13 ENCOUNTER — Other Ambulatory Visit: Payer: Self-pay | Admitting: Family Medicine

## 2021-04-28 DIAGNOSIS — Z96652 Presence of left artificial knee joint: Secondary | ICD-10-CM | POA: Diagnosis not present

## 2021-04-28 DIAGNOSIS — M170 Bilateral primary osteoarthritis of knee: Secondary | ICD-10-CM | POA: Diagnosis not present

## 2021-04-28 DIAGNOSIS — G894 Chronic pain syndrome: Secondary | ICD-10-CM | POA: Diagnosis not present

## 2021-04-28 DIAGNOSIS — R600 Localized edema: Secondary | ICD-10-CM | POA: Diagnosis not present

## 2021-04-28 DIAGNOSIS — N1831 Chronic kidney disease, stage 3a: Secondary | ICD-10-CM | POA: Diagnosis not present

## 2021-04-28 DIAGNOSIS — F411 Generalized anxiety disorder: Secondary | ICD-10-CM | POA: Diagnosis not present

## 2021-04-28 DIAGNOSIS — F330 Major depressive disorder, recurrent, mild: Secondary | ICD-10-CM | POA: Diagnosis not present

## 2021-04-28 DIAGNOSIS — G8929 Other chronic pain: Secondary | ICD-10-CM | POA: Diagnosis not present

## 2021-04-28 DIAGNOSIS — E1122 Type 2 diabetes mellitus with diabetic chronic kidney disease: Secondary | ICD-10-CM | POA: Diagnosis not present

## 2021-04-28 DIAGNOSIS — M545 Low back pain, unspecified: Secondary | ICD-10-CM | POA: Diagnosis not present

## 2021-04-28 DIAGNOSIS — I129 Hypertensive chronic kidney disease with stage 1 through stage 4 chronic kidney disease, or unspecified chronic kidney disease: Secondary | ICD-10-CM | POA: Diagnosis not present

## 2021-04-29 ENCOUNTER — Telehealth: Payer: Self-pay | Admitting: Family Medicine

## 2021-04-29 NOTE — Telephone Encounter (Signed)
Needs in person appt.

## 2021-04-29 NOTE — Telephone Encounter (Signed)
[place] Corene Cornea) cell: [phone]  Called about pt having both legs swelling & had gained 10 pounds since last visit.  Wanting to consult with provider about patient.-KR

## 2021-04-29 NOTE — Telephone Encounter (Signed)
Tried contacting [place] back regarding pt. Unable to LVM. Spoke with pt and advised OV needed. Pt scheduled for Monday at 10am

## 2021-04-29 NOTE — Telephone Encounter (Signed)
Spoke with Richard Levine regarding pt, he called in 5 day course of Lasix 40mg . Pt has not started this medication yet. Richard Levine wanted to consult with PCP first. Pt does not have any scheduled f/u appointments yet.  Please review and advise

## 2021-05-02 ENCOUNTER — Encounter: Payer: Self-pay | Admitting: Family Medicine

## 2021-05-02 ENCOUNTER — Ambulatory Visit (INDEPENDENT_AMBULATORY_CARE_PROVIDER_SITE_OTHER): Payer: PPO | Admitting: Family Medicine

## 2021-05-02 ENCOUNTER — Other Ambulatory Visit: Payer: Self-pay

## 2021-05-02 VITALS — BP 133/79 | HR 89 | Temp 98.3°F | Ht 68.0 in | Wt 310.2 lb

## 2021-05-02 DIAGNOSIS — F4310 Post-traumatic stress disorder, unspecified: Secondary | ICD-10-CM | POA: Diagnosis not present

## 2021-05-02 DIAGNOSIS — G894 Chronic pain syndrome: Secondary | ICD-10-CM | POA: Diagnosis not present

## 2021-05-02 DIAGNOSIS — I10 Essential (primary) hypertension: Secondary | ICD-10-CM

## 2021-05-02 DIAGNOSIS — F419 Anxiety disorder, unspecified: Secondary | ICD-10-CM

## 2021-05-02 DIAGNOSIS — F331 Major depressive disorder, recurrent, moderate: Secondary | ICD-10-CM | POA: Diagnosis not present

## 2021-05-02 DIAGNOSIS — Z79899 Other long term (current) drug therapy: Secondary | ICD-10-CM

## 2021-05-02 DIAGNOSIS — R600 Localized edema: Secondary | ICD-10-CM | POA: Diagnosis not present

## 2021-05-02 DIAGNOSIS — R0609 Other forms of dyspnea: Secondary | ICD-10-CM

## 2021-05-02 DIAGNOSIS — E119 Type 2 diabetes mellitus without complications: Secondary | ICD-10-CM | POA: Diagnosis not present

## 2021-05-02 LAB — BASIC METABOLIC PANEL
BUN: 24 mg/dL — ABNORMAL HIGH (ref 6–23)
CO2: 32 mEq/L (ref 19–32)
Calcium: 9.9 mg/dL (ref 8.4–10.5)
Chloride: 92 mEq/L — ABNORMAL LOW (ref 96–112)
Creatinine, Ser: 1.19 mg/dL (ref 0.40–1.50)
GFR: 62.76 mL/min (ref >60.00)
Glucose, Bld: 120 mg/dL — ABNORMAL HIGH (ref 70–99)
Potassium: 4.3 mEq/L (ref 3.5–5.1)
Sodium: 134 mEq/L — ABNORMAL LOW (ref 135–145)

## 2021-05-02 LAB — HEMOGLOBIN A1C: Hgb A1c MFr Bld: 6.8 % — ABNORMAL HIGH (ref 4.6–6.5)

## 2021-05-02 MED ORDER — FUROSEMIDE 40 MG PO TABS: 40.0000 mg | ORAL_TABLET | Freq: Every day | ORAL | 0 refills | Status: DC

## 2021-05-02 MED ORDER — ESCITALOPRAM OXALATE 10 MG PO TABS: 10.0000 mg | ORAL_TABLET | Freq: Every day | ORAL | 0 refills | Status: DC
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
## 2021-05-02 NOTE — Patient Instructions (Signed)
[facility] [address] [place],  [place]  [address] Main: [phone]  

## 2021-05-03 DIAGNOSIS — N1831 Chronic kidney disease, stage 3a: Secondary | ICD-10-CM | POA: Diagnosis not present

## 2021-05-03 DIAGNOSIS — F411 Generalized anxiety disorder: Secondary | ICD-10-CM | POA: Diagnosis not present

## 2021-05-03 DIAGNOSIS — E1122 Type 2 diabetes mellitus with diabetic chronic kidney disease: Secondary | ICD-10-CM | POA: Diagnosis not present

## 2021-05-03 DIAGNOSIS — Z6841 Body Mass Index (BMI) 40.0 and over, adult: Secondary | ICD-10-CM | POA: Diagnosis not present

## 2021-05-03 DIAGNOSIS — F330 Major depressive disorder, recurrent, mild: Secondary | ICD-10-CM | POA: Diagnosis not present

## 2021-05-03 DIAGNOSIS — R600 Localized edema: Secondary | ICD-10-CM | POA: Diagnosis not present

## 2021-05-09 ENCOUNTER — Telehealth: Payer: Self-pay

## 2021-05-09 DIAGNOSIS — F4310 Post-traumatic stress disorder, unspecified: Secondary | ICD-10-CM | POA: Diagnosis not present

## 2021-05-09 DIAGNOSIS — F411 Generalized anxiety disorder: Secondary | ICD-10-CM | POA: Diagnosis not present

## 2021-05-09 DIAGNOSIS — F330 Major depressive disorder, recurrent, mild: Secondary | ICD-10-CM | POA: Diagnosis not present

## 2021-05-09 NOTE — Telephone Encounter (Signed)
Patient was seen by Dr. Anitra Lauth 05/02/21. He was to follow up 7-10 days.  Dr. Anitra Lauth states that Dr. Anitra Lauth ordered ECHO for patient.   No one has called patient to schedule ECHO at [facility] location. -  (1)   Was patient to get ECHO prior to coming back for follow up visit?   Patient is scheduled for follow up with PCP on Friday, 2/17. (2)  If so, I have copied in Sherri to check on status of referral to [facility] for ECHO.  We may have to adjust patient's follow up appt once determined the above.  Thank you!

## 2021-05-09 NOTE — Telephone Encounter (Signed)
Left a message for Pt to inform him that 1st avabl Appt at [facility] is Friday 05/13/21 at 2:00pm.   I also provided him with the call back back to call scheduling it he wanted to reschedule his appt [phone].

## 2021-05-09 NOTE — Telephone Encounter (Signed)
noted 

## 2021-05-09 NOTE — Telephone Encounter (Signed)
Pt is scheduled for 2 pm for ECHO

## 2021-05-10 ENCOUNTER — Encounter: Payer: Self-pay | Admitting: Family Medicine

## 2021-05-13 ENCOUNTER — Ambulatory Visit (INDEPENDENT_AMBULATORY_CARE_PROVIDER_SITE_OTHER): Payer: PPO | Admitting: Family Medicine

## 2021-05-13 ENCOUNTER — Other Ambulatory Visit: Payer: Self-pay

## 2021-05-13 ENCOUNTER — Ambulatory Visit (HOSPITAL_COMMUNITY)
Admission: RE | Admit: 2021-05-13 | Discharge: 2021-05-13 | Disposition: A | Discharge status: Home / Self Care | Payer: PPO | Source: Ambulatory Visit | Attending: Family Medicine | Admitting: Family Medicine

## 2021-05-13 ENCOUNTER — Encounter: Payer: Self-pay | Admitting: Family Medicine

## 2021-05-13 VITALS — BP 120/67 | HR 84 | Temp 98.4°F | Ht 68.0 in | Wt 296.8 lb

## 2021-05-13 DIAGNOSIS — F410 Panic disorder [episodic paroxysmal anxiety] without agoraphobia: Secondary | ICD-10-CM

## 2021-05-13 DIAGNOSIS — R600 Localized edema: Secondary | ICD-10-CM | POA: Insufficient documentation

## 2021-05-13 DIAGNOSIS — R0609 Other forms of dyspnea: Secondary | ICD-10-CM | POA: Diagnosis not present

## 2021-05-13 DIAGNOSIS — F411 Generalized anxiety disorder: Secondary | ICD-10-CM | POA: Diagnosis not present

## 2021-05-13 DIAGNOSIS — K760 Fatty (change of) liver, not elsewhere classified: Secondary | ICD-10-CM

## 2021-05-13 HISTORY — PX: TRANSTHORACIC ECHOCARDIOGRAM: SHX275

## 2021-05-13 LAB — ECHOCARDIOGRAM COMPLETE
Area-P 1/2: 2.69 cm2
S' Lateral: 3.2 cm

## 2021-05-13 MED ORDER — METOPROLOL SUCCINATE ER 50 MG PO TB24: 50.0000 mg | ORAL_TABLET | Freq: Every day | ORAL | 3 refills | Status: DC

## 2021-05-13 NOTE — Progress Notes (Signed)
*  PRELIMINARY RESULTS* Echocardiogram 2D Echocardiogram has been performed.  Richard Levine 05/13/2021, 2:50 PM
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
## 2021-05-13 NOTE — Patient Instructions (Signed)
Stop lasix (furosemide) at this time.  Continue all other meds as-is.

## 2021-05-14 LAB — BASIC METABOLIC PANEL
BUN/Creatinine Ratio: 19 (calc) (ref 6–22)
BUN: 27 mg/dL — ABNORMAL HIGH (ref 7–25)
CO2: 32 mmol/L (ref 20–32)
Calcium: 10 mg/dL (ref 8.6–10.3)
Chloride: 92 mmol/L — ABNORMAL LOW (ref 98–110)
Creat: 1.45 mg/dL — ABNORMAL HIGH (ref 0.70–1.35)
Glucose, Bld: 96 mg/dL (ref 65–99)
Potassium: 4.5 mmol/L (ref 3.5–5.3)
Sodium: 137 mmol/L (ref 135–146)

## 2021-05-16 ENCOUNTER — Other Ambulatory Visit: Payer: Self-pay | Admitting: Family Medicine

## 2021-05-16 DIAGNOSIS — G894 Chronic pain syndrome: Secondary | ICD-10-CM

## 2021-05-16 DIAGNOSIS — I10 Essential (primary) hypertension: Secondary | ICD-10-CM

## 2021-05-16 NOTE — Telephone Encounter (Signed)
Requesting: morphine 30mg  & oxycodone 10mg  Contract: 10/08/20 UDS: 10/08/20 Last Visit: 05/13/20 Next Visit: 06/06/20 Last Refill: 01/21/21(90,0)  Please Advise. Meds pending

## 2021-05-23 ENCOUNTER — Ambulatory Visit (INDEPENDENT_AMBULATORY_CARE_PROVIDER_SITE_OTHER): Payer: PPO

## 2021-05-23 ENCOUNTER — Other Ambulatory Visit: Payer: Self-pay

## 2021-05-23 ENCOUNTER — Encounter: Payer: Self-pay | Admitting: Family Medicine

## 2021-05-23 DIAGNOSIS — R7989 Other specified abnormal findings of blood chemistry: Secondary | ICD-10-CM

## 2021-05-23 LAB — BASIC METABOLIC PANEL
BUN: 21 mg/dL (ref 6–23)
CO2: 34 mEq/L — ABNORMAL HIGH (ref 19–32)
Calcium: 9.4 mg/dL (ref 8.4–10.5)
Chloride: 95 mEq/L — ABNORMAL LOW (ref 96–112)
Creatinine, Ser: 1.16 mg/dL (ref 0.40–1.50)
GFR: 64.68 mL/min (ref >60.00)
Glucose, Bld: 116 mg/dL — ABNORMAL HIGH (ref 70–99)
Potassium: 4.3 mEq/L (ref 3.5–5.1)
Sodium: 137 mEq/L (ref 135–145)

## 2021-05-24 NOTE — Telephone Encounter (Signed)
Provider was verbally made aware of pt's message and ok given for pt to reschedule [place].

## 2021-05-25 ENCOUNTER — Other Ambulatory Visit: Payer: Self-pay | Admitting: Family Medicine

## 2021-05-26 ENCOUNTER — Other Ambulatory Visit: Payer: Self-pay | Admitting: Family Medicine

## 2021-05-26 ENCOUNTER — Other Ambulatory Visit: Payer: Self-pay

## 2021-05-26 MED ORDER — ESCITALOPRAM OXALATE 10 MG PO TABS: 10.0000 mg | ORAL_TABLET | Freq: Every day | ORAL | 0 refills | Status: DC

## 2021-05-30 ENCOUNTER — Ambulatory Visit (HOSPITAL_COMMUNITY): Payer: PPO

## 2021-06-06 ENCOUNTER — Encounter: Payer: Self-pay | Admitting: Family Medicine

## 2021-06-06 ENCOUNTER — Other Ambulatory Visit: Payer: Self-pay

## 2021-06-06 ENCOUNTER — Ambulatory Visit (INDEPENDENT_AMBULATORY_CARE_PROVIDER_SITE_OTHER): Payer: PPO | Admitting: Family Medicine

## 2021-06-06 VITALS — BP 115/73 | HR 97 | Temp 98.2°F | Ht 68.0 in | Wt 297.4 lb

## 2021-06-06 DIAGNOSIS — R600 Localized edema: Secondary | ICD-10-CM | POA: Diagnosis not present

## 2021-06-06 DIAGNOSIS — F411 Generalized anxiety disorder: Secondary | ICD-10-CM

## 2021-06-06 DIAGNOSIS — F39 Unspecified mood [affective] disorder: Secondary | ICD-10-CM | POA: Diagnosis not present

## 2021-06-06 MED ORDER — CLONAZEPAM 0.5 MG PO TABS: 0.5000 mg | ORAL_TABLET | Freq: Two times a day (BID) | ORAL | 5 refills | Status: DC | PRN

## 2021-06-06 MED ORDER — ESCITALOPRAM OXALATE 10 MG PO TABS: 10.0000 mg | ORAL_TABLET | Freq: Every day | ORAL | 1 refills | Status: DC
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
## 2021-06-08 ENCOUNTER — Ambulatory Visit (HOSPITAL_COMMUNITY): Payer: PPO

## 2021-06-13 NOTE — Telephone Encounter (Signed)
ERROR

## 2021-06-14 ENCOUNTER — Other Ambulatory Visit: Payer: Self-pay | Admitting: Family Medicine

## 2021-06-14 ENCOUNTER — Encounter: Payer: Self-pay | Admitting: Family Medicine

## 2021-06-14 DIAGNOSIS — I10 Essential (primary) hypertension: Secondary | ICD-10-CM

## 2021-06-14 DIAGNOSIS — G894 Chronic pain syndrome: Secondary | ICD-10-CM

## 2021-06-14 MED ORDER — OXYCODONE HCL 10 MG PO TABS
ORAL_TABLET | ORAL | 0 refills | Status: DC
Start: 2021-06-14 — End: 2021-07-15

## 2021-06-14 MED ORDER — MORPHINE SULFATE ER 30 MG PO TBCR: EXTENDED_RELEASE_TABLET | ORAL | 0 refills | Status: DC

## 2021-06-14 NOTE — Telephone Encounter (Signed)
Duplicate request. Please deny

## 2021-06-14 NOTE — Telephone Encounter (Signed)
Requesting:morphine ?Contract: 02/08/21 ?UDS: 02/08/21 ?Last Visit: 06/06/21 ?Next Visit: 09/21/21 ?Last Refill: 05/16/21(90,0) ? ? ?Requesting: oxycodone ?Contract: 02/08/21 ?UDS: 02/08/21 ?Last Visit:06/06/21 ?Next Visit: 09/21/21 ?Last Refill: 05/16/21(90,0) ? ?Please Advise. Meds pending ? ?

## 2021-06-17 DIAGNOSIS — F411 Generalized anxiety disorder: Secondary | ICD-10-CM | POA: Diagnosis not present

## 2021-06-17 DIAGNOSIS — F4310 Post-traumatic stress disorder, unspecified: Secondary | ICD-10-CM | POA: Diagnosis not present

## 2021-07-14 ENCOUNTER — Other Ambulatory Visit: Payer: Self-pay | Admitting: Family Medicine

## 2021-07-14 DIAGNOSIS — I10 Essential (primary) hypertension: Secondary | ICD-10-CM

## 2021-07-14 DIAGNOSIS — G894 Chronic pain syndrome: Secondary | ICD-10-CM

## 2021-07-15 ENCOUNTER — Other Ambulatory Visit: Payer: Self-pay | Admitting: Family Medicine

## 2021-07-15 DIAGNOSIS — G894 Chronic pain syndrome: Secondary | ICD-10-CM

## 2021-07-15 DIAGNOSIS — I10 Essential (primary) hypertension: Secondary | ICD-10-CM

## 2021-07-15 NOTE — Telephone Encounter (Signed)
Refills pending approval via telephone note from 4/21. Duplicate request, please deny ?

## 2021-07-15 NOTE — Telephone Encounter (Signed)
Requesting: Morphine ?Contract: 10/08/20 ?UDS: 10/08/20 ?Last Visit: 06/06/21 ?Next Visit: 09/21/21 ?Last Refill: 06/14/21 (90,0) ? ?Please Advise. Med pending ? ? ?Requesting: Oxycodone HCl ?Contract: 10/08/20 ?UDS: 10/08/20 ?Last Visit: 06/06/21 ?Next Visit: 09/21/21 ?Last Refill: 06/14/21(90,0) ? ?Please Advise. Med pending ?

## 2021-08-11 ENCOUNTER — Other Ambulatory Visit: Payer: Self-pay | Admitting: Family Medicine

## 2021-08-11 DIAGNOSIS — I10 Essential (primary) hypertension: Secondary | ICD-10-CM

## 2021-08-11 DIAGNOSIS — G894 Chronic pain syndrome: Secondary | ICD-10-CM

## 2021-08-12 MED ORDER — OXYCODONE HCL 10 MG PO TABS
ORAL_TABLET | ORAL | 0 refills | Status: DC
Start: 2021-08-12 — End: 2021-09-09

## 2021-08-12 MED ORDER — MORPHINE SULFATE ER 30 MG PO TBCR: EXTENDED_RELEASE_TABLET | ORAL | 0 refills | Status: DC

## 2021-08-12 NOTE — Telephone Encounter (Signed)
Requesting: Morphine Contract: 10/08/20 UDS: 10/08/20 Last Visit: 06/06/21 Next Visit: advised to f/u June Last Refill:07/15/21(90,0)  Please Advise. Med pending   Requesting: oxycodone Contract: 10/08/20 UDS:10/08/20 Last Visit: 06/06/21 Next Visit: advised to f/u June Last Refill: 07/15/21(90,0)  Please Advise. Med pending

## 2021-08-28 ENCOUNTER — Encounter: Payer: Self-pay | Admitting: Family Medicine

## 2021-08-29 ENCOUNTER — Other Ambulatory Visit: Payer: Self-pay | Admitting: Family Medicine

## 2021-08-29 NOTE — Telephone Encounter (Signed)
Noted  

## 2021-08-29 NOTE — Telephone Encounter (Signed)
90 tablet 1 06/06/2021   Last refill on medication. Pt advised 1 refill remaining

## 2021-09-05 ENCOUNTER — Encounter: Payer: Self-pay | Admitting: Family Medicine

## 2021-09-06 NOTE — Telephone Encounter (Signed)
Okay, sounds fine.

## 2021-09-06 NOTE — Telephone Encounter (Signed)
FYI for appt on 6/28

## 2021-09-07 NOTE — Telephone Encounter (Signed)
Noted for appt

## 2021-09-09 ENCOUNTER — Other Ambulatory Visit: Payer: Self-pay | Admitting: Family Medicine

## 2021-09-09 DIAGNOSIS — I10 Essential (primary) hypertension: Secondary | ICD-10-CM

## 2021-09-09 DIAGNOSIS — G894 Chronic pain syndrome: Secondary | ICD-10-CM

## 2021-09-09 NOTE — Telephone Encounter (Signed)
Morphine and oxycodone rx's sent. Make sure he follows up within the next 1 mo-thx

## 2021-09-09 NOTE — Telephone Encounter (Signed)
Requesting: morphine  Contract: 10/08/20 UDS: 10/08/20 Last Visit: 06/06/21 Next Visit: N/A; 3 month RCI Last Refill: 08/12/21(90,0)  Requesting: oxycodone Contract: 10/08/20 UDS: 10/08/20 Last Visit: 06/06/21 Next Visit: N/A; 3 month RCI Last Refill: 08/12/21(90,0)  Please Advise. Meds pending

## 2021-09-11 ENCOUNTER — Other Ambulatory Visit: Payer: Self-pay | Admitting: Family Medicine

## 2021-09-11 DIAGNOSIS — I10 Essential (primary) hypertension: Secondary | ICD-10-CM

## 2021-09-11 DIAGNOSIS — G894 Chronic pain syndrome: Secondary | ICD-10-CM

## 2021-09-12 NOTE — Telephone Encounter (Signed)
Medications signed 3 days ago. Please deny current refill requests.

## 2021-09-13 ENCOUNTER — Ambulatory Visit: Payer: PPO

## 2021-09-21 ENCOUNTER — Ambulatory Visit: Payer: PPO

## 2021-09-21 ENCOUNTER — Ambulatory Visit (INDEPENDENT_AMBULATORY_CARE_PROVIDER_SITE_OTHER): Payer: PPO

## 2021-09-21 VITALS — BP 122/80 | HR 102 | Temp 98.4°F | Wt 283.8 lb

## 2021-09-21 DIAGNOSIS — Z0000 Encounter for general adult medical examination without abnormal findings: Secondary | ICD-10-CM | POA: Diagnosis not present
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
## 2021-09-21 NOTE — Patient Instructions (Signed)
[name] , Thank you for taking time to come for your Medicare Wellness Visit. I appreciate your ongoing commitment to your health goals. Please review the following plan we discussed and let me know if I can assist you in the future.   Screening recommendations/referrals: Colonoscopy: Done 03/31/16 repeat every 10 years  Recommended yearly ophthalmology/optometry visit for glaucoma screening and checkup Recommended yearly dental visit for hygiene and checkup  Vaccinations: Influenza vaccine: Done 01/21/21 repeat every year  Pneumococcal vaccine: due  Tdap vaccine: pt stated 09/26/21 at   Shingles vaccine: Shingrix discussed. Please contact your pharmacy for coverage information.    Covid-19: Completed 4/14, 5/12, 02/27/20   Advanced directives: Please bring a copy of your health care power of attorney and living will to the office at your convenience.  Conditions/risks identified: live long and better health   Next appointment: Follow up in one year for your annual wellness visit.   Preventive Care 69 Years and Older, Male Preventive care refers to lifestyle choices and visits with your health care provider that can promote health and wellness. What does preventive care include? A yearly physical exam. This is also called an annual well check. Dental exams once or twice a year. Routine eye exams. Ask your health care provider how often you should have your eyes checked. Personal lifestyle choices, including: Daily care of your teeth and gums. Regular physical activity. Eating a healthy diet. Avoiding tobacco and drug use. Limiting alcohol use. Practicing safe sex. Taking low doses of aspirin every day. Taking vitamin and mineral supplements as recommended by your health care provider. What happens during an annual well check? The services and screenings done by your health care provider during your annual well check will depend on your age, overall health, lifestyle risk  factors, and family history of disease. Counseling  Your health care provider may ask you questions about your: Alcohol use. Tobacco use. Drug use. Emotional well-being. Home and relationship well-being. Sexual activity. Eating habits. History of falls. Memory and ability to understand (cognition). Work and work Statistician. Screening  You may have the following tests or measurements: Height, weight, and BMI. Blood pressure. Lipid and cholesterol levels. These may be checked every 5 years, or more frequently if you are over 54 years old. Skin check. Lung cancer screening. You may have this screening every year starting at age 69 if you have a 30-pack-year history of smoking and currently smoke or have quit within the past 69 years. Fecal occult blood test (FOBT) of the stool. You may have this test every year starting at age 74. Flexible sigmoidoscopy or colonoscopy. You may have a sigmoidoscopy every 5 years or a colonoscopy every 10 years starting at age 69. Prostate cancer screening. Recommendations will vary depending on your family history and other risks. Hepatitis C blood test. Hepatitis B blood test. Sexually transmitted disease (STD) testing. Diabetes screening. This is done by checking your blood sugar (glucose) after you have not eaten for a while (fasting). You may have this done every 1-3 years. Abdominal aortic aneurysm (AAA) screening. You may need this if you are a current or former smoker. Osteoporosis. You may be screened starting at age 69 if you are at high risk. Talk with your health care provider about your test results, treatment options, and if necessary, the need for more tests. Vaccines  Your health care provider may recommend certain vaccines, such as: Influenza vaccine. This is recommended every year. Tetanus, diphtheria, and acellular pertussis (Tdap,  Td) vaccine. You may need a Td booster every 10 years. Zoster vaccine. You may need this after age  69. Pneumococcal 13-valent conjugate (PCV13) vaccine. One dose is recommended after age 69. Pneumococcal polysaccharide (PPSV23) vaccine. One dose is recommended after age 69. Talk to your health care provider about which screenings and vaccines you need and how often you need them. This information is not intended to replace advice given to you by your health care provider. Make sure you discuss any questions you have with your health care provider. Document Released: 04/09/2015 Document Revised: 12/01/2015 Document Reviewed: 01/12/2015 Elsevier Interactive Patient Education  2017 [employer] Prevention in the Home Falls can cause injuries. They can happen to people of all ages. There are many things you can do to make your home safe and to help prevent falls. What can I do on the outside of my home? Regularly fix the edges of walkways and driveways and fix any cracks. Remove anything that might make you trip as you walk through a door, such as a raised step or threshold. Trim any bushes or trees on the path to your home. Use bright outdoor lighting. Clear any walking paths of anything that might make someone trip, such as rocks or tools. Regularly check to see if handrails are loose or broken. Make sure that both sides of any steps have handrails. Any raised decks and porches should have guardrails on the edges. Have any leaves, snow, or ice cleared regularly. Use sand or salt on walking paths during winter. Clean up any spills in your garage right away. This includes oil or grease spills. What can I do in the bathroom? Use night lights. Install grab bars by the toilet and in the tub and shower. Do not use towel bars as grab bars. Use non-skid mats or decals in the tub or shower. If you need to sit down in the shower, use a plastic, non-slip stool. Keep the floor dry. Clean up any water that spills on the floor as soon as it happens. Remove soap buildup in the tub or shower  regularly. Attach bath mats securely with double-sided non-slip rug tape. Do not have throw rugs and other things on the floor that can make you trip. What can I do in the bedroom? Use night lights. Make sure that you have a light by your bed that is easy to reach. Do not use any sheets or blankets that are too big for your bed. They should not hang down onto the floor. Have a firm chair that has side arms. You can use this for support while you get dressed. Do not have throw rugs and other things on the floor that can make you trip. What can I do in the kitchen? Clean up any spills right away. Avoid walking on wet floors. Keep items that you use a lot in easy-to-reach places. If you need to reach something above you, use a strong step stool that has a grab bar. Keep electrical cords out of the way. Do not use floor polish or wax that makes floors slippery. If you must use wax, use non-skid floor wax. Do not have throw rugs and other things on the floor that can make you trip. What can I do with my stairs? Do not leave any items on the stairs. Make sure that there are handrails on both sides of the stairs and use them. Fix handrails that are broken or loose. Make sure that handrails are as  long as the stairways. Check any carpeting to make sure that it is firmly attached to the stairs. Fix any carpet that is loose or worn. Avoid having throw rugs at the top or bottom of the stairs. If you do have throw rugs, attach them to the floor with carpet tape. Make sure that you have a light switch at the top of the stairs and the bottom of the stairs. If you do not have them, ask someone to add them for you. What else can I do to help prevent falls? Wear shoes that: Do not have high heels. Have rubber bottoms. Are comfortable and fit you well. Are closed at the toe. Do not wear sandals. If you use a stepladder: Make sure that it is fully opened. Do not climb a closed stepladder. Make sure that  both sides of the stepladder are locked into place. Ask someone to hold it for you, if possible. Clearly mark and make sure that you can see: Any grab bars or handrails. First and last steps. Where the edge of each step is. Use tools that help you move around (mobility aids) if they are needed. These include: Canes. Walkers. Scooters. Crutches. Turn on the lights when you go into a dark area. Replace any light bulbs as soon as they burn out. Set up your furniture so you have a clear path. Avoid moving your furniture around. If any of your floors are uneven, fix them. If there are any pets around you, be aware of where they are. Review your medicines with your doctor. Some medicines can make you feel dizzy. This can increase your chance of falling. Ask your doctor what other things that you can do to help prevent falls. This information is not intended to replace advice given to you by your health care provider. Make sure you discuss any questions you have with your health care provider. Document Released: 01/07/2009 Document Revised: 08/19/2015 Document Reviewed: 04/17/2014 Elsevier Interactive Patient Education  2017 [employer].

## 2021-10-07 ENCOUNTER — Other Ambulatory Visit: Payer: Self-pay | Admitting: Family Medicine

## 2021-10-07 DIAGNOSIS — I10 Essential (primary) hypertension: Secondary | ICD-10-CM

## 2021-10-07 DIAGNOSIS — G894 Chronic pain syndrome: Secondary | ICD-10-CM

## 2021-10-07 MED ORDER — OXYCODONE HCL 10 MG PO TABS: ORAL_TABLET | ORAL | 0 refills | Status: DC

## 2021-10-07 MED ORDER — MORPHINE SULFATE ER 30 MG PO TBCR: EXTENDED_RELEASE_TABLET | ORAL | 0 refills | Status: DC

## 2021-10-07 NOTE — Telephone Encounter (Signed)
Requesting: Morphine '30mg'$  Contract: 10/08/20 UDS: 10/08/20 Last Visit: 06/06/21 Next Visit: advised to f/u 3 months Last Refill: 09/09/21(90,0)  Requesting: Oxycodone '10mg'$  Contract: 10/08/20 UDS: 10/08/20 Last Visit: 06/06/21 Next Visit: advised to f/u 3 months Last Refill: 09/09/21(90,0)  Please Advise. Meds pending

## 2021-10-07 NOTE — Telephone Encounter (Signed)
Pt sent refill request for the same medications via MyChart. Sent to provider for approval

## 2021-10-07 NOTE — Telephone Encounter (Signed)
Please decline meds, duplicate request

## 2021-10-07 NOTE — Telephone Encounter (Signed)
1 month supply of each authorized. Patient due for follow-up.

## 2021-10-10 ENCOUNTER — Telehealth: Payer: Self-pay | Admitting: Family Medicine

## 2021-10-10 NOTE — Telephone Encounter (Signed)
PLEASE CANCEL THIS REQUEST.  This is a duplicate.  Patient rec'd his meds this weekend.

## 2021-10-10 NOTE — Telephone Encounter (Signed)
noted 

## 2021-10-10 NOTE — Telephone Encounter (Signed)
Patient is needing a refill on Oxycodone and morphine. There was an error with medication being sent electronically on 7/14. Please advise/resend.  Patient was advised a follow up appointment is needed and patient stated he will call back this week to schedule.

## 2021-10-20 DIAGNOSIS — I10 Essential (primary) hypertension: Secondary | ICD-10-CM | POA: Diagnosis not present

## 2021-10-20 DIAGNOSIS — Z6841 Body Mass Index (BMI) 40.0 and over, adult: Secondary | ICD-10-CM | POA: Diagnosis not present

## 2021-10-20 DIAGNOSIS — E119 Type 2 diabetes mellitus without complications: Secondary | ICD-10-CM | POA: Diagnosis not present

## 2021-10-24 ENCOUNTER — Other Ambulatory Visit: Payer: Self-pay | Admitting: Family Medicine

## 2021-10-31 ENCOUNTER — Ambulatory Visit (INDEPENDENT_AMBULATORY_CARE_PROVIDER_SITE_OTHER): Payer: PPO | Admitting: Family Medicine

## 2021-10-31 ENCOUNTER — Encounter: Payer: Self-pay | Admitting: Family Medicine

## 2021-10-31 VITALS — BP 128/69 | HR 74 | Temp 98.8°F | Ht 68.0 in | Wt 279.8 lb

## 2021-10-31 DIAGNOSIS — I10 Essential (primary) hypertension: Secondary | ICD-10-CM

## 2021-10-31 DIAGNOSIS — F411 Generalized anxiety disorder: Secondary | ICD-10-CM

## 2021-10-31 DIAGNOSIS — M170 Bilateral primary osteoarthritis of knee: Secondary | ICD-10-CM

## 2021-10-31 DIAGNOSIS — G8929 Other chronic pain: Secondary | ICD-10-CM

## 2021-10-31 DIAGNOSIS — F3342 Major depressive disorder, recurrent, in full remission: Secondary | ICD-10-CM

## 2021-10-31 DIAGNOSIS — G894 Chronic pain syndrome: Secondary | ICD-10-CM | POA: Diagnosis not present

## 2021-10-31 DIAGNOSIS — M545 Low back pain, unspecified: Secondary | ICD-10-CM

## 2021-10-31 DIAGNOSIS — E119 Type 2 diabetes mellitus without complications: Secondary | ICD-10-CM | POA: Diagnosis not present

## 2021-10-31 LAB — POCT GLYCOSYLATED HEMOGLOBIN (HGB A1C)
HbA1c POC (<> result, manual entry): 5.8 % (ref 4.0–5.6)
HbA1c, POC (controlled diabetic range): 5.8 % (ref 0.0–7.0)
HbA1c, POC (prediabetic range): 5.8 % (ref 5.7–6.4)
Hemoglobin A1C: 5.8 % — AB (ref 4.0–5.6)

## 2021-10-31 MED ORDER — OXYCODONE HCL 10 MG PO TABS: ORAL_TABLET | ORAL | 0 refills | Status: DC

## 2021-10-31 MED ORDER — MORPHINE SULFATE ER 30 MG PO TBCR: EXTENDED_RELEASE_TABLET | ORAL | 0 refills | Status: DC
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
## 2021-11-01 LAB — BASIC METABOLIC PANEL
BUN: 15 mg/dL (ref 6–23)
CO2: 31 mEq/L (ref 19–32)
Calcium: 10 mg/dL (ref 8.4–10.5)
Chloride: 92 mEq/L — ABNORMAL LOW (ref 96–112)
Creatinine, Ser: 1.19 mg/dL (ref 0.40–1.50)
GFR: 62.54 mL/min (ref >60.00)
Glucose, Bld: 116 mg/dL — ABNORMAL HIGH (ref 70–99)
Potassium: 4.3 mEq/L (ref 3.5–5.1)
Sodium: 139 mEq/L (ref 135–145)

## 2021-11-04 ENCOUNTER — Other Ambulatory Visit: Payer: Self-pay | Admitting: Family Medicine

## 2021-11-04 NOTE — Telephone Encounter (Signed)
Requesting: Clonazepam Contract: 10/31/21 UDS: 10/08/20 Last Visit: 10/31/21 Next Visit: 3 month f/u [facility] Last Refill: 06/06/21(60,5)  Please Advise. Med pending

## 2021-11-04 NOTE — Telephone Encounter (Signed)
Pt advised refill sent. °

## 2021-11-17 ENCOUNTER — Other Ambulatory Visit: Payer: Self-pay | Admitting: Family Medicine

## 2021-12-04 ENCOUNTER — Other Ambulatory Visit: Payer: Self-pay | Admitting: Family Medicine

## 2021-12-04 ENCOUNTER — Encounter: Payer: Self-pay | Admitting: Family Medicine

## 2021-12-04 DIAGNOSIS — I10 Essential (primary) hypertension: Secondary | ICD-10-CM

## 2021-12-04 DIAGNOSIS — G894 Chronic pain syndrome: Secondary | ICD-10-CM

## 2021-12-05 MED ORDER — OXYCODONE HCL 10 MG PO TABS: ORAL_TABLET | ORAL | 0 refills | Status: DC

## 2021-12-05 MED ORDER — MORPHINE SULFATE ER 30 MG PO TBCR: EXTENDED_RELEASE_TABLET | ORAL | 0 refills | Status: DC

## 2021-12-05 NOTE — Telephone Encounter (Signed)
Requesting: oxycodone Contract: 8/7/223 UDS:10/08/20 Last Visit: 10/31/21 Next Visit: 3 mo f/u CPE not scheduled Last Refill: 10/31/21(90,0)  Requesting: morphine  Contract:10/31/21 UDS:10/08/20 Last Visit: 10/31/21 Next Visit: 3 mo CPE not scheduled Last Refill: 10/31/21(90,0)  Please Advise. Meds pending

## 2022-01-04 ENCOUNTER — Telehealth: Payer: PPO | Admitting: Family Medicine

## 2022-01-04 ENCOUNTER — Other Ambulatory Visit: Payer: Self-pay | Admitting: Family Medicine

## 2022-01-04 DIAGNOSIS — G894 Chronic pain syndrome: Secondary | ICD-10-CM

## 2022-01-04 DIAGNOSIS — I10 Essential (primary) hypertension: Secondary | ICD-10-CM

## 2022-01-05 NOTE — Telephone Encounter (Signed)
Duplicate requests, both meds were sent 10/12.   Please decline

## 2022-01-05 NOTE — Telephone Encounter (Signed)
Requesting: Morphine Contract: 10/31/21 UDS: 10/08/20 Last Visit: 10/31/21 Next Visit: 3 mo f/u CPE not scheduled Last Refill: 12/05/21 (90,0)  Requesting: Oxycodone Contract: 10/31/21 UDS: 10/08/20 Last Visit: 10/31/21 Next Visit: 3 mo f/u CPE not scheduled Last Refill: 12/05/21 (90,0)  Please Advise. Meds pending

## 2022-01-05 NOTE — Telephone Encounter (Signed)
Pt advised refill sent. °

## 2022-01-25 ENCOUNTER — Encounter: Payer: Self-pay | Admitting: Family Medicine

## 2022-01-25 ENCOUNTER — Telehealth: Payer: Self-pay | Admitting: Family Medicine

## 2022-01-25 MED ORDER — CLONAZEPAM 0.5 MG PO TABS: ORAL_TABLET | ORAL | 0 refills | Status: DC

## 2022-01-25 NOTE — Telephone Encounter (Signed)
Pt had an appointment scheduled for Nov 6th. He had to cancel due to a death in his family(father). He is unsure when he can reschedule at this time. However,  he is requesting that his Clonazepam be called in for 10 days until he is able to make an appointment. He once again declined to schedule at this time as his schedule is unstable at this time.

## 2022-01-25 NOTE — Telephone Encounter (Signed)
No further action needed.

## 2022-01-25 NOTE — Telephone Encounter (Signed)
Spoke with patient regarding results/recommendations.  

## 2022-01-25 NOTE — Telephone Encounter (Signed)
Okay, clonazepam,# 60 sent

## 2022-01-26 ENCOUNTER — Telehealth: Payer: PPO | Admitting: Family Medicine

## 2022-01-26 ENCOUNTER — Other Ambulatory Visit: Payer: Self-pay | Admitting: Family Medicine

## 2022-01-26 MED ORDER — CLONAZEPAM 0.5 MG PO TABS: ORAL_TABLET | ORAL | 0 refills | Status: DC
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
## 2022-01-30 ENCOUNTER — Ambulatory Visit: Payer: PPO | Admitting: Family Medicine

## 2022-02-03 ENCOUNTER — Other Ambulatory Visit: Payer: Self-pay | Admitting: Family Medicine

## 2022-02-03 DIAGNOSIS — I10 Essential (primary) hypertension: Secondary | ICD-10-CM

## 2022-02-03 DIAGNOSIS — G894 Chronic pain syndrome: Secondary | ICD-10-CM

## 2022-02-06 ENCOUNTER — Other Ambulatory Visit: Payer: Self-pay | Admitting: Family Medicine

## 2022-02-06 ENCOUNTER — Encounter: Payer: Self-pay | Admitting: Family Medicine

## 2022-02-06 DIAGNOSIS — G894 Chronic pain syndrome: Secondary | ICD-10-CM

## 2022-02-06 DIAGNOSIS — I10 Essential (primary) hypertension: Secondary | ICD-10-CM

## 2022-02-06 NOTE — Telephone Encounter (Addendum)
Patient sent mychart message stating he would like to change pharmacies. Patient confirmed he will not be changing pharmacies at this time.

## 2022-02-06 NOTE — Telephone Encounter (Addendum)
Requesting: Morphine Contract: 10/31/21 UDS: 10/08/20 Last Visit: 10/31/21 Next Visit: 02/08/22 Last Refill: 01/05/22(90,0)  Requesting: Oxycodone Contract: 10/31/21 UDS: 10/08/20 Last Visit: 10/31/21 Next Visit: 02/08/22 Last Refill: 01/05/22(90,0)  Please Advise. Meds pending.

## 2022-02-06 NOTE — Telephone Encounter (Signed)
Noted  

## 2022-02-07 NOTE — Telephone Encounter (Signed)
Pt advised refill sent. °

## 2022-02-07 NOTE — Telephone Encounter (Signed)
Pt advised of medication refills.

## 2022-02-07 NOTE — Telephone Encounter (Signed)
Duplicate request. Please decline

## 2022-02-07 NOTE — Telephone Encounter (Signed)
Please call regarding medications.  He said his pharmacy stated meds were denied by PCP. He stated that request has been for over 1 week now.  Please call [phone].  Patient aware Dr. Anitra Lauth has left office for today.

## 2022-02-08 ENCOUNTER — Encounter: Payer: Self-pay | Admitting: Family Medicine

## 2022-02-08 ENCOUNTER — Ambulatory Visit (INDEPENDENT_AMBULATORY_CARE_PROVIDER_SITE_OTHER): Payer: PPO | Admitting: Family Medicine

## 2022-02-08 VITALS — BP 123/72 | HR 91 | Temp 98.6°F | Ht 68.0 in | Wt 288.6 lb

## 2022-02-08 DIAGNOSIS — Z79899 Other long term (current) drug therapy: Secondary | ICD-10-CM

## 2022-02-08 DIAGNOSIS — M545 Low back pain, unspecified: Secondary | ICD-10-CM

## 2022-02-08 DIAGNOSIS — I10 Essential (primary) hypertension: Secondary | ICD-10-CM | POA: Diagnosis not present

## 2022-02-08 DIAGNOSIS — G8929 Other chronic pain: Secondary | ICD-10-CM | POA: Diagnosis not present

## 2022-02-08 DIAGNOSIS — Z0000 Encounter for general adult medical examination without abnormal findings: Secondary | ICD-10-CM

## 2022-02-08 DIAGNOSIS — G894 Chronic pain syndrome: Secondary | ICD-10-CM

## 2022-02-08 DIAGNOSIS — Z23 Encounter for immunization: Secondary | ICD-10-CM | POA: Diagnosis not present

## 2022-02-08 DIAGNOSIS — M170 Bilateral primary osteoarthritis of knee: Secondary | ICD-10-CM | POA: Diagnosis not present

## 2022-02-08 DIAGNOSIS — E119 Type 2 diabetes mellitus without complications: Secondary | ICD-10-CM | POA: Diagnosis not present

## 2022-02-08 DIAGNOSIS — E1142 Type 2 diabetes mellitus with diabetic polyneuropathy: Secondary | ICD-10-CM | POA: Diagnosis not present

## 2022-02-08 DIAGNOSIS — Z125 Encounter for screening for malignant neoplasm of prostate: Secondary | ICD-10-CM | POA: Diagnosis not present

## 2022-02-08 LAB — LIPID PANEL
Cholesterol: 181 mg/dL (ref 0–200)
HDL: 45.7 mg/dL (ref >39.00)
LDL Cholesterol: 104 mg/dL — ABNORMAL HIGH (ref 0–99)
NonHDL: 135.25
Total CHOL/HDL Ratio: 4
Triglycerides: 157 mg/dL — ABNORMAL HIGH (ref 0.0–149.0)
VLDL: 31.4 mg/dL (ref 0.0–40.0)

## 2022-02-08 LAB — CBC
HCT: 42.9 % (ref 39.0–52.0)
Hemoglobin: 14.5 g/dL (ref 13.0–17.0)
MCHC: 33.9 g/dL (ref 30.0–36.0)
MCV: 98.1 fl (ref 78.0–100.0)
Platelets: 206 10*3/uL (ref 150.0–400.0)
RBC: 4.37 Mil/uL (ref 4.22–5.81)
RDW: 14 % (ref 11.5–15.5)
WBC: 7.3 10*3/uL (ref 4.0–10.5)

## 2022-02-08 LAB — POCT GLYCOSYLATED HEMOGLOBIN (HGB A1C)
HbA1c POC (<> result, manual entry): 5.9 % (ref 4.0–5.6)
HbA1c, POC (controlled diabetic range): 5.9 % (ref 0.0–7.0)
HbA1c, POC (prediabetic range): 5.9 % (ref 5.7–6.4)
Hemoglobin A1C: 5.9 % — AB (ref 4.0–5.6)

## 2022-02-08 LAB — COMPREHENSIVE METABOLIC PANEL
ALT: 19 U/L (ref 0–53)
AST: 16 U/L (ref 0–37)
Albumin: 4.1 g/dL (ref 3.5–5.2)
Alkaline Phosphatase: 44 U/L (ref 39–117)
BUN: 22 mg/dL (ref 6–23)
CO2: 35 mEq/L — ABNORMAL HIGH (ref 19–32)
Calcium: 9.2 mg/dL (ref 8.4–10.5)
Chloride: 95 mEq/L — ABNORMAL LOW (ref 96–112)
Creatinine, Ser: 1.22 mg/dL (ref 0.40–1.50)
GFR: 60.58 mL/min (ref >60.00)
Glucose, Bld: 117 mg/dL — ABNORMAL HIGH (ref 70–99)
Potassium: 4.1 mEq/L (ref 3.5–5.1)
Sodium: 136 mEq/L (ref 135–145)
Total Bilirubin: 0.6 mg/dL (ref 0.2–1.2)
Total Protein: 7 g/dL (ref 6.0–8.3)

## 2022-02-08 LAB — MICROALBUMIN / CREATININE URINE RATIO
Creatinine,U: 125.3 mg/dL
Microalb Creat Ratio: 3.5 mg/g (ref 0.0–30.0)
Microalb, Ur: 4.4 mg/dL — ABNORMAL HIGH (ref 0.0–1.9)

## 2022-02-08 LAB — PSA, MEDICARE: PSA: 0.32 ng/ml (ref 0.10–4.00)

## 2022-02-08 MED ORDER — LISINOPRIL 10 MG PO TABS: 10.0000 mg | ORAL_TABLET | Freq: Every day | ORAL | 1 refills | Status: DC

## 2022-02-08 MED ORDER — ESCITALOPRAM OXALATE 10 MG PO TABS: ORAL_TABLET | ORAL | 1 refills | Status: DC

## 2022-02-08 MED ORDER — HYDROCHLOROTHIAZIDE 25 MG PO TABS: 25.0000 mg | ORAL_TABLET | Freq: Every day | ORAL | 1 refills | Status: DC
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
## 2022-02-09 ENCOUNTER — Telehealth: Payer: PPO | Admitting: Family Medicine
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
## 2022-02-10 ENCOUNTER — Encounter: Payer: Self-pay | Admitting: Family Medicine

## 2022-02-11 ENCOUNTER — Encounter: Payer: Self-pay | Admitting: Family Medicine

## 2022-02-11 LAB — DRUG MONITORING PANEL 376104, URINE
Alphahydroxyalprazolam: NEGATIVE ng/mL (ref <25)
Alphahydroxymidazolam: NEGATIVE ng/mL (ref <50)
Alphahydroxytriazolam: NEGATIVE ng/mL (ref <50)
Aminoclonazepam: 426 ng/mL — ABNORMAL HIGH (ref <25)
Amphetamines: NEGATIVE ng/mL (ref <500)
Barbiturates: NEGATIVE ng/mL (ref <300)
Benzodiazepines: POSITIVE ng/mL — AB (ref <100)
Cocaine Metabolite: NEGATIVE ng/mL (ref <150)
Codeine: NEGATIVE ng/mL (ref <50)
Desmethyltramadol: NEGATIVE ng/mL (ref <100)
Hydrocodone: NEGATIVE ng/mL (ref <50)
Hydromorphone: 246 ng/mL — ABNORMAL HIGH (ref <50)
Hydroxyethylflurazepam: NEGATIVE ng/mL (ref <50)
Lorazepam: NEGATIVE ng/mL (ref <50)
Morphine: >10000 ng/mL — ABNORMAL HIGH (ref <50)
Nordiazepam: NEGATIVE ng/mL (ref <50)
Norhydrocodone: NEGATIVE ng/mL (ref <50)
Noroxycodone: 4742 ng/mL — ABNORMAL HIGH (ref <50)
Opiates: POSITIVE ng/mL — AB (ref <100)
Oxazepam: NEGATIVE ng/mL (ref <50)
Oxycodone: 3972 ng/mL — ABNORMAL HIGH (ref <50)
Oxycodone: POSITIVE ng/mL — AB (ref <100)
Oxymorphone: 973 ng/mL — ABNORMAL HIGH (ref <50)
Temazepam: NEGATIVE ng/mL (ref <50)
Tramadol: NEGATIVE ng/mL (ref <100)

## 2022-02-11 LAB — DM TEMPLATE

## 2022-02-14 NOTE — Telephone Encounter (Signed)
No further action needed.

## 2022-02-14 NOTE — Telephone Encounter (Signed)
Richard Levine, I am hesitant to do this because in the past I have worried about your tendency to be oversedated by this type of medication. I'm worried about the additive sedation effects from clonazepam and morphine and oxycodone. I think it is safest to not use any additional/new sedative medication. Sorry. --PM

## 2022-02-22 ENCOUNTER — Other Ambulatory Visit: Payer: Self-pay | Admitting: Family Medicine

## 2022-02-22 MED ORDER — CLONAZEPAM 0.5 MG PO TABS: ORAL_TABLET | ORAL | 5 refills | Status: DC

## 2022-02-22 NOTE — Telephone Encounter (Signed)
Requesting: clonazepam Contract: 10/31/21 UDS: 10/08/20 Last Visit: 02/08/22 Next Visit: 05/11/22 Last Refill: 01/26/22(60,0)  Please Advise. Med pending

## 2022-02-22 NOTE — Telephone Encounter (Addendum)
Duplicate request. Message sent to provider. Please decline refill.

## 2022-03-06 ENCOUNTER — Telehealth: Payer: PPO | Admitting: Family Medicine

## 2022-03-07 ENCOUNTER — Other Ambulatory Visit: Payer: Self-pay | Admitting: Family Medicine

## 2022-03-07 DIAGNOSIS — G894 Chronic pain syndrome: Secondary | ICD-10-CM

## 2022-03-07 DIAGNOSIS — I10 Essential (primary) hypertension: Secondary | ICD-10-CM

## 2022-03-07 NOTE — Telephone Encounter (Signed)
Requesting: morphine  Contract: 10/31/21 UDS: 02/08/22 Last Visit: 02/08/22 Next Visit: 03/09/22 Last Refill: 02/06/22 (90,0)  Please Advise. Med pending

## 2022-03-07 NOTE — Telephone Encounter (Signed)
Requesting: Oxycodone Contract: 10/31/21 UDS: 02/08/22 Last Visit: 02/08/22 Next Visit: 03/09/22 Last Refill: 02/06/22 (90,0)   Please Advise. Med pending

## 2022-03-08 NOTE — Telephone Encounter (Signed)
Pt advised refill sent. °

## 2022-03-09 ENCOUNTER — Encounter: Payer: Self-pay | Admitting: Family Medicine

## 2022-03-09 ENCOUNTER — Telehealth (INDEPENDENT_AMBULATORY_CARE_PROVIDER_SITE_OTHER): Payer: PPO | Admitting: Family Medicine
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
## 2022-03-10 NOTE — Telephone Encounter (Signed)
Hi Richard Levine, I would really be hesitant about doing this procedure. I think the potential risks outweigh the potential benefit.

## 2022-04-06 ENCOUNTER — Other Ambulatory Visit: Payer: Self-pay | Admitting: Family Medicine

## 2022-04-06 DIAGNOSIS — I10 Essential (primary) hypertension: Secondary | ICD-10-CM

## 2022-04-06 DIAGNOSIS — G894 Chronic pain syndrome: Secondary | ICD-10-CM

## 2022-04-06 MED ORDER — MORPHINE SULFATE ER 30 MG PO TBCR: EXTENDED_RELEASE_TABLET | ORAL | 0 refills | Status: DC

## 2022-04-06 MED ORDER — OXYCODONE HCL 10 MG PO TABS: ORAL_TABLET | ORAL | 0 refills | Status: DC

## 2022-04-06 NOTE — Telephone Encounter (Signed)
Patient is changing delivery method to pickup to pharmacy delivery.

## 2022-04-30 ENCOUNTER — Encounter: Payer: Self-pay | Admitting: Family Medicine

## 2022-05-01 NOTE — Telephone Encounter (Signed)
In person please

## 2022-05-01 NOTE — Telephone Encounter (Signed)
Please advise.   Note:  Return in about 3 months (around 05/11/2022) for routine chronic illness f/u. Labs stable.

## 2022-05-04 ENCOUNTER — Encounter: Payer: Self-pay | Admitting: Family Medicine

## 2022-05-04 ENCOUNTER — Other Ambulatory Visit: Payer: Self-pay | Admitting: Family Medicine

## 2022-05-04 DIAGNOSIS — I10 Essential (primary) hypertension: Secondary | ICD-10-CM

## 2022-05-04 DIAGNOSIS — G894 Chronic pain syndrome: Secondary | ICD-10-CM

## 2022-05-04 NOTE — Telephone Encounter (Signed)
Requesting: morphine Contract: 10/31/21 UDS: 02/08/22 Last Visit: 03/09/22 Next Visit: 05/11/22 Last Refill: 04/06/22 (90,00  Requesting: oxycodone Contract: 10/31/21 UDS: 02/08/22 Last Visit: 03/09/22 Next Visit: 05/11/22 Last Refill: 04/06/22 (90,0)  Please Advise. Meds pending

## 2022-05-09 ENCOUNTER — Encounter: Payer: Self-pay | Admitting: Family Medicine

## 2022-05-11 ENCOUNTER — Encounter: Payer: Self-pay | Admitting: Family Medicine

## 2022-05-11 ENCOUNTER — Telehealth (INDEPENDENT_AMBULATORY_CARE_PROVIDER_SITE_OTHER): Payer: PPO | Admitting: Family Medicine

## 2022-05-11 DIAGNOSIS — R490 Dysphonia: Secondary | ICD-10-CM
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
## 2022-05-12 ENCOUNTER — Other Ambulatory Visit: Payer: Self-pay | Admitting: Family Medicine

## 2022-06-01 ENCOUNTER — Other Ambulatory Visit: Payer: Self-pay | Admitting: Family Medicine

## 2022-06-01 NOTE — Telephone Encounter (Signed)
Requesting: oxycodone Contract: 10/31/21 UDS: 02/08/22 Last Visit: 05/11/22, acute; 03/09/22 RCI Next Visit: no appt scheduled currently Last Refill: 05/04/22 (90,0)  Please Advise. Med pending

## 2022-06-02 ENCOUNTER — Telehealth: Payer: Self-pay | Admitting: Family Medicine

## 2022-06-02 ENCOUNTER — Other Ambulatory Visit: Payer: Self-pay | Admitting: Family Medicine

## 2022-06-02 DIAGNOSIS — G894 Chronic pain syndrome: Secondary | ICD-10-CM

## 2022-06-02 DIAGNOSIS — I10 Essential (primary) hypertension: Secondary | ICD-10-CM

## 2022-06-02 NOTE — Telephone Encounter (Signed)
Provider sent refill request for approval, pt advised pending approval by provider. We will send a mychart message once medication has been approved

## 2022-06-02 NOTE — Telephone Encounter (Signed)
Requesting: morphine Contract: 10/31/21 UDS: 02/08/22 Last Visit: 05/11/22, acute; RCI 03/09/22 Next Visit: no f/u scheduled Last Refill: 05/04/22 (90,0)  Please Advise. Med pending

## 2022-06-02 NOTE — Telephone Encounter (Signed)
[name] calls and reports his pharmacy just delivered his Oxycodone, but the Morphine was not delivered. I see that it looks like it was on our end since it was sent 2/8. Please have the Morphine sent since he says he takes both medications at the same time.

## 2022-07-03 ENCOUNTER — Other Ambulatory Visit: Payer: Self-pay | Admitting: Family Medicine

## 2022-07-03 DIAGNOSIS — G894 Chronic pain syndrome: Secondary | ICD-10-CM

## 2022-07-03 DIAGNOSIS — I10 Essential (primary) hypertension: Secondary | ICD-10-CM

## 2022-07-03 NOTE — Telephone Encounter (Signed)
Requesting: oxycodone Contract: 10/31/21 UDS: 02/08/22 Last Visit: 05/11/22, acute Next Visit: no f/u scheduled Last Refill: 06/01/22 (90,0)  Requesting: morphine  Contract: 10/31/21 UDS: 02/08/22 Last Visit: 05/11/22, acute Next Visit: no f/u scheduled Last Refill: 06/02/22 (90,0)  Please Advise. Meds pending

## 2022-07-03 NOTE — Telephone Encounter (Signed)
Oxycodone and morphine prescriptions sent. Patient due for follow-up chronic pain.

## 2022-07-20 ENCOUNTER — Telehealth: Payer: Self-pay | Admitting: Family Medicine

## 2022-07-20 NOTE — Telephone Encounter (Signed)
Contacted [name] to schedule their annual wellness visit. Appointment made for 07/26/2022.  [facility] TEAM Direct Dial [phone]

## 2022-07-26 ENCOUNTER — Ambulatory Visit (INDEPENDENT_AMBULATORY_CARE_PROVIDER_SITE_OTHER): Payer: PPO

## 2022-07-26 VITALS — Wt 288.0 lb

## 2022-07-26 DIAGNOSIS — Z0000 Encounter for general adult medical examination without abnormal findings: Secondary | ICD-10-CM | POA: Diagnosis not present

## 2022-07-26 NOTE — Patient Instructions (Signed)
[name] , Thank you for taking time to come for your Medicare Wellness Visit. I appreciate your ongoing commitment to your health goals. Please review the following plan we discussed and let me know if I can assist you in the future.   These are the goals we discussed:  Goals      Patient Stated     Live long and get better health     Patient Stated     Better health      Weight (lb) < 200 lb (90.7 kg)     Increase physical activity     Weight (lb) < 215 lb (97.5 kg)     Lose weight by monitoring diet and increase activity.         This is a list of the screening recommended for you and due dates:  Health Maintenance  Topic Date Due   DTaP/Tdap/Td vaccine (1 - Tdap) Never done   Pneumonia Vaccine (2 of 2 - PCV) 10/21/2017   COVID-19 Vaccine (4 - 2023-24 season) 11/25/2021   Eye exam for diabetics  03/15/2022   Zoster (Shingles) Vaccine (1 of 2) 10/26/2022*   Hemoglobin A1C  08/09/2022   Flu Shot  10/26/2022   Yearly kidney function blood test for diabetes  02/09/2023   Yearly kidney health urinalysis for diabetes  02/09/2023   Complete foot exam   02/09/2023   Medicare Annual Wellness Visit  07/26/2023   Colon Cancer Screening  03/31/2026   HPV Vaccine  Aged Out   Hepatitis C Screening: USPSTF Recommendation to screen - Ages 18-79 yo.  Discontinued  *Topic was postponed. The date shown is not the original due date.    Advanced directives: Advance directive discussed with you today. Even though you declined this today please call our office should you change your mind and we can give you the proper paperwork for you to fill out.  Conditions/risks identified: better health   Next appointment: Follow up in one year for your annual wellness visit.   Preventive Care 44 Years and Older, Male  Preventive care refers to lifestyle choices and visits with your health care provider that can promote health and wellness. What does preventive care include? A yearly physical  exam. This is also called an annual well check. Dental exams once or twice a year. Routine eye exams. Ask your health care provider how often you should have your eyes checked. Personal lifestyle choices, including: Daily care of your teeth and gums. Regular physical activity. Eating a healthy diet. Avoiding tobacco and drug use. Limiting alcohol use. Practicing safe sex. Taking low doses of aspirin every day. Taking vitamin and mineral supplements as recommended by your health care provider. What happens during an annual well check? The services and screenings done by your health care provider during your annual well check will depend on your age, overall health, lifestyle risk factors, and family history of disease. Counseling  Your health care provider may ask you questions about your: Alcohol use. Tobacco use. Drug use. Emotional well-being. Home and relationship well-being. Sexual activity. Eating habits. History of falls. Memory and ability to understand (cognition). Work and work Astronomer. Screening  You may have the following tests or measurements: Height, weight, and BMI. Blood pressure. Lipid and cholesterol levels. These may be checked every 5 years, or more frequently if you are over 32 years old. Skin check. Lung cancer screening. You may have this screening every year starting at age 48 if you have a 30-pack-year  history of smoking and currently smoke or have quit within the past 15 years. Fecal occult blood test (FOBT) of the stool. You may have this test every year starting at age 26. Flexible sigmoidoscopy or colonoscopy. You may have a sigmoidoscopy every 5 years or a colonoscopy every 10 years starting at age 82. Prostate cancer screening. Recommendations will vary depending on your family history and other risks. Hepatitis C blood test. Hepatitis B blood test. Sexually transmitted disease (STD) testing. Diabetes screening. This is done by checking your  blood sugar (glucose) after you have not eaten for a while (fasting). You may have this done every 1-3 years. Abdominal aortic aneurysm (AAA) screening. You may need this if you are a current or former smoker. Osteoporosis. You may be screened starting at age 41 if you are at high risk. Talk with your health care provider about your test results, treatment options, and if necessary, the need for more tests. Vaccines  Your health care provider may recommend certain vaccines, such as: Influenza vaccine. This is recommended every year. Tetanus, diphtheria, and acellular pertussis (Tdap, Td) vaccine. You may need a Td booster every 10 years. Zoster vaccine. You may need this after age 81. Pneumococcal 13-valent conjugate (PCV13) vaccine. One dose is recommended after age 102. Pneumococcal polysaccharide (PPSV23) vaccine. One dose is recommended after age 15. Talk to your health care provider about which screenings and vaccines you need and how often you need them. This information is not intended to replace advice given to you by your health care provider. Make sure you discuss any questions you have with your health care provider. Document Released: 04/09/2015 Document Revised: 12/01/2015 Document Reviewed: 01/12/2015 Elsevier Interactive Patient Education  2017 [employer].  Fall Prevention in the Home Falls can cause injuries. They can happen to people of all ages. There are many things you can do to make your home safe and to help prevent falls. What can I do on the outside of my home? Regularly fix the edges of walkways and driveways and fix any cracks. Remove anything that might make you trip as you walk through a door, such as a raised step or threshold. Trim any bushes or trees on the path to your home. Use bright outdoor lighting. Clear any walking paths of anything that might make someone trip, such as rocks or tools. Regularly check to see if handrails are loose or broken. Make sure  that both sides of any steps have handrails. Any raised decks and porches should have guardrails on the edges. Have any leaves, snow, or ice cleared regularly. Use sand or salt on walking paths during winter. Clean up any spills in your garage right away. This includes oil or grease spills. What can I do in the bathroom? Use night lights. Install grab bars by the toilet and in the tub and shower. Do not use towel bars as grab bars. Use non-skid mats or decals in the tub or shower. If you need to sit down in the shower, use a plastic, non-slip stool. Keep the floor dry. Clean up any water that spills on the floor as soon as it happens. Remove soap buildup in the tub or shower regularly. Attach bath mats securely with double-sided non-slip rug tape. Do not have throw rugs and other things on the floor that can make you trip. What can I do in the bedroom? Use night lights. Make sure that you have a light by your bed that is easy to reach. Do  not use any sheets or blankets that are too big for your bed. They should not hang down onto the floor. Have a firm chair that has side arms. You can use this for support while you get dressed. Do not have throw rugs and other things on the floor that can make you trip. What can I do in the kitchen? Clean up any spills right away. Avoid walking on wet floors. Keep items that you use a lot in easy-to-reach places. If you need to reach something above you, use a strong step stool that has a grab bar. Keep electrical cords out of the way. Do not use floor polish or wax that makes floors slippery. If you must use wax, use non-skid floor wax. Do not have throw rugs and other things on the floor that can make you trip. What can I do with my stairs? Do not leave any items on the stairs. Make sure that there are handrails on both sides of the stairs and use them. Fix handrails that are broken or loose. Make sure that handrails are as long as the  stairways. Check any carpeting to make sure that it is firmly attached to the stairs. Fix any carpet that is loose or worn. Avoid having throw rugs at the top or bottom of the stairs. If you do have throw rugs, attach them to the floor with carpet tape. Make sure that you have a light switch at the top of the stairs and the bottom of the stairs. If you do not have them, ask someone to add them for you. What else can I do to help prevent falls? Wear shoes that: Do not have high heels. Have rubber bottoms. Are comfortable and fit you well. Are closed at the toe. Do not wear sandals. If you use a stepladder: Make sure that it is fully opened. Do not climb a closed stepladder. Make sure that both sides of the stepladder are locked into place. Ask someone to hold it for you, if possible. Clearly mark and make sure that you can see: Any grab bars or handrails. First and last steps. Where the edge of each step is. Use tools that help you move around (mobility aids) if they are needed. These include: Canes. Walkers. Scooters. Crutches. Turn on the lights when you go into a dark area. Replace any light bulbs as soon as they burn out. Set up your furniture so you have a clear path. Avoid moving your furniture around. If any of your floors are uneven, fix them. If there are any pets around you, be aware of where they are. Review your medicines with your doctor. Some medicines can make you feel dizzy. This can increase your chance of falling. Ask your doctor what other things that you can do to help prevent falls. This information is not intended to replace advice given to you by your health care provider. Make sure you discuss any questions you have with your health care provider. Document Released: 01/07/2009 Document Revised: 08/19/2015 Document Reviewed: 04/17/2014 Elsevier Interactive Patient Education  2017 [employer].
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
## 2022-07-28 NOTE — Patient Instructions (Signed)

## 2022-07-31 ENCOUNTER — Ambulatory Visit (INDEPENDENT_AMBULATORY_CARE_PROVIDER_SITE_OTHER): Payer: PPO | Admitting: Family Medicine

## 2022-07-31 ENCOUNTER — Encounter: Payer: Self-pay | Admitting: Family Medicine

## 2022-07-31 VITALS — BP 108/75 | HR 88 | Wt 285.0 lb

## 2022-07-31 DIAGNOSIS — Z79899 Other long term (current) drug therapy: Secondary | ICD-10-CM | POA: Diagnosis not present

## 2022-07-31 DIAGNOSIS — F3342 Major depressive disorder, recurrent, in full remission: Secondary | ICD-10-CM | POA: Diagnosis not present

## 2022-07-31 DIAGNOSIS — E1142 Type 2 diabetes mellitus with diabetic polyneuropathy: Secondary | ICD-10-CM

## 2022-07-31 DIAGNOSIS — I10 Essential (primary) hypertension: Secondary | ICD-10-CM | POA: Diagnosis not present

## 2022-07-31 DIAGNOSIS — E119 Type 2 diabetes mellitus without complications: Secondary | ICD-10-CM

## 2022-07-31 DIAGNOSIS — I7789 Other specified disorders of arteries and arterioles: Secondary | ICD-10-CM

## 2022-07-31 DIAGNOSIS — F411 Generalized anxiety disorder: Secondary | ICD-10-CM

## 2022-07-31 DIAGNOSIS — Z23 Encounter for immunization: Secondary | ICD-10-CM | POA: Diagnosis not present

## 2022-07-31 DIAGNOSIS — G894 Chronic pain syndrome: Secondary | ICD-10-CM

## 2022-07-31 LAB — POCT GLYCOSYLATED HEMOGLOBIN (HGB A1C)
HbA1c POC (<> result, manual entry): 5.9 % (ref 4.0–5.6)
HbA1c, POC (controlled diabetic range): 5.9 % (ref 0.0–7.0)
HbA1c, POC (prediabetic range): 5.9 % (ref 5.7–6.4)
Hemoglobin A1C: 5.9 % — AB (ref 4.0–5.6)

## 2022-07-31 MED ORDER — METOPROLOL SUCCINATE ER 50 MG PO TB24: ORAL_TABLET | ORAL | 1 refills | Status: DC

## 2022-07-31 MED ORDER — MORPHINE SULFATE ER 30 MG PO TBCR: 30.0000 mg | EXTENDED_RELEASE_TABLET | Freq: Three times a day (TID) | ORAL | 0 refills | Status: DC

## 2022-07-31 MED ORDER — LISINOPRIL 10 MG PO TABS: 10.0000 mg | ORAL_TABLET | Freq: Every day | ORAL | 1 refills | Status: DC

## 2022-07-31 MED ORDER — ESCITALOPRAM OXALATE 10 MG PO TABS: ORAL_TABLET | ORAL | 1 refills | Status: DC

## 2022-07-31 MED ORDER — PREDNISONE 10 MG PO TABS: ORAL_TABLET | ORAL | 0 refills | Status: DC

## 2022-07-31 MED ORDER — OXYCODONE HCL 10 MG PO TABS: ORAL_TABLET | ORAL | 0 refills | Status: DC

## 2022-07-31 MED ORDER — HYDROCHLOROTHIAZIDE 25 MG PO TABS: 25.0000 mg | ORAL_TABLET | Freq: Every day | ORAL | 1 refills | Status: DC

## 2022-07-31 MED ORDER — CLONAZEPAM 0.5 MG PO TABS: ORAL_TABLET | ORAL | 5 refills | Status: DC
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
## 2022-08-01 ENCOUNTER — Encounter: Payer: Self-pay | Admitting: Family Medicine

## 2022-08-01 LAB — COMPREHENSIVE METABOLIC PANEL
ALT: 21 U/L (ref 0–53)
AST: 20 U/L (ref 0–37)
Albumin: 4.1 g/dL (ref 3.5–5.2)
Alkaline Phosphatase: 47 U/L (ref 39–117)
BUN: 21 mg/dL (ref 6–23)
CO2: 34 mEq/L — ABNORMAL HIGH (ref 19–32)
Calcium: 9.6 mg/dL (ref 8.4–10.5)
Chloride: 92 mEq/L — ABNORMAL LOW (ref 96–112)
Creatinine, Ser: 1.27 mg/dL (ref 0.40–1.50)
GFR: 57.54 mL/min — ABNORMAL LOW (ref >60.00)
Glucose, Bld: 107 mg/dL — ABNORMAL HIGH (ref 70–99)
Potassium: 4.2 mEq/L (ref 3.5–5.1)
Sodium: 136 mEq/L (ref 135–145)
Total Bilirubin: 0.6 mg/dL (ref 0.2–1.2)
Total Protein: 7.4 g/dL (ref 6.0–8.3)

## 2022-08-01 MED ORDER — TIRZEPATIDE 2.5 MG/0.5ML ~~LOC~~ SOAJ: 2.5000 mg | SUBCUTANEOUS | 0 refills | Status: DC

## 2022-08-01 NOTE — Telephone Encounter (Signed)
Will prescribe Mounjaro. In person or virtual follow-up 1 month.

## 2022-08-01 NOTE — Telephone Encounter (Signed)
LVM for pt regarding medication.   Note: if pt returns call, please inform medication was sent and assist with scheduling.

## 2022-08-02 NOTE — Telephone Encounter (Signed)
No further action needed.

## 2022-08-08 ENCOUNTER — Telehealth: Payer: Self-pay

## 2022-08-08 NOTE — Patient Outreach (Signed)
  Care Coordination   Initial Visit Note   08/08/2022 Name: Richard Levine MRN: [id number] DOB: 1950/06/21  Richard Levine is a 72 y.o. year old male who sees McGowen, Maryjean Morn, MD for primary care. I spoke with  Richard Levine by phone today.  What matters to the patients health and wellness today?  Working to get Richard Levine.  Waiting for prior authorization    Goals Addressed             This Visit's Progress    COMPLETED: Care Coordination Activities-No follow up required       Care Coordination Interventions: Advised patient to Annual Wellness exam. Discussed [facility] services and support. Assessed SDOH. Advised to discuss with primary care physician if services needed in the future.          SDOH assessments and interventions completed:  Yes  SDOH Interventions Today    Flowsheet Row Most Recent Value  SDOH Interventions   Housing Interventions Intervention Not Indicated  Transportation Interventions Intervention Not Indicated  Depression Interventions/Treatment  Currently on Treatment, Medication        Care Coordination Interventions:  Yes, provided   Follow up plan: No further intervention required.   Encounter Outcome:  Pt. Visit Completed   Richard Leriche, RN, MSN [facility] Care Management Care Management Coordinator Direct Line [phone]

## 2022-08-08 NOTE — Patient Instructions (Signed)
Visit Information  Thank you for taking time to visit with me today. Please don't hesitate to contact me if I can be of assistance to you.   Following are the goals we discussed today:   Goals Addressed             This Visit's Progress    COMPLETED: Care Coordination Activities-No follow up required       Care Coordination Interventions: Advised patient to Annual Wellness exam. Discussed [facility] services and support. Assessed SDOH. Advised to discuss with primary care physician if services needed in the future.         If you are experiencing a Mental Health or Behavioral Health Crisis or need someone to talk to, please call the Suicide and Crisis Lifeline: 988   Patient verbalizes understanding of instructions and care plan provided today and agrees to view in MyChart. Active MyChart status and patient understanding of how to access instructions and care plan via MyChart confirmed with patient.     The patient has been provided with contact information for the care management team and has been advised to call with any health related questions or concerns.   Ashelyn Mccravy J Shawnique Mariotti, RN, MSN [facility] Care Management Care Management Coordinator Direct Line [phone]     

## 2022-08-09 ENCOUNTER — Telehealth: Payer: Self-pay | Admitting: Family Medicine

## 2022-08-09 NOTE — Telephone Encounter (Signed)
Noted  

## 2022-08-09 NOTE — Telephone Encounter (Signed)
Patient wanted to say thank you to Dr. Milinda Cave and his team for working hard to get [facility] approved for him.  He says it has been approved and filled.

## 2022-08-30 ENCOUNTER — Encounter: Payer: Self-pay | Admitting: Family Medicine

## 2022-08-30 ENCOUNTER — Other Ambulatory Visit: Payer: Self-pay | Admitting: Family Medicine

## 2022-08-30 NOTE — Telephone Encounter (Signed)
Medications last refilled 5/6, pt has upcoming appt 6/17 to f/u on Mounjaro.  Please advise for Morphine and Oxycodone.

## 2022-09-11 ENCOUNTER — Telehealth (INDEPENDENT_AMBULATORY_CARE_PROVIDER_SITE_OTHER): Payer: PPO | Admitting: Family Medicine

## 2022-09-11 ENCOUNTER — Encounter: Payer: Self-pay | Admitting: Family Medicine

## 2022-09-11 VITALS — BP 117/77

## 2022-09-11 DIAGNOSIS — E1140 Type 2 diabetes mellitus with diabetic neuropathy, unspecified: Secondary | ICD-10-CM

## 2022-09-11 DIAGNOSIS — Z7689 Persons encountering health services in other specified circumstances: Secondary | ICD-10-CM | POA: Diagnosis not present

## 2022-09-11 MED ORDER — TIRZEPATIDE 5 MG/0.5ML ~~LOC~~ SOAJ: 5.0000 mg | SUBCUTANEOUS | 0 refills | Status: DC

## 2022-09-11 MED ORDER — TIRZEPATIDE 7.5 MG/0.5ML ~~LOC~~ SOAJ: 7.5000 mg | SUBCUTANEOUS | 0 refills | Status: DC
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
## 2022-09-15 ENCOUNTER — Telehealth: Payer: Self-pay | Admitting: Family Medicine

## 2022-09-15 NOTE — Telephone Encounter (Signed)
Patient is planning to leave town to go to the beach for the 4[place] of [place] and had planned on leaving today, however he is in need of his Clonzapam prior to leaving and his pharmacy is [facility] in [place] and has limited hours. His medication is due to refill on Sunday and he is asking if maybe it can be approved for an early refill today or Sat because he would like to leave town before Sunday and his pharmacy is only open for 2 hours on Sunday.

## 2022-09-15 NOTE — Telephone Encounter (Signed)
Please disregard prior message. Patient called back and spoke with Annabelle Harman and says they pharmacy corrected the issue with getting the medication.

## 2022-09-29 ENCOUNTER — Other Ambulatory Visit: Payer: Self-pay | Admitting: Family Medicine

## 2022-10-02 ENCOUNTER — Other Ambulatory Visit: Payer: Self-pay | Admitting: Family Medicine

## 2022-10-02 NOTE — Telephone Encounter (Signed)
Requesting: morphine Contract: 10/31/21 UDS: 02/08/22 Last Visit: 09/11/22 Next Visit: no provider appt scheduled Last Refill:  08/31/22 (90,0)  Requesting: oxycodone Contract: 10/31/21 UDS: 02/08/22 Last Visit: 09/11/22 Next Visit: no provider appt scheduled Last Refill: 08/31/22 (90,0)  Please Advise. Meds pending

## 2022-10-02 NOTE — Telephone Encounter (Signed)
Duplicate request. Refills just sent by provider.  Please decline.

## 2022-10-03 ENCOUNTER — Telehealth: Payer: Self-pay

## 2022-10-03 NOTE — Telephone Encounter (Signed)
PA sent via covermymed on 10/03/22   Key: [id number]   Medication: Mounjaro 5mg /0.31ml pen injectors   Dx: V40.98, Z76.89   Per Dr. Milinda Cave pt has tried and failed n/a   Waiting for response.    Outcome: Approved today 09-JUL-24:09-JUL-25 Mounjaro 5MG /0.5ML [place] SOPN Quantity:2; Drug: Mounjaro 5MG /0.5ML pen-injectors

## 2022-10-18 NOTE — Progress Notes (Signed)
[facility] Quality Team Note  Name: Richard Levine Date of Birth: [birth year] MRN: [id number] Date: 10/18/2022  [facility] Quality Team has reviewed this patient's chart, please see recommendations below:  [facility] Quality Other; (EED- Called to offer [facility] Event 10/31/22, left voicemail.)

## 2022-10-25 ENCOUNTER — Ambulatory Visit (HOSPITAL_COMMUNITY): Payer: PPO

## 2022-11-01 ENCOUNTER — Other Ambulatory Visit: Payer: Self-pay | Admitting: Family Medicine

## 2022-11-02 NOTE — Telephone Encounter (Signed)
Refills requested for Hydrocodone and Morphine sent to [facility].  Last OV (MyChart)  09/06/22

## 2022-12-02 ENCOUNTER — Other Ambulatory Visit: Payer: Self-pay | Admitting: Family Medicine

## 2022-12-04 ENCOUNTER — Encounter: Payer: Self-pay | Admitting: Family Medicine

## 2022-12-04 ENCOUNTER — Other Ambulatory Visit: Payer: Self-pay | Admitting: Family Medicine

## 2022-12-04 NOTE — Telephone Encounter (Signed)
Requesting: Oxycodone HCl 10 MG TABS  Contract: 10/31/21 UDS: 02/08/22 Last Visit: 09/11/22 Next Visit: 12/14/22 Last Refill: 11/02/22 (90,0)  Requesting: morphine (MS CONTIN) 30 MG 12 hr tablet  Contract: 10/31/21 UDS: 02/08/22 Last Visit: 09/11/22 Next Visit: 12/14/22 Last Refill: 11/02/22 (90,0)  Please Advise. Meds pending

## 2022-12-05 NOTE — Telephone Encounter (Signed)
New refills sent yesterday, please decline duplicate requests

## 2022-12-14 ENCOUNTER — Ambulatory Visit: Payer: PPO | Admitting: Family Medicine

## 2022-12-18 ENCOUNTER — Ambulatory Visit: Payer: PPO | Admitting: Family Medicine

## 2022-12-18 ENCOUNTER — Encounter: Payer: Self-pay | Admitting: Family Medicine

## 2022-12-18 NOTE — Telephone Encounter (Signed)
Hi Richard Levine, I don't recommend any treatment other than over the counter medication at this time.

## 2022-12-19 NOTE — Telephone Encounter (Signed)
Ok to reschedule for next week?

## 2022-12-27 ENCOUNTER — Ambulatory Visit: Payer: PPO | Admitting: Family Medicine

## 2023-01-02 ENCOUNTER — Other Ambulatory Visit: Payer: Self-pay | Admitting: Family Medicine

## 2023-01-02 NOTE — Telephone Encounter (Signed)
Requesting: morphine Contract: 10/31/21 UDS: 02/08/22 Last Visit: 09/11/22 Next Visit: 01/03/23 Last Refill: 12/04/22 (90,0)  Requesting: oxycodone Contract: 10/31/21 UDS: 02/08/22 Last Visit: 09/11/22 Next Visit: 01/03/23 Last Refill: 12/04/22 (90,0)

## 2023-01-03 ENCOUNTER — Ambulatory Visit (INDEPENDENT_AMBULATORY_CARE_PROVIDER_SITE_OTHER): Payer: PPO | Admitting: Family Medicine

## 2023-01-03 VITALS — BP 122/74 | HR 90 | Temp 98.6°F | Ht 68.0 in | Wt 266.0 lb

## 2023-01-03 DIAGNOSIS — I10 Essential (primary) hypertension: Secondary | ICD-10-CM

## 2023-01-03 DIAGNOSIS — G8929 Other chronic pain: Secondary | ICD-10-CM

## 2023-01-03 DIAGNOSIS — Z7985 Long-term (current) use of injectable non-insulin antidiabetic drugs: Secondary | ICD-10-CM

## 2023-01-03 DIAGNOSIS — E119 Type 2 diabetes mellitus without complications: Secondary | ICD-10-CM | POA: Diagnosis not present

## 2023-01-03 DIAGNOSIS — M545 Low back pain, unspecified: Secondary | ICD-10-CM | POA: Diagnosis not present

## 2023-01-03 DIAGNOSIS — F411 Generalized anxiety disorder: Secondary | ICD-10-CM | POA: Diagnosis not present

## 2023-01-03 DIAGNOSIS — F3342 Major depressive disorder, recurrent, in full remission: Secondary | ICD-10-CM | POA: Diagnosis not present

## 2023-01-03 DIAGNOSIS — Z23 Encounter for immunization: Secondary | ICD-10-CM

## 2023-01-03 DIAGNOSIS — E1140 Type 2 diabetes mellitus with diabetic neuropathy, unspecified: Secondary | ICD-10-CM

## 2023-01-03 DIAGNOSIS — M170 Bilateral primary osteoarthritis of knee: Secondary | ICD-10-CM | POA: Diagnosis not present

## 2023-01-03 DIAGNOSIS — G894 Chronic pain syndrome: Secondary | ICD-10-CM | POA: Diagnosis not present

## 2023-01-03 LAB — POCT GLYCOSYLATED HEMOGLOBIN (HGB A1C)
HbA1c POC (<> result, manual entry): 5.6 % (ref 4.0–5.6)
HbA1c, POC (controlled diabetic range): 5.6 % (ref 0.0–7.0)
HbA1c, POC (prediabetic range): 5.6 % — AB (ref 5.7–6.4)
Hemoglobin A1C: 5.6 % (ref 4.0–5.6)

## 2023-01-03 MED ORDER — HYDROCHLOROTHIAZIDE 25 MG PO TABS: 25.0000 mg | ORAL_TABLET | Freq: Every day | ORAL | 1 refills | Status: DC

## 2023-01-03 MED ORDER — LISINOPRIL 10 MG PO TABS: 10.0000 mg | ORAL_TABLET | Freq: Every day | ORAL | 1 refills | Status: DC

## 2023-01-03 MED ORDER — ESCITALOPRAM OXALATE 10 MG PO TABS: ORAL_TABLET | ORAL | 1 refills | Status: DC

## 2023-01-03 MED ORDER — MORPHINE SULFATE ER 30 MG PO TBCR: 30.0000 mg | EXTENDED_RELEASE_TABLET | Freq: Three times a day (TID) | ORAL | 0 refills | Status: DC

## 2023-01-03 MED ORDER — OXYCODONE HCL 10 MG PO TABS: ORAL_TABLET | ORAL | 0 refills | Status: DC

## 2023-01-03 MED ORDER — METOPROLOL SUCCINATE ER 50 MG PO TB24: ORAL_TABLET | ORAL | 1 refills | Status: DC

## 2023-01-03 MED ORDER — CLONAZEPAM 0.5 MG PO TABS: ORAL_TABLET | ORAL | 5 refills | Status: DC

## 2023-01-03 NOTE — Telephone Encounter (Signed)
Meds reordered during appt today, please decline
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
## 2023-01-04 ENCOUNTER — Encounter: Payer: Self-pay | Admitting: Family Medicine

## 2023-01-04 LAB — BASIC METABOLIC PANEL
BUN: 25 mg/dL — ABNORMAL HIGH (ref 6–23)
CO2: 37 meq/L — ABNORMAL HIGH (ref 19–32)
Calcium: 10.1 mg/dL (ref 8.4–10.5)
Chloride: 89 meq/L — ABNORMAL LOW (ref 96–112)
Creatinine, Ser: 1.23 mg/dL (ref 0.40–1.50)
GFR: 59.61 mL/min — ABNORMAL LOW (ref >60.00)
Glucose, Bld: 107 mg/dL — ABNORMAL HIGH (ref 70–99)
Potassium: 4.4 meq/L (ref 3.5–5.1)
Sodium: 134 meq/L — ABNORMAL LOW (ref 135–145)

## 2023-01-05 NOTE — Telephone Encounter (Signed)
Hello Richard Levine are doing great. Your glucose levels remain normal and your kidney function is stable. The glucose labs have gradually improved over the last year. --PM

## 2023-01-16 ENCOUNTER — Encounter: Payer: Self-pay | Admitting: Family Medicine

## 2023-01-18 NOTE — Telephone Encounter (Signed)
No further action needed.

## 2023-01-18 NOTE — Telephone Encounter (Signed)
This is a topic we would need to discuss at an office visit.

## 2023-01-31 ENCOUNTER — Other Ambulatory Visit: Payer: Self-pay | Admitting: Family Medicine

## 2023-01-31 NOTE — Telephone Encounter (Signed)
Okay morphine prescription sent

## 2023-02-01 ENCOUNTER — Other Ambulatory Visit: Payer: Self-pay | Admitting: Family Medicine

## 2023-02-01 NOTE — Telephone Encounter (Signed)
Oxycodone rx sent.

## 2023-02-12 ENCOUNTER — Telehealth (INDEPENDENT_AMBULATORY_CARE_PROVIDER_SITE_OTHER): Payer: PPO | Admitting: Family Medicine

## 2023-02-12 ENCOUNTER — Encounter: Payer: Self-pay | Admitting: Family Medicine

## 2023-02-12 DIAGNOSIS — I10 Essential (primary) hypertension: Secondary | ICD-10-CM

## 2023-02-12 DIAGNOSIS — F3342 Major depressive disorder, recurrent, in full remission: Secondary | ICD-10-CM | POA: Diagnosis not present

## 2023-02-12 DIAGNOSIS — Z79899 Other long term (current) drug therapy: Secondary | ICD-10-CM | POA: Diagnosis not present

## 2023-02-12 DIAGNOSIS — E119 Type 2 diabetes mellitus without complications: Secondary | ICD-10-CM | POA: Diagnosis not present

## 2023-02-12 DIAGNOSIS — G894 Chronic pain syndrome: Secondary | ICD-10-CM | POA: Diagnosis not present
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
## 2023-03-02 ENCOUNTER — Telehealth: Payer: Self-pay | Admitting: Family Medicine

## 2023-03-02 MED ORDER — MORPHINE SULFATE ER 30 MG PO TBCR: 30.0000 mg | EXTENDED_RELEASE_TABLET | Freq: Three times a day (TID) | ORAL | 0 refills | Status: DC

## 2023-03-02 MED ORDER — OXYCODONE HCL 10 MG PO TABS: ORAL_TABLET | ORAL | 0 refills | Status: DC

## 2023-03-02 MED ORDER — LISINOPRIL 10 MG PO TABS: 10.0000 mg | ORAL_TABLET | Freq: Every day | ORAL | 1 refills | Status: DC

## 2023-03-02 MED ORDER — HYDROCHLOROTHIAZIDE 25 MG PO TABS: 25.0000 mg | ORAL_TABLET | Freq: Every day | ORAL | 1 refills | Status: DC

## 2023-03-02 NOTE — Telephone Encounter (Signed)
Prescription Request  03/02/2023  LOV: 01/03/2023  What is the name of the medication or equipment?   hydrochlorothiazide (HYDRODIURIL) 25 MG tablet  lisinopril (ZESTRIL) 10 MG tablet  morphine (MS CONTIN) 30 MG 12 hr tablet  Oxycodone HCl 10 MG TABS   Have you contacted your pharmacy to request a refill? Yes   Which pharmacy would you like this sent to?  [facility] [place], [place] - [address] [place] [address] Phone: [phone] Fax: [phone]    Patient notified that their request is being sent to the clinical staff for review and that they should receive a response within 2 business days.   Please advise at Mobile [phone] (mobile)

## 2023-03-02 NOTE — Telephone Encounter (Signed)
Refill requested for Morphine and Oxycodone sent to [facility]

## 2023-03-02 NOTE — Telephone Encounter (Signed)
Okay, prescriptions sent

## 2023-03-12 ENCOUNTER — Encounter: Payer: Self-pay | Admitting: Family Medicine

## 2023-03-12 NOTE — Telephone Encounter (Signed)
 Care team updated and letter sent for eye exam notes.

## 2023-03-22 ENCOUNTER — Telehealth: Payer: Self-pay | Admitting: Family Medicine

## 2023-04-04 ENCOUNTER — Other Ambulatory Visit: Payer: Self-pay | Admitting: Family Medicine

## 2023-04-04 MED ORDER — MORPHINE SULFATE ER 30 MG PO TBCR: 30.0000 mg | EXTENDED_RELEASE_TABLET | Freq: Three times a day (TID) | ORAL | 0 refills | Status: DC

## 2023-04-04 MED ORDER — OXYCODONE HCL 10 MG PO TABS: ORAL_TABLET | ORAL | 0 refills | Status: DC

## 2023-04-04 NOTE — Telephone Encounter (Signed)
 Reason for CRM: re-fill request for  morphine (MS CONTIN) 30 MG 12 hr tablet and  Oxycodone HCl 10 MG TABS   Please fill, if appropriate.

## 2023-04-04 NOTE — Telephone Encounter (Signed)
 Rxs sent

## 2023-04-04 NOTE — Addendum Note (Signed)
 Addended by: Jeoffrey Massed on: 04/04/2023 03:11 PM   Modules accepted: Orders

## 2023-04-05 NOTE — Telephone Encounter (Signed)
 No further action needed.

## 2023-04-10 ENCOUNTER — Other Ambulatory Visit: Payer: Self-pay | Admitting: Family Medicine

## 2023-04-16 ENCOUNTER — Encounter: Payer: Self-pay | Admitting: Family Medicine

## 2023-04-18 ENCOUNTER — Ambulatory Visit (INDEPENDENT_AMBULATORY_CARE_PROVIDER_SITE_OTHER): Payer: PPO | Admitting: Family Medicine

## 2023-04-18 ENCOUNTER — Encounter: Payer: Self-pay | Admitting: Family Medicine

## 2023-04-18 VITALS — BP 126/82 | HR 89 | Wt 277.0 lb

## 2023-04-18 DIAGNOSIS — F411 Generalized anxiety disorder: Secondary | ICD-10-CM

## 2023-04-18 DIAGNOSIS — F410 Panic disorder [episodic paroxysmal anxiety] without agoraphobia: Secondary | ICD-10-CM

## 2023-04-18 DIAGNOSIS — Z23 Encounter for immunization: Secondary | ICD-10-CM

## 2023-04-18 MED ORDER — TRAZODONE HCL 50 MG PO TABS: ORAL_TABLET | ORAL | 1 refills | Status: DC

## 2023-04-18 NOTE — Progress Notes (Signed)
 Marland Kitchen
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
## 2023-05-03 ENCOUNTER — Other Ambulatory Visit: Payer: Self-pay | Admitting: Family Medicine

## 2023-05-04 ENCOUNTER — Encounter: Payer: Self-pay | Admitting: Family Medicine

## 2023-05-04 ENCOUNTER — Telehealth (INDEPENDENT_AMBULATORY_CARE_PROVIDER_SITE_OTHER): Payer: PPO | Admitting: Family Medicine

## 2023-05-04 VITALS — BP 121/84

## 2023-05-04 DIAGNOSIS — K529 Noninfective gastroenteritis and colitis, unspecified: Secondary | ICD-10-CM

## 2023-05-04 NOTE — Telephone Encounter (Signed)
 No further action needed.
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
## 2023-05-30 ENCOUNTER — Other Ambulatory Visit: Payer: Self-pay | Admitting: Family Medicine

## 2023-05-30 NOTE — Telephone Encounter (Signed)
 Requesting: morphine 30mg  Contract: 10/31/21 UDS: 02/08/22 Last Visit: 04/18/23 Next Visit: 08/01/23 AWV Last Refill: 05/03/23 (90,0)  Requesting: oxycodone 10mg  Contract: 10/31/21 UDS: 02/08/22 Last Visit: 04/18/23 Next Visit: 08/01/23 AWV Last Refill: 05/03/23 (90,0)  Please Advise. Meds pending

## 2023-06-05 ENCOUNTER — Other Ambulatory Visit: Payer: Self-pay | Admitting: Family Medicine

## 2023-06-13 ENCOUNTER — Encounter: Payer: Self-pay | Admitting: Family Medicine

## 2023-06-13 MED ORDER — TRAZODONE HCL 50 MG PO TABS: ORAL_TABLET | ORAL | 5 refills | Status: DC

## 2023-06-13 NOTE — Telephone Encounter (Signed)
Trazodone prescription sent 

## 2023-06-28 ENCOUNTER — Other Ambulatory Visit: Payer: Self-pay | Admitting: Family Medicine

## 2023-06-29 NOTE — Telephone Encounter (Signed)
 Requesting: oxycodone Contract: 10/31/21 UDS: 02/18/22 Last Visit: 05/04/23 Next Visit: 08/01/23 AWV Last Refill: 05/30/23 (90,0)  Please Advise. Med pending

## 2023-07-01 ENCOUNTER — Encounter: Payer: Self-pay | Admitting: Family Medicine

## 2023-07-02 ENCOUNTER — Other Ambulatory Visit: Payer: Self-pay | Admitting: Family Medicine

## 2023-07-02 MED ORDER — OXYCODONE HCL 10 MG PO TABS: ORAL_TABLET | ORAL | 0 refills | Status: DC

## 2023-07-02 MED ORDER — MORPHINE SULFATE ER 30 MG PO TBCR: 30.0000 mg | EXTENDED_RELEASE_TABLET | Freq: Three times a day (TID) | ORAL | 0 refills | Status: DC

## 2023-07-02 NOTE — Telephone Encounter (Signed)
 See message below. Waiting for PCP to inform if ok to schedule this week

## 2023-07-02 NOTE — Telephone Encounter (Signed)
 Requesting: clonazepam Contract: 10/31/21 UDS: 02/08/22 Last Visit: 05/04/23 virtual Next Visit: 08/01/23 AWV Last Refill: 01/03/23 (60,5)  Requesting: morphine Contract: 10/31/21 UDS: 02/08/22 Last Visit: 05/04/23 virtual Next Visit: 08/01/23 AWV Last Refill: 07/02/23 (90,0)  Please Advise. Med pending

## 2023-07-03 ENCOUNTER — Encounter: Payer: Self-pay | Admitting: Family Medicine

## 2023-07-03 ENCOUNTER — Encounter: Payer: Self-pay | Admitting: Neurology

## 2023-07-03 ENCOUNTER — Telehealth (INDEPENDENT_AMBULATORY_CARE_PROVIDER_SITE_OTHER): Admitting: Family Medicine

## 2023-07-03 VITALS — BP 123/85

## 2023-07-03 DIAGNOSIS — F5101 Primary insomnia: Secondary | ICD-10-CM

## 2023-07-03 DIAGNOSIS — F411 Generalized anxiety disorder: Secondary | ICD-10-CM | POA: Diagnosis not present

## 2023-07-03 DIAGNOSIS — R200 Anesthesia of skin: Secondary | ICD-10-CM | POA: Diagnosis not present

## 2023-07-03 DIAGNOSIS — R202 Paresthesia of skin: Secondary | ICD-10-CM
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
## 2023-07-03 NOTE — Telephone Encounter (Signed)
 Pt has a scheduled appt with PCP today. Request for Clonazepam has been sent to provider and approved for refill.

## 2023-07-23 ENCOUNTER — Telehealth: Admitting: Family Medicine

## 2023-07-25 DIAGNOSIS — L8992 Pressure ulcer of unspecified site, stage 2: Secondary | ICD-10-CM | POA: Diagnosis not present

## 2023-07-25 DIAGNOSIS — R030 Elevated blood-pressure reading, without diagnosis of hypertension: Secondary | ICD-10-CM | POA: Diagnosis not present

## 2023-07-26 ENCOUNTER — Ambulatory Visit: Admitting: Family Medicine

## 2023-07-26 ENCOUNTER — Encounter: Payer: Self-pay | Admitting: Family Medicine

## 2023-07-26 VITALS — BP 112/67 | HR 77 | Temp 98.9°F | Ht 68.0 in | Wt 252.0 lb

## 2023-07-26 DIAGNOSIS — F5101 Primary insomnia: Secondary | ICD-10-CM

## 2023-07-26 DIAGNOSIS — S31829D Unspecified open wound of left buttock, subsequent encounter: Secondary | ICD-10-CM

## 2023-07-26 DIAGNOSIS — L304 Erythema intertrigo: Secondary | ICD-10-CM | POA: Diagnosis not present

## 2023-07-26 MED ORDER — FLUCONAZOLE 150 MG PO TABS: ORAL_TABLET | ORAL | 0 refills | Status: DC

## 2023-07-26 MED ORDER — TRAZODONE HCL 50 MG PO TABS
ORAL_TABLET | ORAL | 5 refills | Status: AC
Start: 2023-07-26 — End: ?
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
## 2023-07-27 ENCOUNTER — Ambulatory Visit: Admitting: Family Medicine

## 2023-07-31 ENCOUNTER — Encounter: Payer: Self-pay | Admitting: Family Medicine

## 2023-07-31 ENCOUNTER — Other Ambulatory Visit: Payer: Self-pay | Admitting: Family Medicine

## 2023-07-31 ENCOUNTER — Ambulatory Visit: Admitting: Family Medicine

## 2023-07-31 VITALS — BP 129/72 | HR 76 | Wt 247.8 lb

## 2023-07-31 DIAGNOSIS — S31829D Unspecified open wound of left buttock, subsequent encounter: Secondary | ICD-10-CM | POA: Diagnosis not present

## 2023-07-31 DIAGNOSIS — F5101 Primary insomnia: Secondary | ICD-10-CM

## 2023-07-31 DIAGNOSIS — L304 Erythema intertrigo: Secondary | ICD-10-CM | POA: Diagnosis not present

## 2023-07-31 DIAGNOSIS — L219 Seborrheic dermatitis, unspecified: Secondary | ICD-10-CM | POA: Diagnosis not present

## 2023-07-31 MED ORDER — MORPHINE SULFATE ER 30 MG PO TBCR: 30.0000 mg | EXTENDED_RELEASE_TABLET | Freq: Three times a day (TID) | ORAL | 0 refills | Status: DC

## 2023-07-31 MED ORDER — OXYCODONE HCL 10 MG PO TABS: ORAL_TABLET | ORAL | 0 refills | Status: DC

## 2023-07-31 MED ORDER — KETOCONAZOLE 1 % EX SHAM: MEDICATED_SHAMPOO | CUTANEOUS | 3 refills | Status: DC
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
## 2023-07-31 NOTE — Telephone Encounter (Signed)
 Requesting: morphine  Contract: 10/31/21 UDS: 02/08/22 Last Visit: 07/31/23 Next Visit: advised to f/u 2 weeks Last Refill: 07/02/23 (90,0)  Requesting: oxycodone  Contract: 10/31/21 UDS: 02/08/22 Last Visit: 07/31/23 Next Visit: advised to f/u 2 weeks Last Refill: 07/02/23 (90,0)  Please Advise. Rx pending

## 2023-08-01 ENCOUNTER — Ambulatory Visit: Payer: PPO

## 2023-08-01 VITALS — BP 120/87 | Ht 68.0 in | Wt 247.0 lb

## 2023-08-01 DIAGNOSIS — E119 Type 2 diabetes mellitus without complications: Secondary | ICD-10-CM | POA: Diagnosis not present

## 2023-08-01 DIAGNOSIS — Z2821 Immunization not carried out because of patient refusal: Secondary | ICD-10-CM | POA: Diagnosis not present

## 2023-08-01 DIAGNOSIS — Z0000 Encounter for general adult medical examination without abnormal findings: Secondary | ICD-10-CM
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
## 2023-08-01 NOTE — Patient Instructions (Signed)
 Mr. Poorman , Thank you for taking time to come for your Medicare Wellness Visit. I appreciate your ongoing commitment to your health goals. Please review the following plan we discussed and let me know if I can assist you in the future.   Referrals/Orders/Follow-Ups/Clinician Recommendations: follow up as scheduled for next AWV.  This is a list of the screening recommended for you and due dates:  Health Maintenance  Topic Date Due   Zoster (Shingles) Vaccine (1 of 2) 10/22/1971   Eye exam for diabetics  03/15/2022   COVID-19 Vaccine (4 - 2024-25 season) 11/26/2022   Yearly kidney health urinalysis for diabetes  02/09/2023   Complete foot exam   02/09/2023   Hemoglobin A1C  07/04/2023   DTaP/Tdap/Td vaccine (1 - Tdap) 04/17/2024*   Flu Shot  10/26/2023   Yearly kidney function blood test for diabetes  01/03/2024   Medicare Annual Wellness Visit  07/31/2024   Colon Cancer Screening  03/31/2026   Pneumonia Vaccine  Completed   HPV Vaccine  Aged Out   Meningitis B Vaccine  Aged Out   Hepatitis C Screening  Discontinued  *Topic was postponed. The date shown is not the original due date.    Advanced directives: (Declined) Advance directive discussed with you today. Even though you declined this today, please call our office should you change your mind, and we can give you the proper paperwork for you to fill out.  Next Medicare Annual Wellness Visit scheduled for next year: Yes  Have you seen your provider in the last 6 months (3 months if uncontrolled diabetes)? Yes

## 2023-08-03 ENCOUNTER — Encounter: Payer: Self-pay | Admitting: Family Medicine

## 2023-08-03 NOTE — Telephone Encounter (Signed)
 Yes Home health nursing still needed for wound care

## 2023-08-03 NOTE — Telephone Encounter (Signed)
 Any luck with [facility] agencies yet? Just checking status for pt

## 2023-08-03 NOTE — Telephone Encounter (Signed)
 FYI, please advise if [facility] still needed

## 2023-08-05 ENCOUNTER — Other Ambulatory Visit: Payer: Self-pay | Admitting: Family Medicine

## 2023-08-10 ENCOUNTER — Telehealth: Payer: Self-pay

## 2023-08-10 NOTE — Telephone Encounter (Signed)
 Spoke with Alicia, approval given for delayed start of care.

## 2023-08-10 NOTE — Telephone Encounter (Signed)
 Yes okay

## 2023-08-10 NOTE — Telephone Encounter (Signed)
 Copied from CRM [id number]. Topic: Clinical - Home Health Verbal Orders >> Aug 10, 2023 11:26 AM Dewanda Foots wrote: Caller/Agency: Evalyn Hillier from [facility] Callback Number: [phone] Service Requested: Skilled Nursing Frequency: Unsure yet-clinician would do the eval and decide from there Any new concerns about the patient? No  Evalyn Hillier wanted verbal orders from PCP to do delay of care for patient for Monday 08/13/23.

## 2023-08-13 DIAGNOSIS — K760 Fatty (change of) liver, not elsewhere classified: Secondary | ICD-10-CM | POA: Diagnosis not present

## 2023-08-13 DIAGNOSIS — E785 Hyperlipidemia, unspecified: Secondary | ICD-10-CM | POA: Diagnosis not present

## 2023-08-13 DIAGNOSIS — K5909 Other constipation: Secondary | ICD-10-CM | POA: Diagnosis not present

## 2023-08-13 DIAGNOSIS — Z79891 Long term (current) use of opiate analgesic: Secondary | ICD-10-CM | POA: Diagnosis not present

## 2023-08-13 DIAGNOSIS — Z7985 Long-term (current) use of injectable non-insulin antidiabetic drugs: Secondary | ICD-10-CM | POA: Diagnosis not present

## 2023-08-13 DIAGNOSIS — N182 Chronic kidney disease, stage 2 (mild): Secondary | ICD-10-CM | POA: Diagnosis not present

## 2023-08-13 DIAGNOSIS — F5101 Primary insomnia: Secondary | ICD-10-CM | POA: Diagnosis not present

## 2023-08-13 DIAGNOSIS — L89322 Pressure ulcer of left buttock, stage 2: Secondary | ICD-10-CM | POA: Diagnosis not present

## 2023-08-13 DIAGNOSIS — I872 Venous insufficiency (chronic) (peripheral): Secondary | ICD-10-CM | POA: Diagnosis not present

## 2023-08-13 DIAGNOSIS — K219 Gastro-esophageal reflux disease without esophagitis: Secondary | ICD-10-CM | POA: Diagnosis not present

## 2023-08-13 DIAGNOSIS — M150 Primary generalized (osteo)arthritis: Secondary | ICD-10-CM | POA: Diagnosis not present

## 2023-08-13 DIAGNOSIS — S31829A Unspecified open wound of left buttock, initial encounter: Secondary | ICD-10-CM | POA: Diagnosis not present

## 2023-08-13 DIAGNOSIS — I129 Hypertensive chronic kidney disease with stage 1 through stage 4 chronic kidney disease, or unspecified chronic kidney disease: Secondary | ICD-10-CM | POA: Diagnosis not present

## 2023-08-13 DIAGNOSIS — L304 Erythema intertrigo: Secondary | ICD-10-CM | POA: Diagnosis not present

## 2023-08-13 DIAGNOSIS — Z7982 Long term (current) use of aspirin: Secondary | ICD-10-CM | POA: Diagnosis not present

## 2023-08-13 DIAGNOSIS — E1142 Type 2 diabetes mellitus with diabetic polyneuropathy: Secondary | ICD-10-CM | POA: Diagnosis not present

## 2023-08-13 DIAGNOSIS — G894 Chronic pain syndrome: Secondary | ICD-10-CM | POA: Diagnosis not present

## 2023-08-13 DIAGNOSIS — F419 Anxiety disorder, unspecified: Secondary | ICD-10-CM | POA: Diagnosis not present

## 2023-08-13 DIAGNOSIS — F32A Depression, unspecified: Secondary | ICD-10-CM | POA: Diagnosis not present

## 2023-08-13 DIAGNOSIS — L219 Seborrheic dermatitis, unspecified: Secondary | ICD-10-CM | POA: Diagnosis not present

## 2023-08-13 DIAGNOSIS — R569 Unspecified convulsions: Secondary | ICD-10-CM | POA: Diagnosis not present

## 2023-08-13 DIAGNOSIS — E1122 Type 2 diabetes mellitus with diabetic chronic kidney disease: Secondary | ICD-10-CM | POA: Diagnosis not present

## 2023-08-14 ENCOUNTER — Other Ambulatory Visit: Payer: Self-pay | Admitting: Family Medicine

## 2023-08-14 NOTE — Telephone Encounter (Signed)
Pt has OV tomorrow.

## 2023-08-15 ENCOUNTER — Encounter: Payer: Self-pay | Admitting: Family Medicine

## 2023-08-15 ENCOUNTER — Ambulatory Visit (INDEPENDENT_AMBULATORY_CARE_PROVIDER_SITE_OTHER): Admitting: Family Medicine

## 2023-08-15 VITALS — BP 100/63 | HR 90 | Temp 98.6°F | Wt 245.6 lb

## 2023-08-15 DIAGNOSIS — G894 Chronic pain syndrome: Secondary | ICD-10-CM | POA: Diagnosis not present

## 2023-08-15 DIAGNOSIS — Z7985 Long-term (current) use of injectable non-insulin antidiabetic drugs: Secondary | ICD-10-CM | POA: Diagnosis not present

## 2023-08-15 DIAGNOSIS — G8929 Other chronic pain: Secondary | ICD-10-CM

## 2023-08-15 DIAGNOSIS — M170 Bilateral primary osteoarthritis of knee: Secondary | ICD-10-CM

## 2023-08-15 DIAGNOSIS — E119 Type 2 diabetes mellitus without complications: Secondary | ICD-10-CM | POA: Diagnosis not present

## 2023-08-15 DIAGNOSIS — I10 Essential (primary) hypertension: Secondary | ICD-10-CM

## 2023-08-15 DIAGNOSIS — M25562 Pain in left knee: Secondary | ICD-10-CM | POA: Diagnosis not present

## 2023-08-15 DIAGNOSIS — M545 Low back pain, unspecified: Secondary | ICD-10-CM | POA: Diagnosis not present

## 2023-08-15 DIAGNOSIS — M25561 Pain in right knee: Secondary | ICD-10-CM

## 2023-08-15 DIAGNOSIS — F411 Generalized anxiety disorder: Secondary | ICD-10-CM | POA: Diagnosis not present

## 2023-08-15 LAB — COMPREHENSIVE METABOLIC PANEL WITH GFR
ALT: 16 U/L (ref 0–53)
AST: 17 U/L (ref 0–37)
Albumin: 4.2 g/dL (ref 3.5–5.2)
Alkaline Phosphatase: 60 U/L (ref 39–117)
BUN: 20 mg/dL (ref 6–23)
CO2: 32 meq/L (ref 19–32)
Calcium: 9.2 mg/dL (ref 8.4–10.5)
Chloride: 91 meq/L — ABNORMAL LOW (ref 96–112)
Creatinine, Ser: 1.15 mg/dL (ref 0.40–1.50)
GFR: 64.34 mL/min (ref >60.00)
Glucose, Bld: 96 mg/dL (ref 70–99)
Potassium: 3.7 meq/L (ref 3.5–5.1)
Sodium: 133 meq/L — ABNORMAL LOW (ref 135–145)
Total Bilirubin: 0.6 mg/dL (ref 0.2–1.2)
Total Protein: 7.3 g/dL (ref 6.0–8.3)

## 2023-08-15 LAB — HEMOGLOBIN A1C: Hgb A1c MFr Bld: 5.9 % (ref 4.6–6.5)

## 2023-08-15 MED ORDER — BUSPIRONE HCL 15 MG PO TABS: 15.0000 mg | ORAL_TABLET | Freq: Two times a day (BID) | ORAL | 0 refills | Status: DC

## 2023-08-15 NOTE — Telephone Encounter (Signed)
 Placed [facility] order # [id number] on PCP desk to review and sign, if appropriate.

## 2023-08-15 NOTE — Telephone Encounter (Signed)
 Signed and put in box to go up front. Signed:  Arletha Lady, MD           08/15/2023
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
## 2023-08-16 ENCOUNTER — Encounter: Payer: Self-pay | Admitting: Family Medicine

## 2023-08-16 ENCOUNTER — Ambulatory Visit: Payer: Self-pay | Admitting: Family Medicine

## 2023-08-16 NOTE — Telephone Encounter (Signed)
 Copied from CRM [id number]. Topic: General - Other >> Aug 16, 2023  1:35 PM Howard Macho wrote: Reason for CRM: patient called stating he was sent lab results through mychart and some of the results were abnormal. Patient wanted a complete summary because some of things were not completed when it was sent to MyChart. patient stated he did necessary need a call back but a mychart message sent to him    LVM for pt to return call BDS 08/16/23 3:50PM

## 2023-08-16 NOTE — Telephone Encounter (Signed)
 Patient returned call, confirmed Dr.McGowen has not reviewed the results just yet but we will follow up once he does. Patient voiced understanding and would like MyChart message sent once reviewed by PCP.

## 2023-08-16 NOTE — Telephone Encounter (Signed)
 No further action needed at this time.

## 2023-08-17 NOTE — Telephone Encounter (Signed)
 No further action needed at this time.

## 2023-08-20 ENCOUNTER — Encounter: Payer: Self-pay | Admitting: Family Medicine

## 2023-08-21 ENCOUNTER — Telehealth: Payer: Self-pay

## 2023-08-21 NOTE — Telephone Encounter (Signed)
 BP 150s over 80s when very anxious is ok/to be expected.  Increase buspar to TWO of the 15mg  tabs twice a day.  Keep follow-up appointment set for 08/29/2023

## 2023-08-21 NOTE — Telephone Encounter (Signed)
 No further action needed.

## 2023-08-21 NOTE — Addendum Note (Signed)
 Addended by: Terris Fickle D on: 08/21/2023 04:28 PM   Modules accepted: Orders

## 2023-08-21 NOTE — Telephone Encounter (Addendum)
-----   Message from [facility] Sherian Dimitri S sent at 08/21/2023  2:17 PM EDT ----- Home health [phone]   Home Health Certificate (Order ID [id number]), to be filled out by provider. Patient requested to send it back via Fax within 7-days. Document is located in providers s drive folder      Placed on PCP desk to review and sign, if appropriate.

## 2023-08-21 NOTE — Telephone Encounter (Signed)
 Home Health Certificate (Order ID [id number]), to be filled out by provider. Patient requested to send it back via Fax within 7-days. Document is located in providers s drive folder .

## 2023-08-21 NOTE — Telephone Encounter (Signed)
 MyChart message read by pt.
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
## 2023-08-29 ENCOUNTER — Encounter: Payer: Self-pay | Admitting: Family Medicine

## 2023-08-29 ENCOUNTER — Ambulatory Visit (INDEPENDENT_AMBULATORY_CARE_PROVIDER_SITE_OTHER): Admitting: Family Medicine

## 2023-08-29 VITALS — BP 93/63 | HR 97 | Resp 12 | Ht 68.0 in | Wt 244.4 lb

## 2023-08-29 DIAGNOSIS — L304 Erythema intertrigo: Secondary | ICD-10-CM | POA: Diagnosis not present

## 2023-08-29 DIAGNOSIS — I952 Hypotension due to drugs: Secondary | ICD-10-CM

## 2023-08-29 DIAGNOSIS — F411 Generalized anxiety disorder: Secondary | ICD-10-CM | POA: Diagnosis not present

## 2023-08-29 DIAGNOSIS — R21 Rash and other nonspecific skin eruption: Secondary | ICD-10-CM | POA: Diagnosis not present

## 2023-08-29 DIAGNOSIS — I10 Essential (primary) hypertension: Secondary | ICD-10-CM | POA: Diagnosis not present

## 2023-08-29 MED ORDER — OXYCODONE HCL 10 MG PO TABS: ORAL_TABLET | ORAL | 0 refills | Status: DC

## 2023-08-29 MED ORDER — MORPHINE SULFATE ER 30 MG PO TBCR: 30.0000 mg | EXTENDED_RELEASE_TABLET | Freq: Three times a day (TID) | ORAL | 0 refills | Status: DC

## 2023-08-29 MED ORDER — BUSPIRONE HCL 30 MG PO TABS
30.0000 mg | ORAL_TABLET | Freq: Two times a day (BID) | ORAL | 5 refills | Status: AC
Start: 2023-08-29 — End: ?

## 2023-08-29 NOTE — Telephone Encounter (Signed)
 No further action needed.
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
## 2023-08-29 NOTE — Patient Instructions (Signed)
 Stop metoprolol .  I sent in a prescription for BuSpar  30 mg tabs.  You will take 1 tab twice a day.

## 2023-08-30 ENCOUNTER — Other Ambulatory Visit: Payer: Self-pay | Admitting: Pharmacist

## 2023-08-30 ENCOUNTER — Encounter: Payer: Self-pay | Admitting: Pharmacist

## 2023-08-30 ENCOUNTER — Telehealth: Payer: Self-pay

## 2023-08-30 MED ORDER — MOUNJARO 7.5 MG/0.5ML ~~LOC~~ SOAJ: 7.5000 mg | SUBCUTANEOUS | 5 refills | Status: DC

## 2023-08-30 NOTE — Telephone Encounter (Signed)
 Copied from CRM [id number]. Topic: Clinical - Medication Question >> Aug 29, 2023  1:46 PM Abigail D wrote: Reason for CRM: Heidi Llamas with [facility] calling as patient previously was getting busPIRone  (BUSPAR ) 15 MG tablet, 30mg  daily is a high dose and the pharmacist is concerned as it is a big jump from the previously prescribed 15mg . She is confirming this is what Dr. Johnette Naegeli wants. [phone]   Confirmed with pharmacy per provider instructions, 30mg  bid is correct.

## 2023-08-30 NOTE — Progress Notes (Signed)
 Pharmacy Quality Measure Review  This patient is appearing on a report for being at risk of failing the adherence measure for diabetes medications this calendar year.   Medication: Mounjaro  7.5mg  Last fill date: 05/17/2023 for 28 day supply per adherence report but per Dr Anson Basta database Mounjaro  7.5mg  was filled 07/27/2023 and picked up 07/30/2023  Patient should be due to refill Mounjaro  again soon Left voicemail for patient to return my call at their convenience. and Will collaborate with provider to facilitate refill needs.  Cecilie Coffee, PharmD Clinical Pharmacist [facility]  Population Health [phone]

## 2023-08-31 ENCOUNTER — Ambulatory Visit: Admitting: Neurology

## 2023-08-31 ENCOUNTER — Other Ambulatory Visit

## 2023-08-31 ENCOUNTER — Encounter: Payer: Self-pay | Admitting: Neurology

## 2023-08-31 VITALS — BP 104/62 | HR 120 | Ht 68.0 in | Wt 246.0 lb

## 2023-08-31 DIAGNOSIS — G629 Polyneuropathy, unspecified: Secondary | ICD-10-CM

## 2023-08-31 DIAGNOSIS — G8929 Other chronic pain: Secondary | ICD-10-CM | POA: Diagnosis not present

## 2023-08-31 DIAGNOSIS — R269 Unspecified abnormalities of gait and mobility: Secondary | ICD-10-CM

## 2023-08-31 DIAGNOSIS — M545 Low back pain, unspecified: Secondary | ICD-10-CM | POA: Diagnosis not present

## 2023-08-31 DIAGNOSIS — M5416 Radiculopathy, lumbar region: Secondary | ICD-10-CM

## 2023-08-31 DIAGNOSIS — M21371 Foot drop, right foot: Secondary | ICD-10-CM | POA: Diagnosis not present

## 2023-08-31 NOTE — Addendum Note (Signed)
 Addended by: Arturo Bing on: 08/31/2023 02:59 PM   Modules accepted: Orders

## 2023-08-31 NOTE — Patient Instructions (Addendum)
 I saw you today for nerve damage in your legs. It is likely due to prior back injuries and maybe some neuropathy.  I will send some labs today to look for other contributing factors.  I will refer you to physical therapy to help your walking.  Alpha lipoic acid 600mg  daily has some research data suggesting it helps with nerve health. No major side effects other than <1% of people report upset stomach. This can be taken twice per day (1200mg  daily) if no relief obtained. You can buy this over the counter or online.  The physicians and staff at [name] are committed to providing excellent care. You may receive a survey requesting feedback about your experience at our office. We strive to receive "very good" responses to the survey questions. If you feel that your experience would prevent you from giving the office a "very good " response, please contact our office to try to remedy the situation. We may be reached at [phone]. Thank you for taking the time out of your busy day to complete the survey.  Richard Coats, MD [facility]  Preventing Falls at [facility] are common, often dreaded events in the lives of older people. Aside from the obvious injuries and even death that may result, fall can cause wide-ranging consequences including loss of independence, mental decline, decreased activity and mobility. Younger people are also at risk of falling, especially those with chronic illnesses and fatigue.  Ways to reduce risk for falling Examine diet and medications. Warm foods and alcohol dilate blood vessels, which can lead to dizziness when standing. Sleep aids, antidepressants and pain medications can also increase the likelihood of a fall.  Get a vision exam. Poor vision, cataracts and glaucoma increase the chances of falling.  Check foot gear. Shoes should fit snugly and have a sturdy, nonskid sole and a broad, low heel  Participate in a physician-approved exercise program  to build and maintain muscle strength and improve balance and coordination. Programs that use ankle weights or stretch bands are excellent for muscle-strengthening. Water  aerobics programs and low-impact Tai Chi programs have also been shown to improve balance and coordination.  Increase vitamin D intake. Vitamin D improves muscle strength and increases the amount of calcium  the body is able to absorb and deposit in bones.  How to prevent falls from common hazards Floors - Remove all loose wires, cords, and throw rugs. Minimize clutter. Make sure rugs are anchored and smooth. Keep furniture in its usual place.  Chairs -- Use chairs with straight backs, armrests and firm seats. Add firm cushions to existing pieces to add height.  Bathroom - Install grab bars and non-skid tape in the tub or shower. Use a bathtub transfer bench or a shower chair with a back support Use an elevated toilet seat and/or safety rails to assist standing from a low surface. Do not use towel racks or bathroom tissue holders to help you stand.  Lighting - Make sure halls, stairways, and entrances are well-lit. Install a night light in your bathroom or hallway. Make sure there is a light switch at the top and bottom of the staircase. Turn lights on if you get up in the middle of the night. Make sure lamps or light switches are within reach of the bed if you have to get up during the night.  Kitchen - Install non-skid rubber mats near the sink and stove. Clean spills immediately. Store frequently used utensils, pots, pans between waist and eye level.  This helps prevent reaching and bending. Sit when getting things out of lower cupboards.  Living room/ Bedrooms - Place furniture with wide spaces in between, giving enough room to move around. Establish a route through the living room that gives you something to hold onto as you walk.  Stairs - Make sure treads, rails, and rugs are secure. Install a rail on both sides of the stairs.  If stairs are a threat, it might be helpful to arrange most of your activities on the lower level to reduce the number of times you must climb the stairs.  Entrances and doorways - Install metal handles on the walls adjacent to the doorknobs of all doors to make it more secure as you travel through the doorway.  Tips for maintaining balance Keep at least one hand free at all times. Try using a backpack or fanny pack to hold things rather than carrying them in your hands. Never carry objects in both hands when walking as this interferes with keeping your balance.  Attempt to swing both arms from front to back while walking. This might require a conscious effort if Parkinson's disease has diminished your movement. It will, however, help you to maintain balance and posture, and reduce fatigue.  Consciously lift your feet off of the ground when walking. Shuffling and dragging of the feet is a common culprit in losing your balance.  When trying to navigate turns, use a "U" technique of facing forward and making a wide turn, rather than pivoting sharply.  Try to stand with your feet shoulder-length apart. When your feet are close together for any length of time, you increase your risk of losing your balance and falling.  Do one thing at a time. Don't try to walk and accomplish another task, such as reading or looking around. The decrease in your automatic reflexes complicates motor function, so the less distraction, the better.  Do not wear rubber or gripping soled shoes, they might "catch" on the floor and cause tripping.  Move slowly when changing positions. Use deliberate, concentrated movements and, if needed, use a grab bar or walking aid. Count 15 seconds between each movement. For example, when rising from a seated position, wait 15 seconds after standing to begin walking.  If balance is a continuous problem, you might want to consider a walking aid such as a cane, walking stick, or walker.  Once you've mastered walking with help, you might be ready to try it on your own again.

## 2023-09-06 ENCOUNTER — Other Ambulatory Visit: Payer: Self-pay

## 2023-09-06 ENCOUNTER — Ambulatory Visit: Payer: Self-pay | Admitting: Neurology

## 2023-09-06 DIAGNOSIS — D472 Monoclonal gammopathy: Secondary | ICD-10-CM

## 2023-09-06 LAB — FOLATE: Folate: 9.2 ng/mL

## 2023-09-06 LAB — IMMUNOFIXATION ELECTROPHORESIS
IgM, Serum: 170 mg/dL (ref 50–300)
IgM, Serum: 1192 mg/dL (ref 600–300)
Immunoglobulin A: 1192 mg/dL — ABNORMAL HIGH (ref 70–320)
Immunoglobulin A: 430 mg/dL — ABNORMAL HIGH (ref 70–320)

## 2023-09-06 LAB — VITAMIN B12: Vitamin B-12: 353 pg/mL (ref 200–1100)

## 2023-09-06 LAB — VITAMIN B1: Vitamin B1 (Thiamine): 6 nmol/L — ABNORMAL LOW (ref 8–30)

## 2023-09-06 NOTE — Telephone Encounter (Signed)
Pt. Calling back for results.

## 2023-09-07 ENCOUNTER — Encounter: Payer: Self-pay | Admitting: Family Medicine

## 2023-09-07 ENCOUNTER — Ambulatory Visit: Admitting: Family Medicine

## 2023-09-11 ENCOUNTER — Inpatient Hospital Stay: Attending: Hematology | Admitting: Hematology

## 2023-09-11 ENCOUNTER — Encounter: Payer: Self-pay | Admitting: Hematology

## 2023-09-11 ENCOUNTER — Inpatient Hospital Stay

## 2023-09-11 VITALS — BP 136/89 | HR 123 | Temp 98.6°F | Resp 16 | Ht 70.0 in | Wt 246.7 lb

## 2023-09-11 DIAGNOSIS — M21371 Foot drop, right foot: Secondary | ICD-10-CM | POA: Insufficient documentation

## 2023-09-11 DIAGNOSIS — Z801 Family history of malignant neoplasm of trachea, bronchus and lung: Secondary | ICD-10-CM | POA: Insufficient documentation

## 2023-09-11 DIAGNOSIS — R768 Other specified abnormal immunological findings in serum: Secondary | ICD-10-CM | POA: Diagnosis not present

## 2023-09-11 DIAGNOSIS — R634 Abnormal weight loss: Secondary | ICD-10-CM | POA: Insufficient documentation

## 2023-09-11 DIAGNOSIS — Z7985 Long-term (current) use of injectable non-insulin antidiabetic drugs: Secondary | ICD-10-CM | POA: Diagnosis not present

## 2023-09-11 DIAGNOSIS — R7303 Prediabetes: Secondary | ICD-10-CM | POA: Insufficient documentation

## 2023-09-11 DIAGNOSIS — D472 Monoclonal gammopathy: Secondary | ICD-10-CM

## 2023-09-11 DIAGNOSIS — K5730 Diverticulosis of large intestine without perforation or abscess without bleeding: Secondary | ICD-10-CM | POA: Insufficient documentation

## 2023-09-11 LAB — CBC WITH DIFFERENTIAL/PLATELET
Abs Immature Granulocytes: 0.02 10*3/uL (ref 0.00–0.07)
Basophils Absolute: 0 10*3/uL (ref 0.0–0.1)
Basophils Relative: 0 %
Eosinophils Absolute: 0.1 10*3/uL (ref 0.0–0.5)
Eosinophils Relative: 2 %
HCT: 42.2 % (ref 39.0–52.0)
Hemoglobin: 15 g/dL (ref 13.0–17.0)
Immature Granulocytes: 0 %
Lymphocytes Relative: 19 %
Lymphs Abs: 1.5 10*3/uL (ref 0.7–4.0)
MCH: 33.1 pg (ref 26.0–34.0)
MCHC: 35.5 g/dL (ref 30.0–36.0)
MCV: 93.2 fL (ref 80.0–100.0)
Monocytes Absolute: 0.8 10*3/uL (ref 0.1–1.0)
Monocytes Relative: 10 %
Neutro Abs: 5.3 10*3/uL (ref 1.7–7.7)
Neutrophils Relative %: 69 %
Platelets: 231 10*3/uL (ref 150–400)
RBC: 4.53 MIL/uL (ref 4.22–5.81)
RDW: 12.5 % (ref 11.5–15.5)
WBC: 7.7 10*3/uL (ref 4.0–10.5)
nRBC: 0 % (ref 0.0–0.2)

## 2023-09-11 LAB — HEPATITIS C ANTIBODY: HCV Ab: NONREACTIVE

## 2023-09-11 LAB — HEPATITIS B SURFACE ANTIBODY,QUALITATIVE: Hep B S Ab: NONREACTIVE

## 2023-09-11 LAB — LACTATE DEHYDROGENASE: LDH: 138 U/L (ref 98–192)

## 2023-09-11 LAB — HEPATITIS B SURFACE ANTIGEN: Hepatitis B Surface Ag: NONREACTIVE

## 2023-09-11 LAB — HIV ANTIBODY (ROUTINE TESTING W REFLEX): HIV Screen 4th Generation wRfx: NONREACTIVE

## 2023-09-11 NOTE — Patient Instructions (Signed)
 You were seen and examined today by Dr. Ellin Saba. Dr. Ellin Saba is a hematologist, meaning that he specializes in blood abnormalities. Dr. Ellin Saba discussed your past medical history, family history of cancers/blood conditions and the events that led to you being here today.  You were referred to Dr. Ellin Saba due to an abnormal protein in the blood.  Dr. Ellin Saba has recommended additional labs today for further evaluation.  Follow-up as scheduled.
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
## 2023-09-12 ENCOUNTER — Ambulatory Visit: Admitting: Family Medicine

## 2023-09-12 ENCOUNTER — Ambulatory Visit (INDEPENDENT_AMBULATORY_CARE_PROVIDER_SITE_OTHER): Admitting: Family Medicine

## 2023-09-12 ENCOUNTER — Encounter: Payer: Self-pay | Admitting: Family Medicine

## 2023-09-12 VITALS — BP 103/72 | HR 107 | Temp 98.3°F | Ht 68.0 in | Wt 246.6 lb

## 2023-09-12 DIAGNOSIS — I10 Essential (primary) hypertension: Secondary | ICD-10-CM | POA: Diagnosis not present

## 2023-09-12 DIAGNOSIS — F411 Generalized anxiety disorder: Secondary | ICD-10-CM | POA: Diagnosis not present

## 2023-09-12 DIAGNOSIS — L304 Erythema intertrigo: Secondary | ICD-10-CM | POA: Diagnosis not present

## 2023-09-12 DIAGNOSIS — F5101 Primary insomnia: Secondary | ICD-10-CM | POA: Diagnosis not present

## 2023-09-12 LAB — KAPPA/LAMBDA LIGHT CHAINS
Kappa free light chain: 32.1 mg/L — ABNORMAL HIGH (ref 3.3–19.4)
Kappa, lambda light chain ratio: 1.37 (ref 0.26–1.65)
Lambda free light chains: 23.5 mg/L (ref 5.7–26.3)

## 2023-09-12 LAB — RHEUMATOID FACTOR: Rheumatoid fact SerPl-aCnc: <10 [IU]/mL (ref <14.0)

## 2023-09-12 LAB — HEPATITIS B CORE ANTIBODY, TOTAL: HEP B CORE AB: NEGATIVE

## 2023-09-12 LAB — BETA 2 MICROGLOBULIN, SERUM: Beta-2 Microglobulin: 2.4 mg/L (ref 0.6–2.4)

## 2023-09-12 MED ORDER — TRAZODONE HCL 50 MG PO TABS: ORAL_TABLET | ORAL | 5 refills | Status: DC
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
## 2023-09-13 LAB — PROTEIN ELECTROPHORESIS, SERUM
A/G Ratio: 1 (ref 0.7–1.7)
Albumin ELP: 3.5 g/dL (ref 2.9–4.4)
Alpha-1-Globulin: 0.2 g/dL (ref 0.0–0.4)
Alpha-2-Globulin: 0.9 g/dL (ref 0.4–1.0)
Beta Globulin: 1.2 g/dL (ref 0.7–1.3)
Gamma Globulin: 1.2 g/dL (ref 0.4–1.8)
Globulin, Total: 3.5 g/dL (ref 2.2–3.9)
M-Spike, %: 0.4 g/dL — ABNORMAL HIGH
Total Protein ELP: 7 g/dL (ref 6.0–8.5)

## 2023-09-14 LAB — IMMUNOFIXATION ELECTROPHORESIS
IgA: 420 mg/dL (ref 61–437)
IgG (Immunoglobin G), Serum: 1136 mg/dL (ref 603–1613)
IgM (Immunoglobulin M), Srm: 168 mg/dL (ref 20–172)
Total Protein ELP: 7.2 g/dL (ref 6.0–8.5)

## 2023-09-17 ENCOUNTER — Ambulatory Visit (HOSPITAL_COMMUNITY)
Admission: RE | Admit: 2023-09-17 | Discharge: 2023-09-17 | Disposition: A | Discharge status: Home / Self Care | Source: Ambulatory Visit | Attending: Hematology | Admitting: Hematology

## 2023-09-17 ENCOUNTER — Other Ambulatory Visit: Payer: Self-pay

## 2023-09-17 DIAGNOSIS — R768 Other specified abnormal immunological findings in serum: Secondary | ICD-10-CM | POA: Diagnosis not present

## 2023-09-17 DIAGNOSIS — D472 Monoclonal gammopathy: Secondary | ICD-10-CM

## 2023-09-19 LAB — UPEP/UIFE/LIGHT CHAINS/TP, 24-HR UR
% BETA, Urine: 0 %
ALPHA 1 URINE: 0 %
Albumin, U: 100 %
Alpha 2, Urine: 0 %
Free Kappa Lt Chains,Ur: 22 mg/L (ref 1.17–86.46)
Free Kappa/Lambda Ratio: 6.11 (ref 1.83–14.26)
Free Lambda Lt Chains,Ur: 3.6 mg/L (ref 0.27–15.21)
GAMMA GLOBULIN URINE: 0 %
Total Protein, Urine-Ur/day: 150 mg/(24.h) (ref 30–150)
Total Protein, Urine: 9.2 mg/dL
Total Volume: 1625

## 2023-09-24 LAB — FANA STAINING PATTERNS: Speckled Pattern: 24529

## 2023-09-24 LAB — ANTINUCLEAR ANTIBODIES, IFA: ANA Ab, IFA: POSITIVE — AB

## 2023-09-25 ENCOUNTER — Other Ambulatory Visit: Payer: Self-pay

## 2023-09-25 ENCOUNTER — Encounter: Payer: Self-pay | Admitting: Family Medicine

## 2023-09-25 MED ORDER — MORPHINE SULFATE ER 30 MG PO TBCR: 30.0000 mg | EXTENDED_RELEASE_TABLET | Freq: Three times a day (TID) | ORAL | 0 refills | Status: DC

## 2023-09-25 MED ORDER — OXYCODONE HCL 10 MG PO TABS: ORAL_TABLET | ORAL | 0 refills | Status: DC

## 2023-09-25 NOTE — Telephone Encounter (Addendum)
 Requesting: oxycodone  Contract: 10/31/21 UDS:02/08/22 Last Visit: 6/18/5 Next Visit: 10/03/23 Last Refill: 08/29/23 (90,0)   Requesting: morphine  Contract: 10/31/21 UDS: 02/08/22 Last Visit: 09/12/23 Next Visit: 10/03/23 Last Refill: 08/29/23 (90,0)  Please Advise. Rx pending

## 2023-10-02 ENCOUNTER — Ambulatory Visit (HOSPITAL_COMMUNITY)

## 2023-10-03 ENCOUNTER — Encounter: Payer: Self-pay | Admitting: Family Medicine

## 2023-10-03 ENCOUNTER — Inpatient Hospital Stay: Attending: Hematology | Admitting: Hematology

## 2023-10-03 ENCOUNTER — Ambulatory Visit: Admitting: Family Medicine

## 2023-10-03 VITALS — BP 134/77 | HR 115 | Temp 100.1°F | Resp 19 | Ht 69.0 in | Wt 249.8 lb

## 2023-10-03 VITALS — BP 132/75 | HR 94 | Temp 97.2°F | Ht 68.0 in | Wt 250.0 lb

## 2023-10-03 DIAGNOSIS — F411 Generalized anxiety disorder: Secondary | ICD-10-CM

## 2023-10-03 DIAGNOSIS — Z79899 Other long term (current) drug therapy: Secondary | ICD-10-CM

## 2023-10-03 DIAGNOSIS — Z7985 Long-term (current) use of injectable non-insulin antidiabetic drugs: Secondary | ICD-10-CM | POA: Insufficient documentation

## 2023-10-03 DIAGNOSIS — F5101 Primary insomnia: Secondary | ICD-10-CM

## 2023-10-03 DIAGNOSIS — R634 Abnormal weight loss: Secondary | ICD-10-CM | POA: Insufficient documentation

## 2023-10-03 DIAGNOSIS — L989 Disorder of the skin and subcutaneous tissue, unspecified: Secondary | ICD-10-CM | POA: Diagnosis not present

## 2023-10-03 DIAGNOSIS — D472 Monoclonal gammopathy: Secondary | ICD-10-CM | POA: Diagnosis not present

## 2023-10-03 DIAGNOSIS — L304 Erythema intertrigo: Secondary | ICD-10-CM | POA: Diagnosis not present

## 2023-10-03 DIAGNOSIS — K5730 Diverticulosis of large intestine without perforation or abscess without bleeding: Secondary | ICD-10-CM | POA: Diagnosis not present

## 2023-10-03 DIAGNOSIS — Z801 Family history of malignant neoplasm of trachea, bronchus and lung: Secondary | ICD-10-CM | POA: Diagnosis not present

## 2023-10-03 DIAGNOSIS — G894 Chronic pain syndrome: Secondary | ICD-10-CM

## 2023-10-03 DIAGNOSIS — M21371 Foot drop, right foot: Secondary | ICD-10-CM | POA: Insufficient documentation
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
## 2023-10-03 NOTE — Patient Instructions (Addendum)
 [facility] at [facility] Discharge Instructions   You were seen and examined today by Dr. Rogers.  He reviewed the results of your lab work were normal/stable.    We will see you back in 6 months. We will repeat lab work prior to this visit.    Return as scheduled.    Thank you for choosing [facility] at [facility] to provide your oncology and hematology care.  To afford each patient quality time with our provider, please arrive at least 15 minutes before your scheduled appointment time.   If you have a lab appointment with the Cancer Center please come in thru the Main Entrance and check in at the main information desk.  You need to re-schedule your appointment should you arrive 10 or more minutes late.  We strive to give you quality time with our providers, and arriving late affects you and other patients whose appointments are after yours.  Also, if you no show three or more times for appointments you may be dismissed from the clinic at the providers discretion.     Again, thank you for choosing [facility].  Our hope is that these requests will decrease the amount of time that you wait before being seen by our physicians.       _____________________________________________________________  Should you have questions after your visit to [facility], please contact our office at [phone] and follow the prompts.  Our office hours are 8:00 a.m. and 4:30 p.m. Monday - Friday.  Please note that voicemails left after 4:00 p.m. may not be returned until the following business day.  We are closed weekends and major holidays.  You do have access to a nurse 24-7, just call the main number to the clinic [phone] and do not press any options, hold on the line and a nurse will answer the phone.    For prescription refill requests, have your pharmacy contact our office and allow 72 hours.    Due to Covid, you will  need to wear a mask upon entering the hospital. If you do not have a mask, a mask will be given to you at the Main Entrance upon arrival. For doctor visits, patients may have 1 support person age 4 or older with them. For treatment visits, patients can not have anyone with them due to social distancing guidelines and our immunocompromised population.

## 2023-10-04 ENCOUNTER — Other Ambulatory Visit (HOSPITAL_COMMUNITY): Payer: Self-pay

## 2023-10-04 ENCOUNTER — Telehealth: Payer: Self-pay

## 2023-10-04 ENCOUNTER — Encounter: Payer: Self-pay | Admitting: Family Medicine

## 2023-10-04 NOTE — Telephone Encounter (Signed)
 Please assist with PA.

## 2023-10-04 NOTE — Telephone Encounter (Signed)
 Pharmacy Patient Advocate Encounter   Received notification from Patient Advice Request messages that prior authorization for Mounjaro  7.5MG /0.5ML auto-injectors  is required/requested.   Insurance verification completed.   The patient is insured through [facility] ADVANTAGE/RX ADVANCE .   Per test claim: PA required; PA submitted to above mentioned insurance via CoverMyMeds Key/confirmation #/EOC [id number] Status is pending

## 2023-10-04 NOTE — Telephone Encounter (Signed)
 Pharmacy Patient Advocate Encounter  Received notification from [facility] ADVANTAGE/RX ADVANCE that Prior Authorization for Mounjaro  7.5MG /0.5ML auto-injectors  has been APPROVED from 0610/2025 to 10/03/2024. Ran test claim, Copay is $47.00. This test claim was processed through [facility]- copay amounts may vary at other pharmacies due to pharmacy/plan contracts, or as the patient moves through the different stages of their insurance plan. SEE TEST CLAIM

## 2023-10-04 NOTE — Telephone Encounter (Signed)
 Noted

## 2023-10-05 ENCOUNTER — Telehealth: Payer: Self-pay

## 2023-10-05 NOTE — Telephone Encounter (Signed)
 document Home Health Certificate (Order ID [id number]), to be filled out by provider. Patient requested to send it back via Fax within 7-days. Document is located in providers tray at front office.

## 2023-10-05 NOTE — Telephone Encounter (Signed)
Placed on PCP desk to review and sign, if appropriate.  

## 2023-10-06 LAB — DRUG MONITORING PANEL 376104, URINE
Alphahydroxyalprazolam: NEGATIVE ng/mL (ref <25)
Alphahydroxymidazolam: NEGATIVE ng/mL (ref <50)
Alphahydroxytriazolam: NEGATIVE ng/mL (ref <50)
Aminoclonazepam: 667 ng/mL — ABNORMAL HIGH (ref <25)
Amphetamines: NEGATIVE ng/mL (ref <500)
Barbiturates: NEGATIVE ng/mL (ref <300)
Benzodiazepines: POSITIVE ng/mL — AB (ref <100)
Cocaine Metabolite: NEGATIVE ng/mL (ref <150)
Codeine: NEGATIVE ng/mL (ref <50)
Desmethyltramadol: NEGATIVE ng/mL (ref <100)
Hydrocodone: NEGATIVE ng/mL (ref <50)
Hydromorphone: 264 ng/mL — ABNORMAL HIGH (ref <50)
Hydroxyethylflurazepam: NEGATIVE ng/mL (ref <50)
Lorazepam: NEGATIVE ng/mL (ref <50)
Morphine: >10000 ng/mL — ABNORMAL HIGH (ref <50)
Nordiazepam: NEGATIVE ng/mL (ref <50)
Norhydrocodone: NEGATIVE ng/mL (ref <50)
Noroxycodone: 4636 ng/mL — ABNORMAL HIGH (ref <50)
Opiates: POSITIVE ng/mL — AB (ref <100)
Oxazepam: NEGATIVE ng/mL (ref <50)
Oxycodone: 1952 ng/mL — ABNORMAL HIGH (ref <50)
Oxycodone: POSITIVE ng/mL — AB (ref <100)
Oxymorphone: 1137 ng/mL — ABNORMAL HIGH (ref <50)
Temazepam: NEGATIVE ng/mL (ref <50)
Tramadol: NEGATIVE ng/mL (ref <100)

## 2023-10-06 LAB — DM TEMPLATE

## 2023-10-08 ENCOUNTER — Encounter: Payer: Self-pay | Admitting: Family Medicine

## 2023-10-26 ENCOUNTER — Other Ambulatory Visit: Payer: Self-pay | Admitting: Family Medicine

## 2023-10-26 ENCOUNTER — Encounter: Payer: Self-pay | Admitting: Family Medicine

## 2023-10-26 MED ORDER — OXYCODONE HCL 10 MG PO TABS: ORAL_TABLET | ORAL | 0 refills | Status: DC

## 2023-10-26 MED ORDER — MORPHINE SULFATE ER 30 MG PO TBCR: 30.0000 mg | EXTENDED_RELEASE_TABLET | Freq: Three times a day (TID) | ORAL | 0 refills | Status: DC

## 2023-10-26 NOTE — Telephone Encounter (Signed)
 Pt is requesting refills, last seen 10/03/23, CSC and UDS updated during appt.  Please fill, if appropriate. Rx pending for both

## 2023-10-26 NOTE — Telephone Encounter (Addendum)
 Pt sent mychart message requesting refills and message was forwarded to PCP. This is a duplicate, PCP sent refills.

## 2023-11-01 ENCOUNTER — Ambulatory Visit: Admitting: Family Medicine

## 2023-11-05 ENCOUNTER — Telehealth: Payer: Self-pay | Admitting: Pharmacist

## 2023-11-05 NOTE — Progress Notes (Signed)
 Pharmacy Quality Measure Review  This patient is appearing on a report for being at risk of failing the adherence measure for diabetes medications this calendar year.   Medication: Mounjaro  7.5 mg weekly Last fill date: 7/10 for 28 day supply  Patient reports he had been off Mounjaro  for a few months when he was sick, but has since restarted taking Mounjaro . Reports he continues to take every week, denies any issues.   No action needed at this time.   Catie [facility], PharmD, [facility] Clinical Pharmacist [phone]

## 2023-11-23 ENCOUNTER — Telehealth: Admitting: Family Medicine

## 2023-11-23 ENCOUNTER — Encounter: Payer: Self-pay | Admitting: Family Medicine

## 2023-11-23 VITALS — Wt 243.0 lb

## 2023-11-23 DIAGNOSIS — L304 Erythema intertrigo: Secondary | ICD-10-CM | POA: Diagnosis not present

## 2023-11-23 DIAGNOSIS — L0232 Furuncle of buttock: Secondary | ICD-10-CM | POA: Diagnosis not present

## 2023-11-23 MED ORDER — CEPHALEXIN 500 MG PO CAPS: 500.0000 mg | ORAL_CAPSULE | Freq: Three times a day (TID) | ORAL | 0 refills | Status: AC
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
## 2023-11-26 ENCOUNTER — Other Ambulatory Visit: Payer: Self-pay | Admitting: Family Medicine

## 2023-11-27 NOTE — Telephone Encounter (Signed)
 Requesting: morphine  Contract: 10/31/21 UDS: 02/08/22 Last Visit: 11/23/23 Next Visit: 12/05/23 Last Refill: 10/26/23 (90,0) 30 d/s  Please Advise. Rx pending

## 2023-11-29 ENCOUNTER — Encounter: Payer: Self-pay | Admitting: Family Medicine

## 2023-12-01 ENCOUNTER — Other Ambulatory Visit: Payer: Self-pay | Admitting: Family Medicine

## 2023-12-03 ENCOUNTER — Encounter: Payer: Self-pay | Admitting: Pharmacist

## 2023-12-03 NOTE — Progress Notes (Signed)
 Pharmacy Quality Measure Review  This patient is appearing on a report for being at risk of failing the adherence measure for diabetes medications this calendar year.   Medication: Mounjaro  Last fill date: 11/16/2023 for 28 day supply - Per adherence report last impactable date is 12/05/2023 but patient should not be due to refill Mounjaro  again until around 12/13/2023.   Reviewed refill history.Epic shows the following refills - 1/14, 2/20, 4/15, 5/2, 6/5, 7/10, 8/22 - but Dr Annemarie Database does not have a RF that was picked up on 4/15 due to this it looks like patient might have missed 80 days in 2025   [facility] to confirm refill dates and pick up. They show that Rx that was filled 07/10/2023 was returned to stock. Due to this patient has already failed MAD for 2025.   Catie Rudy, PharmD spoke to him in August and patient indicated that he had stopped Mounjaro  for awhile in 2025 when he was sick but he had restarted.  Will follow adherence in 2026.   Madelin Ray, PharmD Clinical Pharmacist [facility]  Population Health [phone]

## 2023-12-05 ENCOUNTER — Ambulatory Visit (INDEPENDENT_AMBULATORY_CARE_PROVIDER_SITE_OTHER): Admitting: Family Medicine

## 2023-12-05 ENCOUNTER — Encounter: Payer: Self-pay | Admitting: Family Medicine

## 2023-12-05 VITALS — BP 120/70 | HR 93 | Temp 98.1°F | Wt 244.0 lb

## 2023-12-05 DIAGNOSIS — Z7985 Long-term (current) use of injectable non-insulin antidiabetic drugs: Secondary | ICD-10-CM

## 2023-12-05 DIAGNOSIS — G894 Chronic pain syndrome: Secondary | ICD-10-CM | POA: Diagnosis not present

## 2023-12-05 DIAGNOSIS — R21 Rash and other nonspecific skin eruption: Secondary | ICD-10-CM

## 2023-12-05 DIAGNOSIS — E1143 Type 2 diabetes mellitus with diabetic autonomic (poly)neuropathy: Secondary | ICD-10-CM

## 2023-12-05 DIAGNOSIS — Z79899 Other long term (current) drug therapy: Secondary | ICD-10-CM | POA: Diagnosis not present

## 2023-12-05 DIAGNOSIS — E119 Type 2 diabetes mellitus without complications: Secondary | ICD-10-CM | POA: Diagnosis not present

## 2023-12-05 DIAGNOSIS — L304 Erythema intertrigo: Secondary | ICD-10-CM | POA: Diagnosis not present

## 2023-12-05 DIAGNOSIS — M170 Bilateral primary osteoarthritis of knee: Secondary | ICD-10-CM | POA: Diagnosis not present

## 2023-12-05 DIAGNOSIS — I10 Essential (primary) hypertension: Secondary | ICD-10-CM

## 2023-12-05 LAB — POCT GLYCOSYLATED HEMOGLOBIN (HGB A1C)
HbA1c POC (<> result, manual entry): 5.4 % (ref 4.0–5.6)
HbA1c, POC (controlled diabetic range): 5.4 % (ref 0.0–7.0)
HbA1c, POC (prediabetic range): 5.4 % — AB (ref 5.7–6.4)
Hemoglobin A1C: 5.4 % (ref 4.0–5.6)

## 2023-12-05 MED ORDER — IMIQUIMOD 5 % EX CREA: TOPICAL_CREAM | CUTANEOUS | 1 refills | Status: AC
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
## 2023-12-12 ENCOUNTER — Other Ambulatory Visit: Payer: Self-pay | Admitting: Family Medicine

## 2023-12-12 NOTE — Telephone Encounter (Signed)
 Requesting: Clonazepam  Contract: 10/31/21 UDS: 02/08/22 Last Visit: 12/05/23 Next Visit: 08/06/24 AWV Last Refill: 07/02/23 (60,5) rx expires on 12/29/23  Please Advise. Rx pending

## 2023-12-24 ENCOUNTER — Other Ambulatory Visit: Payer: Self-pay | Admitting: Family Medicine

## 2023-12-26 NOTE — Telephone Encounter (Signed)
 Requesting: morphine  Contract: 10/31/21 UDS: 02/08/22 Last Visit: 12/05/23 Next Visit: advised to f/u 6 weeks Last Refill: 11/27/23 (90,0)  Requesting: oxycodone  Contract: 10/31/21 UDS: 02/08/22 Last Visit: 12/05/23 Next Visit: advised to f/u 6 weeks Last Refill: 10/26/23 (90,0)  Please Advise. Meds pending

## 2024-01-23 ENCOUNTER — Ambulatory Visit: Admitting: Family Medicine

## 2024-01-24 ENCOUNTER — Other Ambulatory Visit: Payer: Self-pay | Admitting: Family Medicine

## 2024-01-24 ENCOUNTER — Encounter: Payer: Self-pay | Admitting: Family Medicine

## 2024-01-24 ENCOUNTER — Ambulatory Visit: Admitting: Family Medicine

## 2024-01-24 VITALS — BP 124/80 | HR 113 | Temp 98.5°F | Wt 239.2 lb

## 2024-01-24 DIAGNOSIS — Z0000 Encounter for general adult medical examination without abnormal findings: Secondary | ICD-10-CM

## 2024-01-24 DIAGNOSIS — F411 Generalized anxiety disorder: Secondary | ICD-10-CM

## 2024-01-24 DIAGNOSIS — T50905A Adverse effect of unspecified drugs, medicaments and biological substances, initial encounter: Secondary | ICD-10-CM | POA: Diagnosis not present

## 2024-01-24 DIAGNOSIS — Z125 Encounter for screening for malignant neoplasm of prostate: Secondary | ICD-10-CM

## 2024-01-24 DIAGNOSIS — I10 Essential (primary) hypertension: Secondary | ICD-10-CM | POA: Diagnosis not present

## 2024-01-24 DIAGNOSIS — Z7985 Long-term (current) use of injectable non-insulin antidiabetic drugs: Secondary | ICD-10-CM

## 2024-01-24 DIAGNOSIS — E7800 Pure hypercholesterolemia, unspecified: Secondary | ICD-10-CM | POA: Diagnosis not present

## 2024-01-24 DIAGNOSIS — Z23 Encounter for immunization: Secondary | ICD-10-CM

## 2024-01-24 DIAGNOSIS — E1142 Type 2 diabetes mellitus with diabetic polyneuropathy: Secondary | ICD-10-CM

## 2024-01-24 DIAGNOSIS — G894 Chronic pain syndrome: Secondary | ICD-10-CM | POA: Diagnosis not present

## 2024-01-24 DIAGNOSIS — Z79899 Other long term (current) drug therapy: Secondary | ICD-10-CM

## 2024-01-24 LAB — COMPREHENSIVE METABOLIC PANEL WITH GFR
ALT: 12 U/L (ref 0–53)
AST: 14 U/L (ref 0–37)
Albumin: 4.3 g/dL (ref 3.5–5.2)
Alkaline Phosphatase: 53 U/L (ref 39–117)
BUN: 20 mg/dL (ref 6–23)
CO2: 31 meq/L (ref 19–32)
Calcium: 9.7 mg/dL (ref 8.4–10.5)
Chloride: 93 meq/L — ABNORMAL LOW (ref 96–112)
Creatinine, Ser: 1.25 mg/dL (ref 0.40–1.50)
GFR: 58.04 mL/min — ABNORMAL LOW (ref >60.00)
Glucose, Bld: 95 mg/dL (ref 70–99)
Potassium: 4.2 meq/L (ref 3.5–5.1)
Sodium: 135 meq/L (ref 135–145)
Total Bilirubin: 0.8 mg/dL (ref 0.2–1.2)
Total Protein: 7.4 g/dL (ref 6.0–8.3)

## 2024-01-24 LAB — LIPID PANEL
Cholesterol: 218 mg/dL — ABNORMAL HIGH (ref 0–200)
HDL: 57.6 mg/dL (ref >39.00)
LDL Cholesterol: 133 mg/dL — ABNORMAL HIGH (ref 0–99)
NonHDL: 160.02
Total CHOL/HDL Ratio: 4
Triglycerides: 136 mg/dL (ref 0.0–149.0)
VLDL: 27.2 mg/dL (ref 0.0–40.0)

## 2024-01-24 LAB — PSA, MEDICARE: PSA: 0.7 ng/mL (ref 0.10–4.00)

## 2024-01-24 MED ORDER — HYDROCHLOROTHIAZIDE 25 MG PO TABS: 25.0000 mg | ORAL_TABLET | Freq: Every day | ORAL | 1 refills | Status: AC

## 2024-01-24 MED ORDER — MORPHINE SULFATE ER 30 MG PO TBCR: 30.0000 mg | EXTENDED_RELEASE_TABLET | Freq: Three times a day (TID) | ORAL | 0 refills | Status: DC

## 2024-01-24 MED ORDER — OXYCODONE HCL 10 MG PO TABS: ORAL_TABLET | ORAL | 0 refills | Status: DC

## 2024-01-24 MED ORDER — TRAZODONE HCL 50 MG PO TABS: ORAL_TABLET | ORAL | 5 refills | Status: AC

## 2024-01-24 MED ORDER — LISINOPRIL 10 MG PO TABS: 10.0000 mg | ORAL_TABLET | Freq: Every day | ORAL | 1 refills | Status: AC
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
## 2024-01-24 NOTE — Telephone Encounter (Signed)
 Meds sent during OV 10/30

## 2024-01-26 ENCOUNTER — Encounter: Payer: Self-pay | Admitting: Family Medicine

## 2024-01-27 ENCOUNTER — Ambulatory Visit: Payer: Self-pay | Admitting: Family Medicine

## 2024-01-28 MED ORDER — ATORVASTATIN CALCIUM 20 MG PO TABS: 20.0000 mg | ORAL_TABLET | Freq: Every day | ORAL | 2 refills | Status: AC

## 2024-01-28 NOTE — Telephone Encounter (Signed)
 Hi Dale,  Electrolyte solution only needed if you feel like you are dehydrated.  I think it is okay to take a third clonazepam  on some days--> lets try to limit it to 3 total extra tabs per week. When you request your next refill I will change the instructions and the number to dispense per month to 72.  --PM

## 2024-01-28 NOTE — Telephone Encounter (Signed)
 No further action needed at this time. MyChart message read by pt

## 2024-02-11 ENCOUNTER — Encounter: Payer: Self-pay | Admitting: Family Medicine

## 2024-02-11 ENCOUNTER — Telehealth: Admitting: Family Medicine

## 2024-02-11 VITALS — BP 123/80 | Ht 69.0 in | Wt 236.0 lb

## 2024-02-11 DIAGNOSIS — Z79899 Other long term (current) drug therapy: Secondary | ICD-10-CM

## 2024-02-11 DIAGNOSIS — F5101 Primary insomnia: Secondary | ICD-10-CM

## 2024-02-11 DIAGNOSIS — F411 Generalized anxiety disorder: Secondary | ICD-10-CM

## 2024-02-11 DIAGNOSIS — E7800 Pure hypercholesterolemia, unspecified: Secondary | ICD-10-CM

## 2024-02-11 MED ORDER — CLONAZEPAM 0.5 MG PO TABS
0.5000 mg | ORAL_TABLET | Freq: Three times a day (TID) | ORAL | 5 refills | Status: AC | PRN
Start: 2024-02-11 — End: ?

## 2024-02-11 MED ORDER — MOUNJARO 7.5 MG/0.5ML ~~LOC~~ SOAJ: 7.5000 mg | SUBCUTANEOUS | 5 refills | Status: AC
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
## 2024-02-22 ENCOUNTER — Other Ambulatory Visit: Payer: Self-pay | Admitting: Family Medicine

## 2024-02-25 ENCOUNTER — Other Ambulatory Visit: Payer: Self-pay

## 2024-02-25 MED ORDER — MORPHINE SULFATE ER 30 MG PO TBCR: 30.0000 mg | EXTENDED_RELEASE_TABLET | Freq: Three times a day (TID) | ORAL | 0 refills | Status: DC

## 2024-02-25 MED ORDER — OXYCODONE HCL 10 MG PO TABS: ORAL_TABLET | ORAL | 0 refills | Status: DC

## 2024-02-25 NOTE — Telephone Encounter (Signed)
 Requesting: oxycodone  Contract:10/01/23 UDS:10/03/23 Last Visit:01/24/24 Next Visit:08/06/24 Last Refill:01/24/24 (90,0)  Please Advise

## 2024-02-25 NOTE — Telephone Encounter (Signed)
 Requesting: morphine  Contract: 10/03/23 UDS: 10/03/23 Last Visit: 01/24/24 Next Visit: AWV 08/06/24 Last Refill: 01/24/24 (90,0)  Requesting: oxycodone  Contract: 10/03/23 UDS: 10/03/23 Last Visit: 01/24/24 Next Visit: AWV 08/06/24 Last Refill: 01/24/24 (90,0)  Please Advise. Rx pending

## 2024-03-15 ENCOUNTER — Other Ambulatory Visit: Payer: Self-pay | Admitting: Family Medicine

## 2024-03-24 ENCOUNTER — Other Ambulatory Visit: Payer: Self-pay | Admitting: Family Medicine

## 2024-03-24 NOTE — Telephone Encounter (Signed)
 Requesting: oxycodone  Contract: 10/03/23 UDS: 10/03/23 Last Visit: 01/24/24 Next Visit: AWV 08/07/23 Last Refill: 02/25/24 (90,0)  Requesting: morphine   Contract: UDS: Last Visit: 01/24/24 Next Visit: AWV 08/07/23 Last Refill: 02/25/24 (90,0)  Please Advise. Rx pending

## 2024-03-25 NOTE — Telephone Encounter (Signed)
"  No further action needed at this time.  "

## 2024-04-01 ENCOUNTER — Inpatient Hospital Stay

## 2024-04-08 ENCOUNTER — Inpatient Hospital Stay: Admitting: Physician Assistant

## 2024-04-12 ENCOUNTER — Encounter: Payer: Self-pay | Admitting: Family Medicine

## 2024-04-14 ENCOUNTER — Other Ambulatory Visit (HOSPITAL_COMMUNITY): Payer: Self-pay

## 2024-04-14 NOTE — Telephone Encounter (Signed)
 Is this something you can assist with?

## 2024-04-15 NOTE — Telephone Encounter (Signed)
 Unfortunately there has not been any other medication that Richard Levine has tried and failed for his diabetes or his weight loss.

## 2024-04-15 NOTE — Telephone Encounter (Signed)
MyChart message read by pt. No further action needed.

## 2024-04-15 NOTE — Telephone Encounter (Signed)
 Tier exception forms printed and placed on PCP desk for review.

## 2024-04-22 ENCOUNTER — Other Ambulatory Visit: Payer: Self-pay | Admitting: Family Medicine

## 2024-04-22 NOTE — Telephone Encounter (Signed)
 Requesting: oxycodone  Contract: 10/03/23 UDS: 10/03/23 Last Visit: 01/24/24 Next Visit: 04/24/24 Last Refill: 03/24/24 (90,0)   Requesting: morphine   Contract: 10/03/23 UDS: 10/03/23 Last Visit: 01/24/24 Next Visit: 04/24/24 Last Refill: 03/24/24 (90,0)   Please Advise. Rx pending

## 2024-04-24 ENCOUNTER — Ambulatory Visit: Admitting: Family Medicine

## 2024-04-28 ENCOUNTER — Ambulatory Visit: Admitting: Family Medicine

## 2024-04-30 NOTE — Progress Notes (Signed)
 Richard Levine                                          MRN: [id number]   04/30/2024   The [facility] Quality Team Specialist reviewed this patient medical record for the purposes of chart review for care gap closure. The following were reviewed: chart review for care gap closure-kidney health evaluation for diabetes:eGFR  and uACR.    [facility] Quality Team

## 2024-05-01 ENCOUNTER — Other Ambulatory Visit: Payer: Self-pay | Admitting: Family Medicine

## 2024-05-01 NOTE — Telephone Encounter (Signed)
 Pt has scheduled appt tomorrow

## 2024-05-02 ENCOUNTER — Ambulatory Visit: Admitting: Family Medicine

## 2024-05-05 ENCOUNTER — Ambulatory Visit: Admitting: Family Medicine

## 2024-08-06 ENCOUNTER — Ambulatory Visit

## 2024-09-03 ENCOUNTER — Ambulatory Visit: Admitting: Neurology
